# Patient Record
Sex: Male | Born: 1935 | Race: White | Hispanic: No | Marital: Married | Smoking: Never smoker
Health system: Southern US, Community
[De-identification: ages and names within clinical notes are randomized; demographics above are authoritative.]

## PROBLEM LIST (undated history)

## (undated) DIAGNOSIS — I10 Essential (primary) hypertension: Secondary | ICD-10-CM

## (undated) DIAGNOSIS — H9190 Unspecified hearing loss, unspecified ear: Secondary | ICD-10-CM

## (undated) DIAGNOSIS — I499 Cardiac arrhythmia, unspecified: Secondary | ICD-10-CM

## (undated) DIAGNOSIS — I4891 Unspecified atrial fibrillation: Secondary | ICD-10-CM

## (undated) DIAGNOSIS — T4145XA Adverse effect of unspecified anesthetic, initial encounter: Secondary | ICD-10-CM

## (undated) DIAGNOSIS — K4090 Unilateral inguinal hernia, without obstruction or gangrene, not specified as recurrent: Secondary | ICD-10-CM

## (undated) DIAGNOSIS — I482 Chronic atrial fibrillation, unspecified: Secondary | ICD-10-CM

## (undated) DIAGNOSIS — Z950 Presence of cardiac pacemaker: Secondary | ICD-10-CM

## (undated) DIAGNOSIS — I739 Peripheral vascular disease, unspecified: Secondary | ICD-10-CM

## (undated) DIAGNOSIS — H3530 Unspecified macular degeneration: Secondary | ICD-10-CM

## (undated) DIAGNOSIS — R351 Nocturia: Secondary | ICD-10-CM

## (undated) DIAGNOSIS — C61 Malignant neoplasm of prostate: Secondary | ICD-10-CM

## (undated) HISTORY — DX: Unspecified macular degeneration: H35.30

## (undated) HISTORY — PX: OTHER SURGICAL HISTORY: SHX169

## (undated) HISTORY — DX: Peripheral vascular disease, unspecified: I73.9

## (undated) HISTORY — PX: TONSILLECTOMY: SUR1361

## (undated) HISTORY — DX: Chronic atrial fibrillation, unspecified: I48.20

## (undated) HISTORY — PX: HERNIA REPAIR: SHX51

## (undated) HISTORY — DX: Malignant neoplasm of prostate: C61

## (undated) HISTORY — DX: Essential (primary) hypertension: I10

## (undated) HISTORY — PX: CATARACT EXTRACTION: SUR2

## (undated) HISTORY — PX: CARDIOVERSION: SHX1299

## (undated) HISTORY — DX: Nocturia: R35.1

## (undated) HISTORY — DX: Unilateral inguinal hernia, without obstruction or gangrene, not specified as recurrent: K40.90

---
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
## 1958-03-17 HISTORY — PX: THYROIDECTOMY, PARTIAL: SHX18

## 1988-03-17 DIAGNOSIS — I10 Essential (primary) hypertension: Secondary | ICD-10-CM

## 1988-03-17 HISTORY — DX: Essential (primary) hypertension: I10

## 1996-03-17 DIAGNOSIS — T8859XA Other complications of anesthesia, initial encounter: Secondary | ICD-10-CM | POA: Diagnosis present

## 1996-03-17 DIAGNOSIS — C61 Malignant neoplasm of prostate: Secondary | ICD-10-CM

## 1996-03-17 HISTORY — DX: Other complications of anesthesia, initial encounter: T88.59XA

## 1996-03-17 HISTORY — DX: Malignant neoplasm of prostate: C61

## 2005-11-26 ENCOUNTER — Ambulatory Visit (HOSPITAL_COMMUNITY): Admission: RE | Admit: 2005-11-26 | Discharge: 2005-11-26 | Payer: Self-pay | Admitting: Ophthalmology

## 2008-08-15 ENCOUNTER — Emergency Department (HOSPITAL_COMMUNITY): Admission: EM | Admit: 2008-08-15 | Discharge: 2008-08-15 | Payer: Self-pay | Admitting: Family Medicine

## 2009-07-26 HISTORY — PX: US ECHOCARDIOGRAPHY: HXRAD669

## 2009-08-14 ENCOUNTER — Inpatient Hospital Stay (HOSPITAL_COMMUNITY): Admission: RE | Admit: 2009-08-14 | Discharge: 2009-08-15 | Payer: Self-pay | Admitting: Cardiology

## 2009-08-14 HISTORY — PX: INSERT / REPLACE / REMOVE PACEMAKER: SUR710

## 2009-12-01 ENCOUNTER — Encounter: Payer: Self-pay | Admitting: Internal Medicine

## 2009-12-05 ENCOUNTER — Ambulatory Visit: Payer: Self-pay | Admitting: Cardiology

## 2009-12-20 ENCOUNTER — Ambulatory Visit: Payer: Self-pay | Admitting: Cardiology

## 2010-04-16 NOTE — Miscellaneous (Signed)
Summary: Device preload  Clinical Lists Changes  Observations: Added new observation of PPM INDICATN: Brady A-fib (12/01/2009 15:22) Added new observation of MAGNET RTE: BOL 85 ERI 65 (12/01/2009 15:22) Added new observation of PPMLEADSTAT1: active (12/01/2009 15:22) Added new observation of PPMLEADSER1: 016010  (12/01/2009 15:22) Added new observation of PPMLEADMOD1: 4471  (12/01/2009 15:22) Added new observation of PPMLEADDOI1: 08/14/2009  (12/01/2009 15:22) Added new observation of PPMLEADLOC1: RV  (12/01/2009 15:22) Added new observation of PPM IMP MD: Duffy Rhody Tennant,MD  (12/01/2009 15:22) Added new observation of PPM DOI: 08/14/2009  (12/01/2009 15:22) Added new observation of PPM SERL#: [id number]  (12/01/2009 15:22) Added new observation of PPM MODL#: [id number]  (12/01/2009 20:25) Added new observation of PACEMAKERMFG: Medtronic  (12/01/2009 15:22) Added new observation of PPM REFER MD: Duffy Rhody Tennant,MD  (12/01/2009 15:22) Added new observation of PACEMAKER MD: Sherryl Manges, MD  (12/01/2009 15:22)      PPM Specifications Following MD:  Sherryl Manges, MD     Referring MD:  Rolla Plate PPM Vendor:  Medtronic     PPM Model Number:  [id number]     PPM Serial Number:  [id number] PPM DOI:  08/14/2009     PPM Implanting MD:  Rolla Plate  Lead 1    Location: RV     DOI: 08/14/2009     Model #: [id number]     Serial #: [id number]     Status: active  Magnet Response Rate:  BOL 85 ERI 65  Indications:  Manuel Lewis

## 2010-06-03 LAB — SURGICAL PCR SCREEN: MRSA, PCR: NEGATIVE

## 2010-06-03 LAB — PROTIME-INR
INR: 1.43 (ref 0.00–1.49)
INR: 1.43 (ref 0.00–1.49)
Prothrombin Time: 17.3 seconds — ABNORMAL HIGH (ref 11.6–15.2)

## 2010-08-02 NOTE — Op Note (Signed)
NAME:  Manuel Lewis, Manuel Lewis               ACCOUNT NO.:  [id number]   MEDICAL RECORD NO.:  [id number]          PATIENT TYPE:  AMB   LOCATION:  SDS                          FACILITY:  [facility]   PHYSICIAN:  Alford Highland. Rankin, M.D.   DATE OF BIRTH:  04-15-1921   DATE OF PROCEDURE:  11/26/2005  DATE OF DISCHARGE:                                 OPERATIVE REPORT   PREOPERATIVE DIAGNOSIS:  Epiretinal membrane, right eye, with visually  significant symptoms.   POSTOPERATIVE DIAGNOSES:  1. Epiretinal membrane, right eye, with visually significant symptoms.  2. Asteroid hyalosis.  3. Posterior capsule opacity, right eye.   PROCEDURES:  1. Posterior vitrectomy with membrane peel - internal limiting membrane,      right eye, 25 gauge.  2. Surgical posterior capsulotomy, right eye.   SURGEON:  Alford Highland. Rankin, M.D.   ANESTHESIA:  Local retrobulbar with monitored anesthesia control.   INDICATIONS FOR PROCEDURE:  The patient is an 77 year old man with gradually  progressive epiretinal membrane, with macular topographic distortion and  inducement of visual acuity symptoms.  The patient understands this is an  attempt to remove the epiretinal membranes so as to allow the best visual  acuity improvement and enhancement of his vision.  The patient understands  the risks of anesthesia, including risks of death, and also those including  but not limited to from the surgical disease, surgical processes, as well as  underlying disease, hemorrhage, infection, scarring, need for further  surgery, no change in vision, loss of vision, progression of disease despite  intervention.  Appropriate signed consent was obtained.   DESCRIPTION OF PROCEDURE:  The patient was taken to the operating room.  In  the operating room, appropriate monitoring was followed by mild sedation.  Marcaine 0.75% was delivered, 5 cc retrobulbar, followed by an additional 5  cc, the latter in the fashion of a modified [employer]  The right  periocular  region was sterilely prepped and draped in the usual ophthalmic fashion.  A  lid speculum was applied.  A 25-gauge trocar system was used to place the  infusion.  Superior trocars were applied.  Core vitrectomy was then begun.  Physician-induced hyaloid detachment was necessary, as now the patient also  had mild asteroid.  Vitreous was then elevated anteriorly for 360 degrees.  No retinal holes or detachments were identified.  A 25-gauge forceps was  then used to engage the internal limiting membrane separately, which was  removed in a continuous sheet off the macular region without difficulty or  complication.  No complications occurred.  At this time, the peripheral  retina was inspected and found to be free of retinal holes or tears.  The  instruments were removed from the eye.  Vitreous herniation into the trocars  was carefully transected with the vitrectomy instrumentation.  The trocars  were removed.  Intraocular pressure was  assessed and found to be adequate.  The infusion was removed.  Subconjunctival injection of Decadron was applied.  The right periocular  region had a sterile patch and a Fox shield applied.  The patient was taken  to the PACU in good and stable condition.      Alford Highland Rankin, M.D.  Electronically Signed     GAR/MEDQ  D:  11/26/2005  T:  11/26/2005  Job:  [id number]   cc:   Etter Sjogren, M.D.

## 2010-08-05 ENCOUNTER — Encounter: Payer: Self-pay | Admitting: Internal Medicine

## 2010-08-06 ENCOUNTER — Ambulatory Visit (INDEPENDENT_AMBULATORY_CARE_PROVIDER_SITE_OTHER): Payer: Medicare Other | Admitting: Internal Medicine

## 2010-08-06 ENCOUNTER — Encounter: Payer: Self-pay | Admitting: Internal Medicine

## 2010-08-06 DIAGNOSIS — R001 Bradycardia, unspecified: Secondary | ICD-10-CM

## 2010-08-06 DIAGNOSIS — Z950 Presence of cardiac pacemaker: Secondary | ICD-10-CM | POA: Insufficient documentation

## 2010-08-06 DIAGNOSIS — I498 Other specified cardiac arrhythmias: Secondary | ICD-10-CM

## 2010-08-06 DIAGNOSIS — I10 Essential (primary) hypertension: Secondary | ICD-10-CM

## 2010-08-06 DIAGNOSIS — I4891 Unspecified atrial fibrillation: Secondary | ICD-10-CM

## 2010-08-06 NOTE — Patient Instructions (Signed)
Your physician recommends that you schedule a follow-up appointment in:  6 months pacer clinic  Your physician wants you to follow-up in:  MARCH 2013 with Dr.Klein for pacer check You will receive a reminder letter in the mail two months in advance. If you don't receive a letter, please call our office to schedule the follow-up appointment.

## 2010-08-06 NOTE — Assessment & Plan Note (Signed)
The patient's device was interrogated.  The information was reviewed. No changes were made in the programming.    

## 2010-08-06 NOTE — Assessment & Plan Note (Signed)
95% ventricularly paced; he is actually in the VVI mode quite surprisingly. He has no limitations of activity

## 2010-08-06 NOTE — Assessment & Plan Note (Signed)
Permanent on anticoagulation 

## 2010-08-06 NOTE — Assessment & Plan Note (Signed)
Markedly elevated today on both initial check and recheck. According to him and his wife this is a normal as. He is to see his PCP next week. They will record blood pressures in the interim.

## 2010-08-06 NOTE — Progress Notes (Signed)
HPI: Manuel Lewis is a 77 y.o. male Seen to establish pacemaker care for single-chamber device implanted in May 2011 for bradycardia in the setting of permanent atrial fibrillation. Thromboembolic risk factors are notable for age-89 and hypertension for a CHADS VASC score of 3.  He is on Coumadin The patient denies chest pain, shortness of breath, nocturnal dyspnea, orthopnea or peripheral edema.  There have been no palpitations, lightheadedness or syncope.   Current Outpatient Prescriptions  Medication Sig Dispense Refill  . acetaminophen (TYLENOL) 500 MG tablet Take 500 mg by mouth as needed.        Marland Kitchen amLODipine (NORVASC) 10 MG tablet Take 10 mg by mouth daily.        . Cholecalciferol (VITAMIN D) 400 UNITS capsule Take 400 Units by mouth daily.        Marland Kitchen docusate sodium (COLACE) 100 MG capsule 1 tab daily       . hydrochlorothiazide (,MICROZIDE/HYDRODIURIL,) 12.5 MG capsule Take 12.5 mg by mouth daily.        Marland Kitchen losartan (COZAAR) 100 MG tablet Take 100 mg by mouth daily.        . Multiple Vitamin (MULTIVITAMIN) tablet Take 1 tablet by mouth daily.        . Multiple Vitamins-Minerals (OCUVITE PRESERVISION) TABS Take 2 tablets by mouth daily.        . Warfarin Sodium (COUMADIN PO) Take 2 mg by mouth as directed.        Marland Kitchen DISCONTD: amLODipine-valsartan (EXFORGE) 10-320 MG per tablet Take 1 tablet by mouth daily.        Marland Kitchen DISCONTD: felodipine (PLENDIL) 10 MG 24 hr tablet Take 10 mg by mouth daily.        Marland Kitchen DISCONTD: lisinopril-hydrochlorothiazide (PRINZIDE,ZESTORETIC) 20-12.5 MG per tablet Take 1 tablet by mouth 2 (two) times daily.          Allergies no known allergies  Past Medical History  Diagnosis Date  . Nocturia   . Macular degeneration   . Hypertension 1990  . Chronic a-fib     since early 1990's with profound bradycardia and pauses; s/p  implantation  of Medtronic Adapta [id number], serial # [id number]  . Prostate cancer 1998    with seed implantation    Past Surgical History    Procedure Date  . Appendectomy 1941  . Hernia repair     1945 and 1985  . Thyroidectomy, partial 1960    benign nodule  . Cataract extraction     2000 and 2007/ h/o macular degeneration since 2009  . Cardioversion     IN THE 1990's  . Insert / replace / remove pacemaker 08/14/09    IMPLANTATION OF SINGLE CHAMBER PULSE  GENERATING SYSTEM;   MODEL ADSR01, SERIAL # [id number] H ADAPTA    Family History  Problem Relation Age of Onset  . Stroke Mother   . Kidney failure Father     History   Social History  . Marital Status: Married    Spouse Name: N/A    Number of Children: 1  . Years of Education: N/A   Occupational History  . RETIRED [employer]    ENGINEER   Social History Main Topics  . Smoking status: Never Smoker   . Smokeless tobacco: Not on file  . Alcohol Use: Yes     1 GLASS OF WINE NIGHTLY  . Drug Use: No  . Sexually Active:    Other Topics Concern  . Not on file   Social History Narrative  MARRIEDSON IS DR. 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   Well developed and nourished in no acute distress HENT normal Neck supple with JVP-flat Carotids brisk and full without bruits Back without scoliosis or kyphosis Clear Regular rate and rhythm, no murmurs or gallops Abd-soft with active BS without hepatomegaly or midline pulsation Femoral pulses 2+ distal pulses intact No Clubbing cyanosis edema Skin-warm and dry LN-neg submandibular and supraclavicular A & Oriented CN 3-12 normal  Grossly normal sensory and motor function Affect engaging .

## 2011-03-25 DIAGNOSIS — Z7901 Long term (current) use of anticoagulants: Secondary | ICD-10-CM | POA: Diagnosis not present

## 2011-04-22 DIAGNOSIS — Z7901 Long term (current) use of anticoagulants: Secondary | ICD-10-CM | POA: Diagnosis not present

## 2011-04-22 DIAGNOSIS — M949 Disorder of cartilage, unspecified: Secondary | ICD-10-CM | POA: Diagnosis not present

## 2011-04-22 DIAGNOSIS — M899 Disorder of bone, unspecified: Secondary | ICD-10-CM | POA: Diagnosis not present

## 2011-04-25 DIAGNOSIS — Z7901 Long term (current) use of anticoagulants: Secondary | ICD-10-CM | POA: Diagnosis not present

## 2011-05-02 DIAGNOSIS — Z7901 Long term (current) use of anticoagulants: Secondary | ICD-10-CM | POA: Diagnosis not present

## 2011-05-14 DIAGNOSIS — H353290 Exudative age-related macular degeneration, unspecified eye, stage unspecified: Secondary | ICD-10-CM | POA: Diagnosis not present

## 2011-05-14 DIAGNOSIS — H35359 Cystoid macular degeneration, unspecified eye: Secondary | ICD-10-CM | POA: Diagnosis not present

## 2011-05-14 DIAGNOSIS — H35059 Retinal neovascularization, unspecified, unspecified eye: Secondary | ICD-10-CM | POA: Diagnosis not present

## 2011-05-16 DIAGNOSIS — Z7901 Long term (current) use of anticoagulants: Secondary | ICD-10-CM | POA: Diagnosis not present

## 2011-05-30 DIAGNOSIS — Z7901 Long term (current) use of anticoagulants: Secondary | ICD-10-CM | POA: Diagnosis not present

## 2011-06-03 ENCOUNTER — Encounter: Payer: Self-pay | Admitting: *Deleted

## 2011-06-04 ENCOUNTER — Encounter: Payer: Self-pay | Admitting: Internal Medicine

## 2011-06-04 ENCOUNTER — Ambulatory Visit (INDEPENDENT_AMBULATORY_CARE_PROVIDER_SITE_OTHER): Payer: Medicare Other | Admitting: Internal Medicine

## 2011-06-04 VITALS — BP 169/95 | HR 59 | Ht 70.0 in | Wt 160.0 lb

## 2011-06-04 DIAGNOSIS — Z950 Presence of cardiac pacemaker: Secondary | ICD-10-CM | POA: Diagnosis not present

## 2011-06-04 DIAGNOSIS — I498 Other specified cardiac arrhythmias: Secondary | ICD-10-CM | POA: Diagnosis not present

## 2011-06-04 DIAGNOSIS — I10 Essential (primary) hypertension: Secondary | ICD-10-CM

## 2011-06-04 DIAGNOSIS — R001 Bradycardia, unspecified: Secondary | ICD-10-CM

## 2011-06-04 DIAGNOSIS — I4891 Unspecified atrial fibrillation: Secondary | ICD-10-CM

## 2011-06-04 LAB — PACEMAKER DEVICE OBSERVATION
BMOD-0003RV: 30
BMOD-0005RV: 95 {beats}/min
RV LEAD AMPLITUDE: 11.2 mv
RV LEAD IMPEDENCE PM: 563 Ohm
VENTRICULAR PACING PM: 95

## 2011-06-04 NOTE — Assessment & Plan Note (Signed)
95% ventricularly paced with a blunted heart rate histogram; however, the patient is not inclined to haveus  Reprogram his device

## 2011-06-04 NOTE — Progress Notes (Signed)
  HPI  Manuel Lewis is a 78 y.o. male Seen to establish pacemaker care for single-chamber device implanted in May 2011 for bradycardia in the setting of permanent atrial fibrillation. Thromboembolic risk factors are notable for age-4 and hypertension for a CHADS VASC score of 3. She is on Coumadin   Past Medical History  Diagnosis Date  . Nocturia   . Macular degeneration   . Hypertension 1990  . Chronic a-fib     since early 1990's with profound bradycardia and pauses; s/p  implantation  of Medtronic Adapta [id number], serial # [id number]  . Prostate cancer 1998    with seed implantation    Past Surgical History  Procedure Date  . Appendectomy 1941  . Hernia repair     1945 and 1985  . Thyroidectomy, partial 1960    benign nodule  . Cataract extraction     2000 and 2007/ h/o macular degeneration since 2009  . Cardioversion     IN THE 1990's  . Insert / replace / remove pacemaker 08/14/09    IMPLANTATION OF SINGLE CHAMBER PULSE  GENERATING SYSTEM;   MODEL ADSR01, SERIAL # [id number] H ADAPTA  . US echocardiography 07/26/2009    EF 55-60%    Current Outpatient Prescriptions  Medication Sig Dispense Refill  . acetaminophen (TYLENOL) 500 MG tablet Take 500 mg by mouth as needed.        Marland Kitchen amLODipine (NORVASC) 10 MG tablet Take 10 mg by mouth daily.        . Cholecalciferol (VITAMIN D) 400 UNITS capsule Take 400 Units by mouth daily.        Marland Kitchen docusate sodium (COLACE) 100 MG capsule 1 tab daily       . hydrochlorothiazide (,MICROZIDE/HYDRODIURIL,) 12.5 MG capsule Take 12.5 mg by mouth daily.        Marland Kitchen losartan (COZAAR) 100 MG tablet Take 100 mg by mouth daily.        . Multiple Vitamin (MULTIVITAMIN) tablet Take 1 tablet by mouth daily.        . Multiple Vitamins-Minerals (OCUVITE PRESERVISION) TABS Take 2 tablets by mouth daily.        . Warfarin Sodium (COUMADIN PO) Take 2 mg by mouth as directed.          Allergies no known allergies  Review of Systems negative except from HPI  and PMH  Physical Exam BP 169/95  Pulse 59  Ht 5\' 10"  (1.778 m)  Wt 160 lb (72.576 kg)  BMI 22.96 kg/m2 Well developed and well nourished in no acute distress HENT normal E scleral and icterus clear Neck Supple JVP flat; carotids brisk and full Clear to ausculation Regular rate and rhythm, no murmurs gallops or rub Soft with active bowel sounds No clubbing cyanosis none Edema Alert and oriented, grossly normal motor and sensory function Skin Warm and Dry   Assessment and  Plan

## 2011-06-04 NOTE — Patient Instructions (Signed)
Your physician wants you to follow-up in: 6 months with Kristin/Paula for a device check & 1 year with Dr. Klein. You will receive a reminder letter in the mail two months in advance. If you don't receive a letter, please call our office to schedule the follow-up appointment.  Your physician recommends that you continue on your current medications as directed. Please refer to the Current Medication list given to you today.  

## 2011-06-04 NOTE — Assessment & Plan Note (Addendum)
permanent  on coumadin

## 2011-06-04 NOTE — Assessment & Plan Note (Signed)
He says that this is whitecoat hypertension; I've asked that the track her blood pressures at home and being follow up with her PCP if systolics are ranging over 140

## 2011-06-26 DIAGNOSIS — Z7901 Long term (current) use of anticoagulants: Secondary | ICD-10-CM | POA: Diagnosis not present

## 2011-07-09 DIAGNOSIS — C44319 Basal cell carcinoma of skin of other parts of face: Secondary | ICD-10-CM | POA: Diagnosis not present

## 2011-07-18 DIAGNOSIS — C44319 Basal cell carcinoma of skin of other parts of face: Secondary | ICD-10-CM | POA: Diagnosis not present

## 2011-07-18 DIAGNOSIS — D210 Benign neoplasm of connective and other soft tissue of head, face and neck: Secondary | ICD-10-CM | POA: Diagnosis not present

## 2011-07-24 DIAGNOSIS — Z7901 Long term (current) use of anticoagulants: Secondary | ICD-10-CM | POA: Diagnosis not present

## 2011-07-28 ENCOUNTER — Telehealth (INDEPENDENT_AMBULATORY_CARE_PROVIDER_SITE_OTHER): Payer: Self-pay | Admitting: General Surgery

## 2011-07-28 NOTE — Telephone Encounter (Signed)
Pt's wife calling to ask if possible to be seen sooner than appt on 09/01/11, and husband is having increasing pain with Lt. Inguinal hernia.  Please call them is something becomes available.

## 2011-07-31 DIAGNOSIS — Z7901 Long term (current) use of anticoagulants: Secondary | ICD-10-CM | POA: Diagnosis not present

## 2011-08-06 ENCOUNTER — Encounter (INDEPENDENT_AMBULATORY_CARE_PROVIDER_SITE_OTHER): Payer: Self-pay | Admitting: General Surgery

## 2011-08-06 ENCOUNTER — Ambulatory Visit (INDEPENDENT_AMBULATORY_CARE_PROVIDER_SITE_OTHER): Payer: Medicare Other | Admitting: General Surgery

## 2011-08-06 VITALS — BP 168/72 | HR 72 | Temp 97.2°F | Resp 18 | Ht 70.0 in | Wt 162.0 lb

## 2011-08-06 DIAGNOSIS — K4021 Bilateral inguinal hernia, without obstruction or gangrene, recurrent: Secondary | ICD-10-CM | POA: Diagnosis not present

## 2011-08-06 NOTE — Patient Instructions (Signed)
We will call you after we have talked with Onalee Hua and Dr. Graciela Husbands.  Avoid heavy or strenuous activities.

## 2011-08-06 NOTE — Progress Notes (Signed)
Patient ID: Manuel Lewis, male   DOB: 11/28/1921, 78 y.o.   MRN: [id number]  No chief complaint on file.   HPI Manuel Lewis is a 78 y.o. male.   HPI  Manuel Lewis is here with his wife because his left inguinal hernia is becoming more for problem. He was doing a lot of chores around the house recently and had significant swelling and severe pain at the site of his recurrent left inguinal hernia. He went and laid down and the pain improved. He also has had a minor episode recently with the left side. The right side is asymptomatic. It is for this reason that he is coming to see me.  Past Medical History  Diagnosis Date  . Nocturia   . Macular degeneration   . Hypertension 1990  . Chronic a-fib     since early 1990's with profound bradycardia and pauses; s/p  implantation  of Medtronic Adapta ADSR01, serial # [id number]  . Prostate cancer 1998    with seed implantation    Past Surgical History  Procedure Date  . Appendectomy 1941  . Hernia repair     1945 and 1985  . Thyroidectomy, partial 1960    benign nodule  . Cataract extraction     2000 and 2007/ h/o macular degeneration since 2009  . Cardioversion     IN THE 1990's  . Insert / replace / remove pacemaker 08/14/09    IMPLANTATION OF SINGLE CHAMBER PULSE  GENERATING SYSTEM;   MODEL ADSR01, SERIAL # [id number] ADAPTA  . Us echocardiography 07/26/2009    EF 55-60%    Family History  Problem Relation Age of Onset  . Stroke Mother   . Kidney failure Father     Social History History  Substance Use Topics  . Smoking status: Never Smoker   . Smokeless tobacco: Not on file  . Alcohol Use: Yes     1 GLASS OF WINE NIGHTLY    No Known Allergies  Current Outpatient Prescriptions  Medication Sig Dispense Refill  . amLODipine (NORVASC) 10 MG tablet Take 10 mg by mouth daily.        . Cholecalciferol (VITAMIN D) 400 UNITS capsule Take 400 Units by mouth daily.        . hydrochlorothiazide (,MICROZIDE/HYDRODIURIL,) 12.5 MG  capsule Take 12.5 mg by mouth daily.        . Multiple Vitamin (MULTIVITAMIN) tablet Take 1 tablet by mouth daily.        . Warfarin Sodium (COUMADIN PO) Take 2 mg by mouth as directed.        . acetaminophen (TYLENOL) 500 MG tablet Take 500 mg by mouth as needed.        . docusate sodium (COLACE) 100 MG capsule 1 tab daily       . losartan (COZAAR) 100 MG tablet Take 100 mg by mouth daily.        . Multiple Vitamins-Minerals (OCUVITE PRESERVISION) TABS Take 2 tablets by mouth daily.          Review of Systems Review of Systems  HENT: Positive for hearing loss.   Cardiovascular: Negative for chest pain.  Gastrointestinal: Negative for constipation.  Genitourinary: Positive for difficulty urinating.       Nocturia- 3 to 4 times a night.    Blood pressure 168/72, pulse 72, temperature 97.2 F (36.2 C), temperature source Temporal, resp. rate 18, height 5' 10" (1.778 m), weight 162 lb (73.483 kg).  Physical Exam Physical Exam    Constitutional:       Thin, elderly male in NAD.  HENT:  Head: Normocephalic and atraumatic.       Has hearing aid  Cardiovascular:       Slow rate with intermittent irregular rhythm  Abdominal: Soft. There is no tenderness.       No umbilical hernia.  Genitourinary:       Bilateral groin scars and bulges-left greater than right- that are reducible in the supine position.  Musculoskeletal: He exhibits no edema.  Skin: Skin is warm and dry.  Psychiatric: He has a normal mood and affect. His behavior is normal.    Data Reviewed Old Chart  Assessment    Increasingly symptomatic recurrent left inguinal hernia.  The recurrent right inguinal hernia is not bothering him.    Plan    I suggested that he seriously consider repair of the recurrent left inguinal hernia with mesh using local anesthesia and sedation.  I have explained the procedure, risks, and aftercare of inguinal hernia repair.  Risks include but are not limited to bleeding, infection, wound  problems, anesthesia, recurrence, bladder or intestine injury, urinary retention, testicular dysfunction, chronic pain, mesh problems.  He seems to understand.  I will discuss this with his son and Dr. Klein and then call him back.       Christain Niznik J 08/06/2011, 9:09 AM    

## 2011-08-07 ENCOUNTER — Encounter (INDEPENDENT_AMBULATORY_CARE_PROVIDER_SITE_OTHER): Payer: Self-pay | Admitting: General Surgery

## 2011-08-07 ENCOUNTER — Other Ambulatory Visit (INDEPENDENT_AMBULATORY_CARE_PROVIDER_SITE_OTHER): Payer: Self-pay | Admitting: General Surgery

## 2011-08-07 NOTE — Progress Notes (Signed)
Patient ID: Manuel Lewis, male   DOB: 1922/03/11, 78 y.o.   MRN: [id number] I spoke with Dr. Graciela Husbands and he stated his Coumadin could be stopped as needed and restarted as soon as possible after the surgery. I spoke with his son Dr. Etter Lewis and he was in agreement with my recommendation to repair of his symptomatic recurrent left inguinal hernia.  I then spoke with Manuel Lewis himself and he would like to proceed with the operation. I've asked him to stop his Coumadin 5 days prior to the surgery. We will work on  getting the operation scheduled.

## 2011-08-08 ENCOUNTER — Encounter (HOSPITAL_COMMUNITY): Payer: Self-pay | Admitting: Pharmacy Technician

## 2011-08-13 ENCOUNTER — Encounter (HOSPITAL_COMMUNITY): Payer: Self-pay

## 2011-08-13 ENCOUNTER — Inpatient Hospital Stay (HOSPITAL_COMMUNITY): Admission: RE | Admit: 2011-08-13 | Discharge: 2011-08-13 | Payer: BC Managed Care – PPO | Source: Ambulatory Visit

## 2011-08-13 ENCOUNTER — Other Ambulatory Visit (HOSPITAL_COMMUNITY): Payer: BC Managed Care – PPO

## 2011-08-13 DIAGNOSIS — H353290 Exudative age-related macular degeneration, unspecified eye, stage unspecified: Secondary | ICD-10-CM | POA: Diagnosis not present

## 2011-08-13 DIAGNOSIS — H35059 Retinal neovascularization, unspecified, unspecified eye: Secondary | ICD-10-CM | POA: Diagnosis not present

## 2011-08-13 HISTORY — DX: Presence of cardiac pacemaker: Z95.0

## 2011-08-13 HISTORY — DX: Adverse effect of unspecified anesthetic, initial encounter: T41.45XA

## 2011-08-13 NOTE — Pre-Procedure Instructions (Signed)
[address]  08/13/2011   Your procedure is scheduled on:  Monday June 3  Report to [facility] at 9:30 AM.  Call this number if you have problems the morning of surgery: [phone]   Remember:   Do not eat food:After Midnight.  May have clear liquids: up to 4 Hours before arrival.  Clear liquids include soda, tea, black coffee, apple or grape juice, broth.  Take these medicines the morning of surgery with A SIP OF WATER: Amlodipine   Do not wear jewelry, make-up or nail polish.  Do not wear lotions, powders, or perfumes. You may wear deodorant.  Do not shave 48 hours prior to surgery. Men may shave face and neck.  Do not bring valuables to the hospital.  Contacts, dentures or bridgework may not be worn into surgery.  Leave suitcase in the car. After surgery it may be brought to your room.  For patients admitted to the hospital, checkout time is 11:00 AM the day of discharge.   Patients discharged the day of surgery will not be allowed to drive home.  Name and phone number of your driver: NA  Special Instructions: CHG Shower Use Special Wash: 1/2 bottle night before surgery and 1/2 bottle morning of surgery.   Please read over the following fact sheets that you were given: Pain Booklet, Coughing and Deep Breathing and Surgical Site Infection Prevention

## 2011-08-14 ENCOUNTER — Encounter (HOSPITAL_COMMUNITY)
Admission: RE | Admit: 2011-08-14 | Discharge: 2011-08-14 | Disposition: A | Discharge status: Home / Self Care | Payer: Medicare Other | Source: Ambulatory Visit | Attending: General Surgery | Admitting: General Surgery

## 2011-08-14 ENCOUNTER — Other Ambulatory Visit (HOSPITAL_COMMUNITY): Payer: BC Managed Care – PPO

## 2011-08-14 DIAGNOSIS — Z8546 Personal history of malignant neoplasm of prostate: Secondary | ICD-10-CM | POA: Diagnosis not present

## 2011-08-14 DIAGNOSIS — K4091 Unilateral inguinal hernia, without obstruction or gangrene, recurrent: Secondary | ICD-10-CM | POA: Diagnosis not present

## 2011-08-14 DIAGNOSIS — I10 Essential (primary) hypertension: Secondary | ICD-10-CM | POA: Diagnosis not present

## 2011-08-14 DIAGNOSIS — I4891 Unspecified atrial fibrillation: Secondary | ICD-10-CM | POA: Diagnosis not present

## 2011-08-14 DIAGNOSIS — Z01812 Encounter for preprocedural laboratory examination: Secondary | ICD-10-CM | POA: Diagnosis not present

## 2011-08-14 DIAGNOSIS — Z950 Presence of cardiac pacemaker: Secondary | ICD-10-CM | POA: Diagnosis not present

## 2011-08-14 DIAGNOSIS — Z01811 Encounter for preprocedural respiratory examination: Secondary | ICD-10-CM | POA: Diagnosis not present

## 2011-08-14 LAB — CBC
MCV: 94.5 fL (ref 78.0–100.0)
Platelets: 215 10*3/uL (ref 150–400)
RBC: 4.16 MIL/uL — ABNORMAL LOW (ref 4.22–5.81)
WBC: 5.6 10*3/uL (ref 4.0–10.5)

## 2011-08-14 LAB — COMPREHENSIVE METABOLIC PANEL
ALT: 17 U/L (ref 0–53)
AST: 28 U/L (ref 0–37)
Alkaline Phosphatase: 55 U/L (ref 39–117)
CO2: 25 mEq/L (ref 19–32)
Chloride: 101 mEq/L (ref 96–112)
Creatinine, Ser: 1.11 mg/dL (ref 0.50–1.35)
GFR calc non Af Amer: 57 mL/min — ABNORMAL LOW (ref >90)
Potassium: 3.5 mEq/L (ref 3.5–5.1)
Total Bilirubin: 0.8 mg/dL (ref 0.3–1.2)

## 2011-08-14 LAB — PROTIME-INR: INR: 3.33 — ABNORMAL HIGH (ref 0.00–1.49)

## 2011-08-17 MED ORDER — CEFAZOLIN SODIUM 1-5 GM-% IV SOLN
1.0000 g | INTRAVENOUS | Status: AC
  Administered 2011-08-18: 1 g via INTRAVENOUS
  Filled 2011-08-17: qty 50

## 2011-08-18 ENCOUNTER — Encounter (HOSPITAL_COMMUNITY): Payer: Self-pay | Admitting: Anesthesiology

## 2011-08-18 ENCOUNTER — Ambulatory Visit (HOSPITAL_COMMUNITY)
Admission: RE | Admit: 2011-08-18 | Discharge: 2011-08-19 | Disposition: A | Discharge status: Home / Self Care | Payer: Medicare Other | Source: Ambulatory Visit | Attending: General Surgery | Admitting: General Surgery

## 2011-08-18 ENCOUNTER — Ambulatory Visit (HOSPITAL_COMMUNITY): Payer: Medicare Other | Admitting: Anesthesiology

## 2011-08-18 ENCOUNTER — Encounter (HOSPITAL_COMMUNITY)
Admission: RE | Disposition: A | Discharge status: Home / Self Care | Payer: Self-pay | Source: Ambulatory Visit | Attending: General Surgery

## 2011-08-18 ENCOUNTER — Encounter (HOSPITAL_COMMUNITY): Payer: Self-pay | Admitting: *Deleted

## 2011-08-18 DIAGNOSIS — K4091 Unilateral inguinal hernia, without obstruction or gangrene, recurrent: Secondary | ICD-10-CM | POA: Insufficient documentation

## 2011-08-18 DIAGNOSIS — Z01812 Encounter for preprocedural laboratory examination: Secondary | ICD-10-CM | POA: Insufficient documentation

## 2011-08-18 DIAGNOSIS — I4891 Unspecified atrial fibrillation: Secondary | ICD-10-CM | POA: Insufficient documentation

## 2011-08-18 DIAGNOSIS — I10 Essential (primary) hypertension: Secondary | ICD-10-CM | POA: Insufficient documentation

## 2011-08-18 DIAGNOSIS — Z950 Presence of cardiac pacemaker: Secondary | ICD-10-CM | POA: Insufficient documentation

## 2011-08-18 DIAGNOSIS — K4090 Unilateral inguinal hernia, without obstruction or gangrene, not specified as recurrent: Secondary | ICD-10-CM | POA: Diagnosis not present

## 2011-08-18 DIAGNOSIS — Z8546 Personal history of malignant neoplasm of prostate: Secondary | ICD-10-CM | POA: Insufficient documentation

## 2011-08-18 HISTORY — PX: HERNIA REPAIR: SHX51

## 2011-08-18 HISTORY — PX: INGUINAL HERNIA REPAIR: SHX194

## 2011-08-18 SURGERY — REPAIR, HERNIA, INGUINAL, ADULT
Anesthesia: Monitor Anesthesia Care | Site: Groin | Laterality: Left | Wound class: Clean

## 2011-08-18 MED ORDER — LOSARTAN POTASSIUM 50 MG PO TABS: 100.0000 mg | ORAL_TABLET | Freq: Every day | ORAL | Status: DC

## 2011-08-18 MED ORDER — LACTATED RINGERS IV SOLN
INTRAVENOUS | Status: DC | PRN
  Administered 2011-08-18: 11:00:00 via INTRAVENOUS

## 2011-08-18 MED ORDER — PROPOFOL 10 MG/ML IV EMUL
INTRAVENOUS | Status: DC | PRN
  Administered 2011-08-18: 100 ug/kg/min via INTRAVENOUS

## 2011-08-18 MED ORDER — ACETAMINOPHEN 10 MG/ML IV SOLN
INTRAVENOUS | Status: AC
  Filled 2011-08-18: qty 100

## 2011-08-18 MED ORDER — ACETAMINOPHEN 10 MG/ML IV SOLN
1000.0000 mg | Freq: Once | INTRAVENOUS | Status: AC
  Administered 2011-08-18: 1000 mg via INTRAVENOUS
  Filled 2011-08-18: qty 100

## 2011-08-18 MED ORDER — HYDROMORPHONE HCL PF 1 MG/ML IJ SOLN: 0.2500 mg | INTRAMUSCULAR | Status: DC | PRN

## 2011-08-18 MED ORDER — TAMSULOSIN HCL 0.4 MG PO CAPS
0.4000 mg | ORAL_CAPSULE | Freq: Every day | ORAL | Status: DC
  Administered 2011-08-18 – 2011-08-19 (×2): 0.4 mg via ORAL
  Filled 2011-08-18 (×2): qty 1

## 2011-08-18 MED ORDER — FENTANYL CITRATE 0.05 MG/ML IJ SOLN
INTRAMUSCULAR | Status: DC | PRN
  Administered 2011-08-18 (×2): 50 ug via INTRAVENOUS

## 2011-08-18 MED ORDER — LACTATED RINGERS IV SOLN
INTRAVENOUS | Status: DC
  Administered 2011-08-18: 11:00:00 via INTRAVENOUS

## 2011-08-18 MED ORDER — ONDANSETRON HCL 4 MG/2ML IJ SOLN
INTRAMUSCULAR | Status: DC | PRN
  Administered 2011-08-18: 4 mg via INTRAVENOUS

## 2011-08-18 MED ORDER — HYDROCHLOROTHIAZIDE 12.5 MG PO CAPS
12.5000 mg | ORAL_CAPSULE | Freq: Every day | ORAL | Status: DC
  Administered 2011-08-18 – 2011-08-19 (×2): 12.5 mg via ORAL
  Filled 2011-08-18 (×2): qty 1

## 2011-08-18 MED ORDER — HYDROCODONE-ACETAMINOPHEN 5-325 MG PO TABS: 1.0000 | ORAL_TABLET | ORAL | Status: DC | PRN

## 2011-08-18 MED ORDER — DOCUSATE SODIUM 100 MG PO CAPS
100.0000 mg | ORAL_CAPSULE | Freq: Every day | ORAL | Status: DC
  Administered 2011-08-19: 100 mg via ORAL
  Filled 2011-08-18: qty 1

## 2011-08-18 MED ORDER — LOSARTAN POTASSIUM 50 MG PO TABS
100.0000 mg | ORAL_TABLET | Freq: Every day | ORAL | Status: DC
  Administered 2011-08-18 – 2011-08-19 (×2): 100 mg via ORAL
  Filled 2011-08-18 (×2): qty 2

## 2011-08-18 MED ORDER — MORPHINE SULFATE 2 MG/ML IJ SOLN: 1.0000 mg | INTRAMUSCULAR | Status: DC | PRN

## 2011-08-18 MED ORDER — LIDOCAINE HCL (CARDIAC) 20 MG/ML IV SOLN
INTRAVENOUS | Status: DC | PRN
  Administered 2011-08-18: 40 mg via INTRAVENOUS

## 2011-08-18 MED ORDER — LIDOCAINE-EPINEPHRINE (PF) 1 %-1:200000 IJ SOLN
INTRAMUSCULAR | Status: DC | PRN
  Administered 2011-08-18: 13:00:00

## 2011-08-18 MED ORDER — 0.9 % SODIUM CHLORIDE (POUR BTL) OPTIME
TOPICAL | Status: DC | PRN
  Administered 2011-08-18: 1000 mL

## 2011-08-18 MED ORDER — AMLODIPINE BESYLATE 10 MG PO TABS
10.0000 mg | ORAL_TABLET | Freq: Every day | ORAL | Status: DC
  Administered 2011-08-19: 10 mg via ORAL
  Filled 2011-08-18: qty 1

## 2011-08-18 SURGICAL SUPPLY — 47 items
APL SKNCLS STERI-STRIP NONHPOA (GAUZE/BANDAGES/DRESSINGS) ×1
BENZOIN TINCTURE PRP APPL 2/3 (GAUZE/BANDAGES/DRESSINGS) ×2 IMPLANT
BLADE SURG 10 STRL SS (BLADE) ×2 IMPLANT
BLADE SURG 15 STRL LF DISP TIS (BLADE) ×1 IMPLANT
BLADE SURG 15 STRL SS (BLADE) ×2
BLADE SURG ROTATE 9660 (MISCELLANEOUS) ×1 IMPLANT
CHLORAPREP W/TINT 26ML (MISCELLANEOUS) ×2 IMPLANT
CLOTH BEACON ORANGE TIMEOUT ST (SAFETY) ×2 IMPLANT
COVER SURGICAL LIGHT HANDLE (MISCELLANEOUS) ×2 IMPLANT
DRAIN PENROSE 1/2X12 LTX STRL (WOUND CARE) ×1 IMPLANT
DRAPE INCISE IOBAN 66X45 STRL (DRAPES) ×2 IMPLANT
DRAPE LAPAROTOMY TRNSV 102X78 (DRAPE) ×2 IMPLANT
DRESSING TELFA 8X3 (GAUZE/BANDAGES/DRESSINGS) ×2 IMPLANT
DRSG OPSITE 4X5.5 SM (GAUZE/BANDAGES/DRESSINGS) ×1 IMPLANT
DRSG OPSITE 6X11 MED (GAUZE/BANDAGES/DRESSINGS) ×2 IMPLANT
ELECT CAUTERY BLADE 6.4 (BLADE) ×2 IMPLANT
ELECT REM PT RETURN 9FT ADLT (ELECTROSURGICAL) ×2
ELECTRODE REM PT RTRN 9FT ADLT (ELECTROSURGICAL) ×1 IMPLANT
GLOVE BIOGEL PI IND STRL 7.0 (GLOVE) IMPLANT
GLOVE BIOGEL PI IND STRL 8 (GLOVE) ×1 IMPLANT
GLOVE BIOGEL PI INDICATOR 7.0 (GLOVE) ×2
GLOVE BIOGEL PI INDICATOR 8 (GLOVE) ×1
GLOVE ECLIPSE 8.0 STRL XLNG CF (GLOVE) ×2 IMPLANT
GLOVE SURG SS PI 7.0 STRL IVOR (GLOVE) ×2 IMPLANT
GOWN STRL NON-REIN LRG LVL3 (GOWN DISPOSABLE) ×4 IMPLANT
KIT BASIN OR (CUSTOM PROCEDURE TRAY) ×2 IMPLANT
KIT ROOM TURNOVER OR (KITS) ×2 IMPLANT
MESH HERNIA 3X6 (Mesh General) ×1 IMPLANT
NDL HYPO 25GX1X1/2 BEV (NEEDLE) ×1 IMPLANT
NEEDLE HYPO 25GX1X1/2 BEV (NEEDLE) ×2 IMPLANT
NS IRRIG 1000ML POUR BTL (IV SOLUTION) ×2 IMPLANT
PACK SURGICAL SETUP 50X90 (CUSTOM PROCEDURE TRAY) ×2 IMPLANT
PAD ARMBOARD 7.5X6 YLW CONV (MISCELLANEOUS) ×2 IMPLANT
PENCIL BUTTON HOLSTER BLD 10FT (ELECTRODE) ×2 IMPLANT
SPECIMEN JAR SMALL (MISCELLANEOUS) IMPLANT
SPONGE LAP 18X18 X RAY DECT (DISPOSABLE) ×2 IMPLANT
STRIP CLOSURE SKIN 1/2X4 (GAUZE/BANDAGES/DRESSINGS) ×2 IMPLANT
SUT MON AB 4-0 PC3 18 (SUTURE) ×2 IMPLANT
SUT PROLENE 2 0 CT2 30 (SUTURE) ×4 IMPLANT
SUT SILK 2 0 SH (SUTURE) IMPLANT
SUT VIC AB 2-0 SH 18 (SUTURE) ×2 IMPLANT
SUT VIC AB 3-0 SH 27 (SUTURE) ×4
SUT VIC AB 3-0 SH 27XBRD (SUTURE) ×1 IMPLANT
SUT VICRYL AB 3 0 TIES (SUTURE) ×2 IMPLANT
SYR CONTROL 10ML LL (SYRINGE) ×2 IMPLANT
TOWEL OR 17X24 6PK STRL BLUE (TOWEL DISPOSABLE) ×2 IMPLANT
TOWEL OR 17X26 10 PK STRL BLUE (TOWEL DISPOSABLE) ×2 IMPLANT

## 2011-08-18 NOTE — Preoperative (Signed)
Beta Blockers   Reason not to administer Beta Blockers:Not Applicable 

## 2011-08-18 NOTE — Transfer of Care (Signed)
Immediate Anesthesia Transfer of Care Note  Patient: Manuel Lewis  Procedure(s) Performed: Procedure(s) (LRB): HERNIA REPAIR INGUINAL ADULT (Left) INSERTION OF MESH (Left)  Patient Location: PACU  Anesthesia Type: MAC  Level of Consciousness: awake, oriented and patient cooperative  Airway & Oxygen Therapy: Patient Spontanous Breathing and Patient connected to face mask oxygen  Post-op Assessment: Report given to PACU RN and Post -op Vital signs reviewed and stable  Post vital signs: Reviewed and stable  Complications: No apparent anesthesia complications

## 2011-08-18 NOTE — Anesthesia Preprocedure Evaluation (Addendum)
Anesthesia Evaluation  Patient identified by MRN, date of birth, ID band Patient awake    Reviewed: Allergy & Precautions, H&P , NPO status , Patient's Chart, lab work & pertinent test results  Airway Mallampati: I TM Distance: >3 FB Neck ROM: Full    Dental No notable dental hx. (+) Teeth Intact and Dental Advisory Given   Pulmonary neg pulmonary ROS,  breath sounds clear to auscultation  Pulmonary exam normal       Cardiovascular hypertension, On Medications + dysrhythmias Atrial Fibrillation + pacemaker Rhythm:Regular Rate:Normal     Neuro/Psych negative neurological ROS  negative psych ROS   GI/Hepatic negative GI ROS, Neg liver ROS,   Endo/Other  negative endocrine ROS  Renal/GU negative Renal ROS  negative genitourinary   Musculoskeletal   Abdominal   Peds  Hematology negative hematology ROS (+)   Anesthesia Other Findings   Reproductive/Obstetrics negative OB ROS                           Anesthesia Physical Anesthesia Plan  ASA: III  Anesthesia Plan: MAC   Post-op Pain Management:    Induction: Intravenous  Airway Management Planned: Simple Face Mask  Additional Equipment:   Intra-op Plan:   Post-operative Plan:   Informed Consent: I have reviewed the patients History and Physical, chart, labs and discussed the procedure including the risks, benefits and alternatives for the proposed anesthesia with the patient or authorized representative who has indicated his/her understanding and acceptance.   Dental advisory given  Plan Discussed with: CRNA  Anesthesia Plan Comments:        Anesthesia Quick Evaluation

## 2011-08-18 NOTE — Progress Notes (Signed)
Pt ambulated 200 feet in hallway with RN; will cont. To monitor.

## 2011-08-18 NOTE — Progress Notes (Signed)
Spoke to Mathis , rep for [employer], stated of pt's arrival to short stay. She stated for anesthesia to call her if needed.

## 2011-08-18 NOTE — Progress Notes (Signed)
Paged [employer]ronic rep.

## 2011-08-18 NOTE — Anesthesia Postprocedure Evaluation (Signed)
  Anesthesia Post-op Note  Patient: Manuel Lewis  Procedure(s) Performed: Procedure(s) (LRB): HERNIA REPAIR INGUINAL ADULT (Left) INSERTION OF MESH (Left)  Patient Location: PACU  Anesthesia Type: MAC  Level of Consciousness: awake, alert  and oriented  Airway and Oxygen Therapy: Patient Spontanous Breathing  Post-op Pain: none  Post-op Assessment: Post-op Vital signs reviewed, Patient's Cardiovascular Status Stable, Respiratory Function Stable, Patent Airway and No signs of Nausea or vomiting  Post-op Vital Signs: Reviewed and stable  Complications: No apparent anesthesia complications

## 2011-08-18 NOTE — H&P (View-Only) (Signed)
Patient ID: Manuel Lewis, male   DOB: Dec 15, 1921, 78 y.o.   MRN: [id number]  No chief complaint on file.   HPI Manuel Lewis is a 78 y.o. male.   HPI  Manuel Lewis is here with his wife because his left inguinal hernia is becoming more for problem. He was doing a lot of chores around the house recently and had significant swelling and severe pain at the site of his recurrent left inguinal hernia. He went and laid down and the pain improved. He also has had a minor episode recently with the left side. The right side is asymptomatic. It is for this reason that he is coming to see me.  Past Medical History  Diagnosis Date  . Nocturia   . Macular degeneration   . Hypertension 1990  . Chronic a-fib     since early 1990's with profound bradycardia and pauses; s/p  implantation  of [employer] Adapta [id number], serial # [id number]  . Prostate cancer 1998    with seed implantation    Past Surgical History  Procedure Date  . Appendectomy 1941  . Hernia repair     1945 and 1985  . Thyroidectomy, partial 1960    benign nodule  . Cataract extraction     2000 and 2007/ h/o macular degeneration since 2009  . Cardioversion     IN THE 1990's  . Insert / replace / remove pacemaker 08/14/09    IMPLANTATION OF SINGLE CHAMBER PULSE  GENERATING SYSTEM;   MODEL ADSR01, SERIAL # [id number] H ADAPTA  . US echocardiography 07/26/2009    EF 55-60%    Family History  Problem Relation Age of Onset  . Stroke Mother   . Kidney failure Father     Social History History  Substance Use Topics  . Smoking status: Never Smoker   . Smokeless tobacco: Not on file  . Alcohol Use: Yes     1 GLASS OF WINE NIGHTLY    No Known Allergies  Current Outpatient Prescriptions  Medication Sig Dispense Refill  . amLODipine (NORVASC) 10 MG tablet Take 10 mg by mouth daily.        . Cholecalciferol (VITAMIN D) 400 UNITS capsule Take 400 Units by mouth daily.        . hydrochlorothiazide (,MICROZIDE/HYDRODIURIL,) 12.5 MG  capsule Take 12.5 mg by mouth daily.        . Multiple Vitamin (MULTIVITAMIN) tablet Take 1 tablet by mouth daily.        . Warfarin Sodium (COUMADIN PO) Take 2 mg by mouth as directed.        Marland Kitchen acetaminophen (TYLENOL) 500 MG tablet Take 500 mg by mouth as needed.        . docusate sodium (COLACE) 100 MG capsule 1 tab daily       . losartan (COZAAR) 100 MG tablet Take 100 mg by mouth daily.        . Multiple Vitamins-Minerals (OCUVITE PRESERVISION) TABS Take 2 tablets by mouth daily.          Review of Systems Review of Systems  HENT: Positive for hearing loss.   Cardiovascular: Negative for chest pain.  Gastrointestinal: Negative for constipation.  Genitourinary: Positive for difficulty urinating.       Nocturia- 3 to 4 times a night.    Blood pressure 168/72, pulse 72, temperature 97.2 F (36.2 C), temperature source Temporal, resp. rate 18, height 5\' 10"  (1.778 m), weight 162 lb (73.483 kg).  Physical Exam Physical Exam  Constitutional:       Thin, elderly male in NAD.  HENT:  Head: Normocephalic and atraumatic.       Has hearing aid  Cardiovascular:       Slow rate with intermittent irregular rhythm  Abdominal: Soft. There is no tenderness.       No umbilical hernia.  Genitourinary:       Bilateral groin scars and bulges-left greater than right- that are reducible in the supine position.  Musculoskeletal: He exhibits no edema.  Skin: Skin is warm and dry.  Psychiatric: He has a normal mood and affect. His behavior is normal.    Data Reviewed Old Chart  Assessment    Increasingly symptomatic recurrent left inguinal hernia.  The recurrent right inguinal hernia is not bothering him.    Plan    I suggested that he seriously consider repair of the recurrent left inguinal hernia with mesh using local anesthesia and sedation.  I have explained the procedure, risks, and aftercare of inguinal hernia repair.  Risks include but are not limited to bleeding, infection, wound  problems, anesthesia, recurrence, bladder or intestine injury, urinary retention, testicular dysfunction, chronic pain, mesh problems.  He seems to understand.  I will discuss this with his son and Dr. Graciela Husbands and then call him back.       Edilia Ghuman J 08/06/2011, 9:09 AM

## 2011-08-18 NOTE — Interval H&P Note (Signed)
History and Physical Interval Note:  08/18/2011 11:43 AM  Manuel Lewis  has presented today for surgery, with the diagnosis of recurrent Left inguinal hernia  The various methods of treatment have been discussed with the patient and family. After consideration of risks, benefits and other options for treatment, the patient has consented to  Procedure(s) (LRB): HERNIA REPAIR INGUINAL ADULT (Left) INSERTION OF MESH (Left) as a surgical intervention .  The patients' history has been reviewed, patient examined, no change in status, stable for surgery.  I have reviewed the patients' chart and labs.  Questions were answered to the patient's satisfaction.     Karleigh Bunte Shela Commons

## 2011-08-18 NOTE — Op Note (Signed)
Operative Note  Manuel Lewis male 78 y.o. 08/18/2011  PREOPERATIVE DX:  Recurrent left inguinal hernia  POSTOPERATIVE DX:  Same  PROCEDURE:  Repair of recurrent left inguinal hernia with mesh         Surgeon: Adolph Pollack   Assistants: None  Anesthesia: Monitored Local Anesthesia (Mixture of Xylocaine and Marcaine) with Sedation  Indications:   Mr. Pacer is an 78 year old male with recurrent bilateral inguinal hernias but only the left side is symptomatic. He has a number of comorbidities. He now presents for the above procedure.    Procedure Detail:  He was seen in the holding area in the left groin marked my initials. He is brought to the operating room placed supine on the operating table given intravenous sedation. The hair in the left groin was clipped. The left groin and lower down the wall were sterilely prepped and draped.  Local anesthetic was infiltrated superficially and deep in the left groin area. The previous left groin scar was identified and  incised partially.  The subcutaneous tissues were divided with electrocautery until the external oblique aponeurosis was exposed.  Local anesthetic was infiltrated deep to the external oblique aponeurosis. An incision was made in the external oblique aponeurosis exposing the underlying spermatic cord, hernia and internal oblique aponeurosis and muscle.  The spermatic cord was isolated and a window created around it. A large indirect hernia was noted. The indirect hernia contents and sac were dissected free from the spermatic cord and reduced. The internal ring defect was closed with a running 2-0 Vicryl suture.  A piece of 3" x 6" polypropylene mesh is brought onto the field. It was anchored to centimeters medial to the pubic tubercle with 2-0 Prolene suture. The inferior mesh was anchored to the shelving edge of the inguinal ligament with a running 2-0 Prolene suture.  This was done until it was 2-3 cm lateral to the internal  ring area.  A slit was cut into the mesh creating 2 tails which were wrapped around the spermatic cord. The superior aspect of the mesh was anchored to the internal oblique aponeurosis with interrupted 2-0 Vicryl suture. The 2 tails of the mesh were then crossed creating a new internal ring and anchored to the shelving edge of the inguinal ligament with a 2-0 Prolene suture. The tip of a hemostat could be placed through the new aperture.  That was inspected and hemostasis was adequate. The external oblique aponeurosis was closed over the mesh and cord with running 3-0 Vicryl suture. The subcutaneous tissues approximated with running 3-0 Vicryl suture. The skin was closed with running 4-0 Monocryl subcuticular stitch. Steri-Strips and sterile dressing were applied. The left testicle was in its normal position in the scrotum.  He tolerated the procedure well without any apparent complications and was taken to the recovery room in satisfactory condition.   Estimated Blood Loss:  less than 50 mL         Drains: none          Blood Given: none          Specimens: none        Complications:  * No complications entered in OR log *         Disposition: PACU - hemodynamically stable.         Condition: stable

## 2011-08-19 ENCOUNTER — Encounter (HOSPITAL_COMMUNITY): Payer: Self-pay | Admitting: General Surgery

## 2011-08-19 MED ORDER — HYDROCODONE-ACETAMINOPHEN 5-325 MG PO TABS: 1.0000 | ORAL_TABLET | ORAL | Status: AC | PRN

## 2011-08-19 MED ORDER — TAMSULOSIN HCL 0.4 MG PO CAPS: 0.4000 mg | ORAL_CAPSULE | Freq: Every day | ORAL | Status: DC

## 2011-08-19 NOTE — Progress Notes (Signed)
1 Day Post-Op  Subjective: Tired.  Did not sleep well. Voiding okay.  Had some nausea after breakfast.  Ginger ale helps with nausea.  Walked in hall.  Objective: Vital signs in last 24 hours: Temp:  [97 F (36.1 C)-98.9 F (37.2 C)] 98.9 F (37.2 C) (06/04 0420) Pulse Rate:  [58-61] 60  (06/04 0420) Resp:  [9-28] 18  (06/04 0420) BP: (123-172)/(68-95) 123/77 mmHg (06/04 0420) SpO2:  [95 %-100 %] 95 % (06/04 0420) Weight:  [160 lb 7.9 oz (72.8 kg)] 160 lb 7.9 oz (72.8 kg) (06/03 1544) Last BM Date: 08/18/11  Intake/Output from previous day: 06/03 0701 - 06/04 0700 In: 1620 [P.O.:720; I.V.:900] Out: 2900 [Urine:2900] Intake/Output this shift: Total I/O In: -  Out: 200 [Urine:200]  PE: CV-RRR GU-left groin dressing dry with small amount of swelling.  Lab Results:  No results found for this basename: WBC:2,HGB:2,HCT:2,PLT:2 in the last 72 hours BMET No results found for this basename: NA:2,K:2,CL:2,CO2:2,GLUCOSE:2,BUN:2,CREATININE:2,CALCIUM:2 in the last 72 hours PT/INR No results found for this basename: LABPROT:2,INR:2 in the last 72 hours Comprehensive Metabolic Panel:    Component Value Date/Time   NA 136 08/14/2011 1045   K 3.5 08/14/2011 1045   CL 101 08/14/2011 1045   CO2 25 08/14/2011 1045   BUN 19 08/14/2011 1045   CREATININE 1.11 08/14/2011 1045   GLUCOSE 91 08/14/2011 1045   CALCIUM 9.7 08/14/2011 1045   AST 28 08/14/2011 1045   ALT 17 08/14/2011 1045   ALKPHOS 55 08/14/2011 1045   BILITOT 0.8 08/14/2011 1045   PROT 7.0 08/14/2011 1045   ALBUMIN 4.1 08/14/2011 1045     Studies/Results: No results found.  Anti-infectives: Anti-infectives     Start     Dose/Rate Route Frequency Ordered Stop   08/17/11 1335   ceFAZolin (ANCEF) IVPB 1 g/50 mL premix        1 g 100 mL/hr over 30 Minutes Intravenous 60 min pre-op 08/17/11 1335 08/18/11 1147          Assessment Active Problems:  Post recurrent LIH repair-stable overnight; feels tired and a little nauseated  currently    LOS: 1 day   Plan: If he tolerates lunch and is feeling better will discharge him this afternoon.   Vincentina Sollers J 08/19/2011

## 2011-08-19 NOTE — Discharge Instructions (Addendum)
[facility] _______[facility]  UMBILICAL OR INGUINAL HERNIA REPAIR: POST OP INSTRUCTIONS  Always review your discharge instruction sheet given to you by the facility where your surgery was performed. IF YOU HAVE DISABILITY OR FAMILY LEAVE FORMS, YOU MUST BRING THEM TO THE OFFICE FOR PROCESSING.   DO NOT GIVE THEM TO YOUR DOCTOR.  1. A  prescription for pain medication may be given to you upon discharge.  Take your pain medication as prescribed, if needed.  If narcotic pain medicine is not needed, then you may take acetaminophen (Tylenol) or ibuprofen (Advil) as needed. 2. Take your usually prescribed medications unless otherwise directed. 3. If you need a refill on your pain medication, please contact your pharmacy.  They will contact our office to request authorization. Prescriptions will not be filled after 5 pm or on week-ends. 4. You should follow a light diet the first 24 hours after arrival home, such as soup and crackers, etc.  Be sure to include lots of fluids daily.  Resume your normal diet the day after surgery. 5. Most patients will experience some swelling and bruising around the umbilicus or in the groin and scrotum.  Ice packs and reclining will help.  Swelling and bruising can take several days to resolve.  6. It is common to experience some constipation if taking pain medication after surgery.  Increasing fluid intake and taking a stool softener (such as Colace) will usually help or prevent this problem from occurring.  A mild laxative (Milk of Magnesia or Miralax) should be taken according to package directions if there are no bowel movements after 48 hours. 7. Unless discharge instructions indicate otherwise, you may remove your bandages 72 hours after surgery, and you may shower at that time.  You may have steri-strips (small skin tapes) in place directly over the incision.  These strips should be left on the skin .  If your surgeon used skin glue on the incision, you may  shower in 24 hours.  The glue will flake off over the next 2-3 weeks.  Any sutures or staples will be removed at the office during your follow-up visit. 8. ACTIVITIES:  You may resume regular (light) daily activities beginning the next day--such as daily self-care, walking, climbing stairs--gradually increasing activities as tolerated.  You may have sexual intercourse when it is comfortable.  Refrain from any heavy lifting or straining-nothing over 10 pounds for 6 weeks.  a. You may drive when you are no longer taking prescription pain medication, you can comfortably wear a seatbelt, and you can safely maneuver your car and apply brakes. b. RETURN TO WORK:  __________________________________________________________ 9. You should see your doctor in the office for a follow-up appointment approximately 3 weeks after your surgery.  Make sure that you call for this appointment within a day or two after you arrive home to insure a convenient appointment time. 10. OTHER INSTRUCTIONS:  _________Resume your Coumadin tomorrow and have your INR checked in 4 days._________________________________________________________________________________________________________________________________________________________________________________  WHEN TO CALL YOUR DOCTOR: 1. Fever over 101.0 2. Inability to urinate 3. Nausea and/or vomiting 4. Extreme swelling or bruising 5. Continued bleeding from incision. 6. Increased pain, redness, or drainage from the incision  The clinic staff is available to answer your questions during regular business hours.  Please don't hesitate to call and ask to speak to one of the nurses for clinical concerns.  If you have a medical emergency, go to the nearest emergency room or call 911.  A surgeon from [employer]  [facility] is always on call at the hospital   [address], [address], [place], [place]  [address] ?  [address] [address], [place], [place]   [address] [phone] ?  [phone] ? FAX [phone] Web site: [email or web address]

## 2011-08-19 NOTE — Progress Notes (Signed)
He took a nap and slept 3 hours and feels significantly better.  He ate lunch okay.  He wants to go home.  Will discharge.  Instructions given.

## 2011-08-21 ENCOUNTER — Telehealth (INDEPENDENT_AMBULATORY_CARE_PROVIDER_SITE_OTHER): Payer: Self-pay | Admitting: General Surgery

## 2011-08-21 NOTE — Telephone Encounter (Signed)
Wife calling in to report on measured urinary output since discharge.  He has been voiding regularly, from 100-350 cc at a time.  He has documented prostate enlargement, so goes frequently, as he is now.  His po intake is very good, too.  Reassured wife he is voiding enough and she can stop measuring it now.  She understands and is grateful.

## 2011-08-22 NOTE — Discharge Summary (Signed)
Physician Discharge Summary  Patient ID: Manuel Lewis MRN: [id number] DOB/AGE: 1921/04/20 78 y.o.  Admit date: 08/18/2011 Discharge date: 08/19/2011  Admission Diagnoses:  Recurrent left inguinal hernia  Discharge Diagnoses: Recurrent left inguinal hernia Active Problems:  * No active hospital problems. *    Discharged Condition: good  Hospital Course: He was admitted to telemetry postoperatively and started on Flomax.  He did well and did not have an untoward events occur.  He was voiding adequately, ambulatory and tolerating his diet.  He was able to be discharged.  Instructions were given.  Consults: None  Significant Diagnostic Studies: none  Treatments: surgery: Repair of recurrent left inguinal hernia with mesh 08/17/2101.  Discharge Exam: Blood pressure 113/68, pulse 60, temperature 98.5 F (36.9 C), temperature source Oral, resp. rate 18, height 5\' 10"  (1.778 m), weight 160 lb 7.9 oz (72.8 kg), SpO2 96.00%.   Disposition: 01-Home or Self Care  Discharge Orders    Future Appointments: Provider: Department: Dept Phone: Center:   09/10/2011 8:45 AM Adolph Pollack, MD [facility] [phone] None     Medication List  As of 08/22/2011  8:40 AM   TAKE these medications         alendronate 70 MG tablet   Commonly known as: FOSAMAX   Take 70 mg by mouth every 7 (seven) days. Take with a full glass of water on an empty stomach. Take on Monday      amLODipine 10 MG tablet   Commonly known as: NORVASC   Take 10 mg by mouth daily.      Calcium-Vitamin D-Minerals 600-400 MG-UNIT Chew   Chew 1 tablet by mouth daily.      docusate sodium 100 MG capsule   Commonly known as: COLACE   Take 100 mg by mouth daily.      hydrochlorothiazide 12.5 MG capsule   Commonly known as: MICROZIDE   Take 12.5 mg by mouth daily.      HYDROcodone-acetaminophen 5-325 MG per tablet   Commonly known as: NORCO   Take 1 tablet by mouth every 4 (four) hours as needed.      losartan 100  MG tablet   Commonly known as: COZAAR   Take 100 mg by mouth daily.      multivitamin tablet   Take 1 tablet by mouth daily.      Ocuvite PreserVision Tabs   Take 2 tablets by mouth daily.      Tamsulosin HCl 0.4 MG Caps   Commonly known as: FLOMAX   Take 1 capsule (0.4 mg total) by mouth daily.      Vitamin D 400 UNITS capsule   Take 400 Units by mouth daily.      warfarin 2 MG tablet   Commonly known as: COUMADIN   Take 2 mg by mouth daily. Last dose 5/28             Signed: Kimbly Eanes J 08/22/2011, 8:40 AM

## 2011-08-25 DIAGNOSIS — Z7901 Long term (current) use of anticoagulants: Secondary | ICD-10-CM | POA: Diagnosis not present

## 2011-08-26 ENCOUNTER — Ambulatory Visit (INDEPENDENT_AMBULATORY_CARE_PROVIDER_SITE_OTHER): Payer: Medicare Other | Admitting: Surgery

## 2011-08-26 ENCOUNTER — Encounter (INDEPENDENT_AMBULATORY_CARE_PROVIDER_SITE_OTHER): Payer: Self-pay | Admitting: Surgery

## 2011-08-26 VITALS — BP 124/78 | HR 70 | Temp 97.6°F | Resp 12 | Ht 70.0 in | Wt 167.0 lb

## 2011-08-26 DIAGNOSIS — IMO0002 Reserved for concepts with insufficient information to code with codable children: Secondary | ICD-10-CM

## 2011-08-26 MED ORDER — CEPHALEXIN 500 MG PO CAPS: 500.0000 mg | ORAL_CAPSULE | Freq: Four times a day (QID) | ORAL | Status: AC

## 2011-08-26 NOTE — Patient Instructions (Signed)
Start antibiotic today. You need to be seen again next week. If you have any increase in pain, increase in redness around the incision or fever let us know

## 2011-08-26 NOTE — Progress Notes (Signed)
NAME: Manuel Lewis                                            DOB: January 09, 1922 DATE: 08/26/2011                                                  MRN: [id number]  CC: Post op   HPI: This patient comes in for post op follow-up.Heunderwent Repair of a recurrent LIH by Dr Abbey Chatters  on 08/18/11. He comes to the urgent office worried about the incision and swelling in the scrotum He feels he is otherwise doing well and having no pain.   PE:  VITAL SIGNS: BP 124/78  Pulse 70  Temp(Src) 97.6 F (36.4 C) (Temporal)  Resp 12  Ht 5\' 10"  (1.778 m)  Wt 167 lb (75.751 kg)  BMI 23.96 kg/m2  General: The patient appears to be healthy, NAD Wound is swollen and there is edema of the scrotum and some eccymosis in the anterior thigh  DATA REVIEWED: Epic notes  IMPRESSION: S/P LIH repair with apparent hematoma   PLAN: Discussed with patient and aspirated - retrieved 35 cc serous fluid. There is still a fair amount of erythema medially and the fluid was more lateral.  Will start antibiotic and he will follow up next week

## 2011-08-28 DIAGNOSIS — IMO0002 Reserved for concepts with insufficient information to code with codable children: Secondary | ICD-10-CM | POA: Diagnosis not present

## 2011-08-28 DIAGNOSIS — Z7901 Long term (current) use of anticoagulants: Secondary | ICD-10-CM | POA: Diagnosis not present

## 2011-08-28 DIAGNOSIS — I10 Essential (primary) hypertension: Secondary | ICD-10-CM | POA: Diagnosis not present

## 2011-09-01 ENCOUNTER — Encounter (INDEPENDENT_AMBULATORY_CARE_PROVIDER_SITE_OTHER): Payer: Medicare Other | Admitting: General Surgery

## 2011-09-01 DIAGNOSIS — Z7901 Long term (current) use of anticoagulants: Secondary | ICD-10-CM | POA: Diagnosis not present

## 2011-09-03 ENCOUNTER — Encounter (INDEPENDENT_AMBULATORY_CARE_PROVIDER_SITE_OTHER): Payer: Self-pay | Admitting: General Surgery

## 2011-09-03 ENCOUNTER — Ambulatory Visit (INDEPENDENT_AMBULATORY_CARE_PROVIDER_SITE_OTHER): Payer: Medicare Other | Admitting: General Surgery

## 2011-09-03 VITALS — BP 144/82 | HR 60 | Temp 98.0°F | Resp 18 | Ht 70.0 in | Wt 161.5 lb

## 2011-09-03 DIAGNOSIS — Z9889 Other specified postprocedural states: Secondary | ICD-10-CM

## 2011-09-03 NOTE — Patient Instructions (Signed)
Continue light activities. You may drive.  You may ride in an airplane.

## 2011-09-03 NOTE — Progress Notes (Signed)
Operation:  Repair of recurrent left inguinal hernia with mesh  Date:  August 18, 2011  HPI:  E is here for another postoperative visit. He developed a postoperative hematoma and was seen by Dr. Jamey Ripa last week.  The swelling from this is significantly improved. He did develop some acute swelling around the head of his penis at 10:00 last night. He is having no pain.   Physical Exam: Gen.-he looks well.  GU-left inguinal incision is clean and intact. He does have swelling and firmness deep to the incision as well as lateral to and down to the cord. There is an area of bullous-type edema around the left head of the penis that is nontender.  Assessment:  Status post repair of recurrent left inguinal hernia- swelling from hematoma is improving.  Has someedema around the head of his penis but I suspect this will resolve.  Plan:  I've recommended he try wearing an athletic supporter.  May drive. May ride in an airplane. Return visit one month.

## 2011-09-08 DIAGNOSIS — C61 Malignant neoplasm of prostate: Secondary | ICD-10-CM | POA: Diagnosis not present

## 2011-09-10 ENCOUNTER — Encounter (INDEPENDENT_AMBULATORY_CARE_PROVIDER_SITE_OTHER): Payer: Medicare Other | Admitting: General Surgery

## 2011-09-15 DIAGNOSIS — Z7901 Long term (current) use of anticoagulants: Secondary | ICD-10-CM | POA: Diagnosis not present

## 2011-09-22 DIAGNOSIS — Z7901 Long term (current) use of anticoagulants: Secondary | ICD-10-CM | POA: Diagnosis not present

## 2011-10-06 ENCOUNTER — Encounter (INDEPENDENT_AMBULATORY_CARE_PROVIDER_SITE_OTHER): Payer: Self-pay | Admitting: General Surgery

## 2011-10-06 ENCOUNTER — Ambulatory Visit (INDEPENDENT_AMBULATORY_CARE_PROVIDER_SITE_OTHER): Payer: Medicare Other | Admitting: General Surgery

## 2011-10-06 VITALS — BP 134/70 | HR 64 | Temp 98.4°F | Resp 14 | Ht 69.0 in | Wt 158.2 lb

## 2011-10-06 DIAGNOSIS — Z9889 Other specified postprocedural states: Secondary | ICD-10-CM

## 2011-10-06 DIAGNOSIS — Z7901 Long term (current) use of anticoagulants: Secondary | ICD-10-CM | POA: Diagnosis not present

## 2011-10-06 NOTE — Patient Instructions (Signed)
Call your Urologist and schedule an appointment with him.

## 2011-10-06 NOTE — Progress Notes (Signed)
Subjective:     Patient ID: Manuel Lewis, male   DOB: 23-May-1921, 78 y.o.   MRN: [id number]  HPI  He is here for another postop visit 7 weeks after repair of a recurrent LIH.  He had a postop hematoma.  He says the swelling is better.  He has no pain.   Review of Systems  Nocturia X 3.     Objective:   Physical Exam Left groin-incision clean and intact, swelling decreased, repair solid.    Assessment:     Continues to improve.  Hematoma slowly resolving.  Has nocturia.    Plan:     Suspect the hematoma will take months to resolve and this was explained to him.  I recommended he see his Urologist regarding his nocturia.  RTC prn.

## 2011-10-28 DIAGNOSIS — Z7901 Long term (current) use of anticoagulants: Secondary | ICD-10-CM | POA: Diagnosis not present

## 2011-11-11 DIAGNOSIS — Z7901 Long term (current) use of anticoagulants: Secondary | ICD-10-CM | POA: Diagnosis not present

## 2011-12-09 DIAGNOSIS — Z7901 Long term (current) use of anticoagulants: Secondary | ICD-10-CM | POA: Diagnosis not present

## 2011-12-10 DIAGNOSIS — H353290 Exudative age-related macular degeneration, unspecified eye, stage unspecified: Secondary | ICD-10-CM | POA: Diagnosis not present

## 2011-12-10 DIAGNOSIS — H35059 Retinal neovascularization, unspecified, unspecified eye: Secondary | ICD-10-CM | POA: Diagnosis not present

## 2011-12-10 DIAGNOSIS — H353190 Nonexudative age-related macular degeneration, unspecified eye, stage unspecified: Secondary | ICD-10-CM | POA: Diagnosis not present

## 2011-12-10 DIAGNOSIS — H35359 Cystoid macular degeneration, unspecified eye: Secondary | ICD-10-CM | POA: Diagnosis not present

## 2012-01-06 DIAGNOSIS — Z23 Encounter for immunization: Secondary | ICD-10-CM | POA: Diagnosis not present

## 2012-01-06 DIAGNOSIS — Z7901 Long term (current) use of anticoagulants: Secondary | ICD-10-CM | POA: Diagnosis not present

## 2012-02-03 DIAGNOSIS — Z7901 Long term (current) use of anticoagulants: Secondary | ICD-10-CM | POA: Diagnosis not present

## 2012-02-10 ENCOUNTER — Telehealth: Payer: Self-pay | Admitting: Internal Medicine

## 2012-02-10 NOTE — Telephone Encounter (Signed)
02-10-12 talked with pt's wife who schedules pt's appts, pt had appt 11-28-11 and cxl saying he would call back to rs, tried to get her to rs , however she said she's busy with company foe the next two months, I asked her if she could carve out an hour to bring him in and she said she didn't see how, she said she will call back to set him up/mt

## 2012-02-17 DIAGNOSIS — Z7901 Long term (current) use of anticoagulants: Secondary | ICD-10-CM | POA: Diagnosis not present

## 2012-02-27 DIAGNOSIS — Z7901 Long term (current) use of anticoagulants: Secondary | ICD-10-CM | POA: Diagnosis not present

## 2012-02-27 DIAGNOSIS — I10 Essential (primary) hypertension: Secondary | ICD-10-CM | POA: Diagnosis not present

## 2012-02-27 DIAGNOSIS — M899 Disorder of bone, unspecified: Secondary | ICD-10-CM | POA: Diagnosis not present

## 2012-03-01 DIAGNOSIS — H35359 Cystoid macular degeneration, unspecified eye: Secondary | ICD-10-CM | POA: Diagnosis not present

## 2012-03-01 DIAGNOSIS — H35059 Retinal neovascularization, unspecified, unspecified eye: Secondary | ICD-10-CM | POA: Diagnosis not present

## 2012-03-01 DIAGNOSIS — H353190 Nonexudative age-related macular degeneration, unspecified eye, stage unspecified: Secondary | ICD-10-CM | POA: Diagnosis not present

## 2012-03-01 DIAGNOSIS — H353290 Exudative age-related macular degeneration, unspecified eye, stage unspecified: Secondary | ICD-10-CM | POA: Diagnosis not present

## 2012-03-05 DIAGNOSIS — Z7901 Long term (current) use of anticoagulants: Secondary | ICD-10-CM | POA: Diagnosis not present

## 2012-03-24 DIAGNOSIS — Z7901 Long term (current) use of anticoagulants: Secondary | ICD-10-CM | POA: Diagnosis not present

## 2012-04-21 DIAGNOSIS — Z7901 Long term (current) use of anticoagulants: Secondary | ICD-10-CM | POA: Diagnosis not present

## 2012-05-19 DIAGNOSIS — Z7901 Long term (current) use of anticoagulants: Secondary | ICD-10-CM | POA: Diagnosis not present

## 2012-06-15 ENCOUNTER — Encounter: Payer: Medicare Other | Admitting: Internal Medicine

## 2012-06-15 DIAGNOSIS — Z7901 Long term (current) use of anticoagulants: Secondary | ICD-10-CM | POA: Diagnosis not present

## 2012-06-30 ENCOUNTER — Encounter: Payer: Self-pay | Admitting: Internal Medicine

## 2012-06-30 ENCOUNTER — Ambulatory Visit (INDEPENDENT_AMBULATORY_CARE_PROVIDER_SITE_OTHER): Payer: Medicare Other | Admitting: Internal Medicine

## 2012-06-30 VITALS — BP 176/90 | HR 60 | Ht 70.0 in | Wt 162.0 lb

## 2012-06-30 DIAGNOSIS — Z950 Presence of cardiac pacemaker: Secondary | ICD-10-CM

## 2012-06-30 DIAGNOSIS — R609 Edema, unspecified: Secondary | ICD-10-CM

## 2012-06-30 DIAGNOSIS — I10 Essential (primary) hypertension: Secondary | ICD-10-CM

## 2012-06-30 DIAGNOSIS — I498 Other specified cardiac arrhythmias: Secondary | ICD-10-CM

## 2012-06-30 DIAGNOSIS — I4891 Unspecified atrial fibrillation: Secondary | ICD-10-CM

## 2012-06-30 DIAGNOSIS — R001 Bradycardia, unspecified: Secondary | ICD-10-CM

## 2012-06-30 LAB — PACEMAKER DEVICE OBSERVATION
BMOD-0003RV: 30
RV LEAD AMPLITUDE: 22.4 mv

## 2012-06-30 NOTE — Assessment & Plan Note (Signed)
Permanent; on anticoagulation; rate controlled

## 2012-06-30 NOTE — Patient Instructions (Addendum)
Your physician wants you to follow-up in: ONE YEAR WITH DR KLEIN You will receive a reminder letter in the mail two months in advance. If you don't receive a letter, please call our office to schedule the follow-up appointment.    

## 2012-06-30 NOTE — Assessment & Plan Note (Signed)
Blood pressures at home have been well-controlled.

## 2012-06-30 NOTE — Assessment & Plan Note (Signed)
The patient has edema which may be related to his amlodipine or his hemodynamics. Rather than up titrating his Diuril I will ask him to followup with Dr. Valentina Lucks who he is scheduled to see him next 6 weeks or so at that point we can consider alternative antihypertensive therapy

## 2012-06-30 NOTE — Progress Notes (Signed)
Patient Care Team: Lillia Mountain, MD as PCP - General (Internal Medicine)   HPI  Manuel Lewis is a 79 y.o. male Seen to followup for  pacemaker care for single-chamber device implanted in May 2011 for bradycardia in the setting of permanent atrial fibrillation. Thromboembolic risk factors are notable for age-30 and hypertension for a CHADS VASC score of 3   The patient denies chest pain, shortness of breath, nocturnal dyspnea, orthopnea  Specific question is unable to uncover exercise limitation  There have been no palpitations, lightheadedness or syncope.   He does have some peripheral edema.  Blood pressures at home typically in the 130-140 range    Past Medical History  Diagnosis Date  . Nocturia   . Macular degeneration   . Hypertension 1990  . Chronic a-fib     since early 1990's with profound bradycardia and pauses; s/p  implantation  of Medtronic Adapta [id number], serial # [id number]  . Prostate cancer 1998    with seed implantation  . Complication of anesthesia 1998    heart stopped, has not had general anesthesia since that time  . Pacemaker   . Inguinal hernia     Past Surgical History  Procedure Laterality Date  . Appendectomy  1941  . Thyroidectomy, partial  1960    benign nodule  . Cataract extraction      2000 and 2007/ h/o macular degeneration since 2009  . Cardioversion      IN THE 1990's  . Insert / replace / remove pacemaker  08/14/09    IMPLANTATION OF SINGLE CHAMBER PULSE  GENERATING SYSTEM;   MODEL ADSR01, SERIAL # [id number] H ADAPTA  . US echocardiography  07/26/2009    EF 55-60%  . Tonsillectomy    . Inguinal hernia repair  08/18/2011    Procedure: HERNIA REPAIR INGUINAL ADULT;  Surgeon: Adolph Pollack, MD;  Location: [facility] OR;  Service: General;  Laterality: Left;  Recurrent Left inguinal hernia repair  . Hernia repair      1945 and 1985  . Hernia repair  08/18/11    inguinal    Current Outpatient Prescriptions  Medication Sig Dispense  Refill  . amLODipine (NORVASC) 10 MG tablet Take 10 mg by mouth daily.       . Calcium Carbonate-Vit D-Min 600-800 MG-UNIT TABS Take 1 tablet by mouth daily.      . Cholecalciferol (VITAMIN D) 400 UNITS capsule Take 400 Units by mouth daily.        Marland Kitchen docusate sodium (COLACE) 100 MG capsule Take 100 mg by mouth daily.       . hydrochlorothiazide (,MICROZIDE/HYDRODIURIL,) 12.5 MG capsule Take 12.5 mg by mouth daily.        Marland Kitchen losartan (COZAAR) 100 MG tablet Take 100 mg by mouth daily.        . Multiple Vitamin (MULTIVITAMIN) tablet Take 1 tablet by mouth daily.        . Multiple Vitamins-Minerals (OCUVITE PRESERVISION) TABS Take 1 tablet by mouth daily.       Marland Kitchen warfarin (COUMADIN) 2 MG tablet Take 2 mg by mouth daily.        No current facility-administered medications for this visit.    No Known Allergies  Review of Systems negative except from HPI and PMH  Physical Exam BP 176/90  Pulse 60  Ht 5\' 10"  (1.778 m)  Wt 162 lb (73.483 kg)  BMI 23.24 kg/m2 Well developed and well nourished in no acute distress HENT normal E scleral  and icterus clear Neck Supple JVP flat; carotids brisk and full Clear to ausculation  Regular rate and rhythm, no murmurs gallops or rub Soft with active bowel sounds No clubbing cyanosis 1+ Edema Alert and oriented, grossly normal motor and sensory function Skin Warm and Dry  ECG demonstrates atrial fibrillation/flutter with complete heart block and ventricular pacing at 60  Assessment and  Plan

## 2012-06-30 NOTE — Assessment & Plan Note (Signed)
.  dfbn The patient's device was interrogated.  The information was reviewed. No changes were made in the programming.

## 2012-07-01 ENCOUNTER — Encounter: Payer: Medicare Other | Admitting: Internal Medicine

## 2012-07-06 ENCOUNTER — Encounter: Payer: Self-pay | Admitting: Cardiology

## 2012-07-09 ENCOUNTER — Encounter: Payer: Self-pay | Admitting: Internal Medicine

## 2012-07-13 DIAGNOSIS — Z7901 Long term (current) use of anticoagulants: Secondary | ICD-10-CM | POA: Diagnosis not present

## 2012-07-29 DIAGNOSIS — H35379 Puckering of macula, unspecified eye: Secondary | ICD-10-CM | POA: Diagnosis not present

## 2012-07-29 DIAGNOSIS — H353190 Nonexudative age-related macular degeneration, unspecified eye, stage unspecified: Secondary | ICD-10-CM | POA: Diagnosis not present

## 2012-07-29 DIAGNOSIS — H35059 Retinal neovascularization, unspecified, unspecified eye: Secondary | ICD-10-CM | POA: Diagnosis not present

## 2012-07-29 DIAGNOSIS — H43829 Vitreomacular adhesion, unspecified eye: Secondary | ICD-10-CM | POA: Diagnosis not present

## 2012-08-10 DIAGNOSIS — Z7901 Long term (current) use of anticoagulants: Secondary | ICD-10-CM | POA: Diagnosis not present

## 2012-09-07 DIAGNOSIS — Z7901 Long term (current) use of anticoagulants: Secondary | ICD-10-CM | POA: Diagnosis not present

## 2012-09-13 DIAGNOSIS — N529 Male erectile dysfunction, unspecified: Secondary | ICD-10-CM | POA: Diagnosis not present

## 2012-09-13 DIAGNOSIS — C61 Malignant neoplasm of prostate: Secondary | ICD-10-CM | POA: Diagnosis not present

## 2012-09-13 DIAGNOSIS — N320 Bladder-neck obstruction: Secondary | ICD-10-CM | POA: Diagnosis not present

## 2012-09-13 DIAGNOSIS — Z8546 Personal history of malignant neoplasm of prostate: Secondary | ICD-10-CM | POA: Diagnosis not present

## 2012-09-16 DIAGNOSIS — Z0000 Encounter for general adult medical examination without abnormal findings: Secondary | ICD-10-CM | POA: Diagnosis not present

## 2012-09-16 DIAGNOSIS — I10 Essential (primary) hypertension: Secondary | ICD-10-CM | POA: Diagnosis not present

## 2012-09-16 DIAGNOSIS — Z1331 Encounter for screening for depression: Secondary | ICD-10-CM | POA: Diagnosis not present

## 2012-09-29 DIAGNOSIS — C4359 Malignant melanoma of other part of trunk: Secondary | ICD-10-CM | POA: Diagnosis not present

## 2012-09-29 DIAGNOSIS — D235 Other benign neoplasm of skin of trunk: Secondary | ICD-10-CM | POA: Diagnosis not present

## 2012-09-29 DIAGNOSIS — L819 Disorder of pigmentation, unspecified: Secondary | ICD-10-CM | POA: Diagnosis not present

## 2012-09-29 DIAGNOSIS — L570 Actinic keratosis: Secondary | ICD-10-CM | POA: Diagnosis not present

## 2012-09-29 DIAGNOSIS — L821 Other seborrheic keratosis: Secondary | ICD-10-CM | POA: Diagnosis not present

## 2012-10-05 DIAGNOSIS — Z7901 Long term (current) use of anticoagulants: Secondary | ICD-10-CM | POA: Diagnosis not present

## 2012-10-26 DIAGNOSIS — C4359 Malignant melanoma of other part of trunk: Secondary | ICD-10-CM | POA: Diagnosis not present

## 2012-11-02 DIAGNOSIS — Z7901 Long term (current) use of anticoagulants: Secondary | ICD-10-CM | POA: Diagnosis not present

## 2012-11-16 DIAGNOSIS — Z7901 Long term (current) use of anticoagulants: Secondary | ICD-10-CM | POA: Diagnosis not present

## 2012-11-30 DIAGNOSIS — Z7901 Long term (current) use of anticoagulants: Secondary | ICD-10-CM | POA: Diagnosis not present

## 2012-12-27 ENCOUNTER — Ambulatory Visit (INDEPENDENT_AMBULATORY_CARE_PROVIDER_SITE_OTHER): Payer: Medicare Other | Admitting: *Deleted

## 2012-12-27 DIAGNOSIS — I498 Other specified cardiac arrhythmias: Secondary | ICD-10-CM | POA: Diagnosis not present

## 2012-12-27 DIAGNOSIS — I4891 Unspecified atrial fibrillation: Secondary | ICD-10-CM

## 2012-12-27 DIAGNOSIS — R001 Bradycardia, unspecified: Secondary | ICD-10-CM

## 2012-12-27 LAB — PACEMAKER DEVICE OBSERVATION
BMOD-0005RV: 95 {beats}/min
BRDY-0002RV: 60 {beats}/min
RV LEAD AMPLITUDE: 11.2 mv
RV LEAD IMPEDENCE PM: 581 Ohm
RV LEAD THRESHOLD: 0.75 V

## 2012-12-27 NOTE — Progress Notes (Signed)
Pacemaker check in clinic. Normal device function. Thresholds, sensing, impedances consistent with previous measurements. Device programmed to maximize longevity. No high ventricular rates noted. Device programmed at appropriate safety margins. Histogram distribution appropriate for patient activity level. Device programmed to optimize intrinsic conduction. Estimated longevity 6 years. Patient enrolled in remote follow-up/TTM's with [employer]. Plan to follow every 3 months remotely and see annually in office. Patient education completed.  [facility] 04/01/12.

## 2012-12-28 DIAGNOSIS — Z7901 Long term (current) use of anticoagulants: Secondary | ICD-10-CM | POA: Diagnosis not present

## 2013-01-10 ENCOUNTER — Encounter: Payer: Self-pay | Admitting: Internal Medicine

## 2013-01-25 DIAGNOSIS — I4891 Unspecified atrial fibrillation: Secondary | ICD-10-CM | POA: Diagnosis not present

## 2013-01-25 DIAGNOSIS — Z7901 Long term (current) use of anticoagulants: Secondary | ICD-10-CM | POA: Diagnosis not present

## 2013-01-27 DIAGNOSIS — H353190 Nonexudative age-related macular degeneration, unspecified eye, stage unspecified: Secondary | ICD-10-CM | POA: Diagnosis not present

## 2013-01-27 DIAGNOSIS — H35359 Cystoid macular degeneration, unspecified eye: Secondary | ICD-10-CM | POA: Diagnosis not present

## 2013-01-27 DIAGNOSIS — H35379 Puckering of macula, unspecified eye: Secondary | ICD-10-CM | POA: Diagnosis not present

## 2013-01-27 DIAGNOSIS — H35059 Retinal neovascularization, unspecified, unspecified eye: Secondary | ICD-10-CM | POA: Diagnosis not present

## 2013-01-27 DIAGNOSIS — H353290 Exudative age-related macular degeneration, unspecified eye, stage unspecified: Secondary | ICD-10-CM | POA: Diagnosis not present

## 2013-02-01 DIAGNOSIS — Z85820 Personal history of malignant melanoma of skin: Secondary | ICD-10-CM | POA: Diagnosis not present

## 2013-02-01 DIAGNOSIS — D1801 Hemangioma of skin and subcutaneous tissue: Secondary | ICD-10-CM | POA: Diagnosis not present

## 2013-02-01 DIAGNOSIS — L819 Disorder of pigmentation, unspecified: Secondary | ICD-10-CM | POA: Diagnosis not present

## 2013-02-22 DIAGNOSIS — Z7901 Long term (current) use of anticoagulants: Secondary | ICD-10-CM | POA: Diagnosis not present

## 2013-02-22 DIAGNOSIS — I4891 Unspecified atrial fibrillation: Secondary | ICD-10-CM | POA: Diagnosis not present

## 2013-02-28 DIAGNOSIS — I10 Essential (primary) hypertension: Secondary | ICD-10-CM | POA: Diagnosis not present

## 2013-03-22 DIAGNOSIS — Z7901 Long term (current) use of anticoagulants: Secondary | ICD-10-CM | POA: Diagnosis not present

## 2013-03-22 DIAGNOSIS — I4891 Unspecified atrial fibrillation: Secondary | ICD-10-CM | POA: Diagnosis not present

## 2013-04-01 ENCOUNTER — Ambulatory Visit (INDEPENDENT_AMBULATORY_CARE_PROVIDER_SITE_OTHER): Payer: Medicare Other | Admitting: *Deleted

## 2013-04-01 DIAGNOSIS — I4891 Unspecified atrial fibrillation: Secondary | ICD-10-CM

## 2013-04-01 DIAGNOSIS — I498 Other specified cardiac arrhythmias: Secondary | ICD-10-CM | POA: Diagnosis not present

## 2013-04-01 DIAGNOSIS — R001 Bradycardia, unspecified: Secondary | ICD-10-CM

## 2013-04-06 LAB — MDC_IDC_ENUM_SESS_TYPE_REMOTE
Battery Remaining Longevity: 64 mo
Battery Voltage: 2.78 V
Lead Channel Impedance Value: 497 Ohm
Lead Channel Pacing Threshold Amplitude: 0.75 V
Lead Channel Pacing Threshold Pulse Width: 0.4 ms
MDC IDC MSMT BATTERY IMPEDANCE: 757 Ohm
MDC IDC MSMT LEADCHNL RA IMPEDANCE VALUE: 0 Ohm
MDC IDC SESS DTM: 20150116162159
MDC IDC SET LEADCHNL RV PACING AMPLITUDE: 2.5 V
MDC IDC SET LEADCHNL RV PACING PULSEWIDTH: 0.4 ms
MDC IDC SET LEADCHNL RV SENSING SENSITIVITY: 2.8 mV
MDC IDC STAT BRADY RV PERCENT PACED: 100 %

## 2013-04-15 DIAGNOSIS — Z7901 Long term (current) use of anticoagulants: Secondary | ICD-10-CM | POA: Diagnosis not present

## 2013-04-20 ENCOUNTER — Encounter: Payer: Self-pay | Admitting: *Deleted

## 2013-04-25 ENCOUNTER — Encounter: Payer: Self-pay | Admitting: Internal Medicine

## 2013-05-11 DIAGNOSIS — Z8679 Personal history of other diseases of the circulatory system: Secondary | ICD-10-CM | POA: Diagnosis not present

## 2013-05-11 DIAGNOSIS — Z7901 Long term (current) use of anticoagulants: Secondary | ICD-10-CM | POA: Diagnosis not present

## 2013-05-18 DIAGNOSIS — Z7901 Long term (current) use of anticoagulants: Secondary | ICD-10-CM | POA: Diagnosis not present

## 2013-05-18 DIAGNOSIS — I4891 Unspecified atrial fibrillation: Secondary | ICD-10-CM | POA: Diagnosis not present

## 2013-05-25 DIAGNOSIS — Z7901 Long term (current) use of anticoagulants: Secondary | ICD-10-CM | POA: Diagnosis not present

## 2013-05-25 DIAGNOSIS — I4891 Unspecified atrial fibrillation: Secondary | ICD-10-CM | POA: Diagnosis not present

## 2013-06-06 DIAGNOSIS — I4891 Unspecified atrial fibrillation: Secondary | ICD-10-CM | POA: Diagnosis not present

## 2013-06-06 DIAGNOSIS — R220 Localized swelling, mass and lump, head: Secondary | ICD-10-CM | POA: Diagnosis not present

## 2013-06-06 DIAGNOSIS — Z5181 Encounter for therapeutic drug level monitoring: Secondary | ICD-10-CM | POA: Diagnosis not present

## 2013-06-06 DIAGNOSIS — Z7901 Long term (current) use of anticoagulants: Secondary | ICD-10-CM | POA: Diagnosis not present

## 2013-06-15 DIAGNOSIS — I4891 Unspecified atrial fibrillation: Secondary | ICD-10-CM | POA: Diagnosis not present

## 2013-06-15 DIAGNOSIS — Z7901 Long term (current) use of anticoagulants: Secondary | ICD-10-CM | POA: Diagnosis not present

## 2013-06-27 DIAGNOSIS — S199XXA Unspecified injury of neck, initial encounter: Secondary | ICD-10-CM | POA: Diagnosis not present

## 2013-06-27 DIAGNOSIS — S0993XA Unspecified injury of face, initial encounter: Secondary | ICD-10-CM | POA: Diagnosis not present

## 2013-07-06 DIAGNOSIS — Z7901 Long term (current) use of anticoagulants: Secondary | ICD-10-CM | POA: Diagnosis not present

## 2013-07-06 DIAGNOSIS — I4891 Unspecified atrial fibrillation: Secondary | ICD-10-CM | POA: Diagnosis not present

## 2013-07-12 ENCOUNTER — Encounter: Payer: Self-pay | Admitting: Internal Medicine

## 2013-07-12 ENCOUNTER — Ambulatory Visit (INDEPENDENT_AMBULATORY_CARE_PROVIDER_SITE_OTHER): Payer: Medicare Other | Admitting: Internal Medicine

## 2013-07-12 VITALS — BP 169/91 | HR 69 | Ht 69.0 in | Wt 153.8 lb

## 2013-07-12 DIAGNOSIS — Z950 Presence of cardiac pacemaker: Secondary | ICD-10-CM | POA: Diagnosis not present

## 2013-07-12 DIAGNOSIS — I4891 Unspecified atrial fibrillation: Secondary | ICD-10-CM | POA: Diagnosis not present

## 2013-07-12 DIAGNOSIS — I498 Other specified cardiac arrhythmias: Secondary | ICD-10-CM

## 2013-07-12 DIAGNOSIS — R001 Bradycardia, unspecified: Secondary | ICD-10-CM

## 2013-07-12 LAB — MDC_IDC_ENUM_SESS_TYPE_INCLINIC
Battery Remaining Longevity: 61 mo
Battery Voltage: 2.78 V
Lead Channel Pacing Threshold Amplitude: 0.75 V
Lead Channel Setting Pacing Amplitude: 2.5 V
Lead Channel Setting Pacing Pulse Width: 0.4 ms
MDC IDC MSMT BATTERY IMPEDANCE: 836 Ohm
MDC IDC MSMT LEADCHNL RA IMPEDANCE VALUE: 0 Ohm
MDC IDC MSMT LEADCHNL RV IMPEDANCE VALUE: 500 Ohm
MDC IDC MSMT LEADCHNL RV PACING THRESHOLD PULSEWIDTH: 0.4 ms
MDC IDC MSMT LEADCHNL RV SENSING INTR AMPL: 8 mV
MDC IDC SESS DTM: 20150428121800
MDC IDC SET LEADCHNL RV SENSING SENSITIVITY: 2.8 mV
MDC IDC STAT BRADY RV PERCENT PACED: 100 %

## 2013-07-12 NOTE — Progress Notes (Signed)
Patient Care Team: [name], MD as PCP - General (Internal Medicine)   HPI  Manuel Lewis is a 80 y.o. male Seen to followup for  pacemaker care for single-chamber device implanted in May 2011 for bradycardia in the setting of permanent atrial fibrillation. Thromboembolic risk factors are notable for age-84 and hypertension for a CHADS VASC score of 3    The patient denies chest pain, shortness of breath, nocturnal dyspnea, orthopnea  Specific question is unable to uncover exercise limitation  There have been no palpitations, lightheadedness or syncope.   He does have some peripheral edema.  Blood pressures at home typically in the 130-140 range    Past Medical History  Diagnosis Date  . Nocturia   . Macular degeneration   . Hypertension 1990  . Chronic a-fib     since early 1990's with profound bradycardia and pauses; s/p  implantation  of Medtronic Adapta [id number], serial # [id number]  . Prostate cancer 1998    with seed implantation  . Complication of anesthesia 1998    heart stopped, has not had general anesthesia since that time  . Pacemaker   . Inguinal hernia     Past Surgical History  Procedure Laterality Date  . Appendectomy  1941  . Thyroidectomy, partial  1960    benign nodule  . Cataract extraction      2000 and 2007/ h/o macular degeneration since 2009  . Cardioversion      IN THE 1990's  . Insert / replace / remove pacemaker  08/14/09    IMPLANTATION OF SINGLE CHAMBER PULSE  GENERATING SYSTEM;   MODEL ADSR01, SERIAL # [id number] H ADAPTA  . US echocardiography  07/26/2009    EF 55-60%  . Tonsillectomy    . Inguinal hernia repair  08/18/2011    Procedure: HERNIA REPAIR INGUINAL ADULT;  Surgeon: [name], MD;  Location: [place];  Service: General;  Laterality: Left;  Recurrent Left inguinal hernia repair  . Hernia repair      1945 and 1985  . Hernia repair  08/18/11    inguinal    Current Outpatient Prescriptions  Medication Sig Dispense  Refill  . amLODipine (NORVASC) 10 MG tablet Take 10 mg by mouth daily.       . Calcium Carbonate-Vit D-Min 600-800 MG-UNIT TABS Take 1 tablet by mouth daily.      Marland Kitchen docusate sodium (COLACE) 100 MG capsule Take 100 mg by mouth daily.       . hydrochlorothiazide (,MICROZIDE/HYDRODIURIL,) 12.5 MG capsule Take 12.5 mg by mouth daily.        Marland Kitchen losartan (COZAAR) 100 MG tablet Take 100 mg by mouth daily.        . Multiple Vitamin (MULTIVITAMIN) tablet Take 1 tablet by mouth daily.        . Multiple Vitamins-Minerals (OCUVITE PRESERVISION) TABS Take 1 tablet by mouth daily.       Marland Kitchen warfarin (COUMADIN) 2 MG tablet Take 2 mg by mouth daily.        No current facility-administered medications for this visit.    No Known Allergies  Review of Systems negative except from HPI and PMH  Physical Exam BP 169/91  Pulse 69  Ht 5\' 9"  (1.753 m)  Wt 153 lb 12.8 oz (69.763 kg)  BMI 22.70 kg/m2 Well developed and well nourished in no acute distress HENT normal E scleral and icterus clear Neck Supple JVP flat; carotids brisk and full Clear to ausculation  Regular rate  and rhythm, no murmurs gallops or rub Soft with active bowel sounds No clubbing cyanosis no edema Alert and oriented, grossly normal motor and sensory function Skin Warm and Dry  ECG demonstrates atrial fibrillation/flutter with complete heart block and ventricular pacing at 60  Assessment and  Plan  Complete heart block stable post pacing  Atrial fibrillation-permanent   pacemaker-Medtronic The patient's device was interrogated.  The information was reviewed. No changes were made in the programming.     Hypertension His blood pressures at home are in the 120 range. Hence we will make no adjustments.  We discussed the use of the NOACs compared to Coumadin. We briefly reviewed the data of at least comparability in stroke prevention, bleeding and outcome. We discussed some of the new once wherein somewhat associated with decreased  ischemic stroke risk, one to be taken daily, and has been shown to be comparable and bleeding risk to aspirin.  We also discussed bleeding associated with warfarin as well as NOACs and a wall bleeding as a complication of all these drugs intracranial bleeding is more frequently associated with warfarin then the NOACs and a GI bleeding is more commonly associated with the latter She will let us know if you would like to switch.

## 2013-07-12 NOTE — Patient Instructions (Signed)

## 2013-07-22 ENCOUNTER — Encounter: Payer: Self-pay | Admitting: Internal Medicine

## 2013-08-02 DIAGNOSIS — Z85820 Personal history of malignant melanoma of skin: Secondary | ICD-10-CM | POA: Diagnosis not present

## 2013-08-02 DIAGNOSIS — L821 Other seborrheic keratosis: Secondary | ICD-10-CM | POA: Diagnosis not present

## 2013-08-02 DIAGNOSIS — L819 Disorder of pigmentation, unspecified: Secondary | ICD-10-CM | POA: Diagnosis not present

## 2013-08-02 DIAGNOSIS — D235 Other benign neoplasm of skin of trunk: Secondary | ICD-10-CM | POA: Diagnosis not present

## 2013-08-03 DIAGNOSIS — Z7901 Long term (current) use of anticoagulants: Secondary | ICD-10-CM | POA: Diagnosis not present

## 2013-08-03 DIAGNOSIS — I4891 Unspecified atrial fibrillation: Secondary | ICD-10-CM | POA: Diagnosis not present

## 2013-08-11 DIAGNOSIS — J069 Acute upper respiratory infection, unspecified: Secondary | ICD-10-CM | POA: Diagnosis not present

## 2013-08-31 DIAGNOSIS — Z7901 Long term (current) use of anticoagulants: Secondary | ICD-10-CM | POA: Diagnosis not present

## 2013-08-31 DIAGNOSIS — I10 Essential (primary) hypertension: Secondary | ICD-10-CM | POA: Diagnosis not present

## 2013-08-31 DIAGNOSIS — Z23 Encounter for immunization: Secondary | ICD-10-CM | POA: Diagnosis not present

## 2013-09-28 DIAGNOSIS — Z7901 Long term (current) use of anticoagulants: Secondary | ICD-10-CM | POA: Diagnosis not present

## 2013-09-28 DIAGNOSIS — Z5181 Encounter for therapeutic drug level monitoring: Secondary | ICD-10-CM | POA: Diagnosis not present

## 2013-10-03 DIAGNOSIS — N529 Male erectile dysfunction, unspecified: Secondary | ICD-10-CM | POA: Diagnosis not present

## 2013-10-03 DIAGNOSIS — Z8546 Personal history of malignant neoplasm of prostate: Secondary | ICD-10-CM | POA: Diagnosis not present

## 2013-10-03 DIAGNOSIS — C61 Malignant neoplasm of prostate: Secondary | ICD-10-CM | POA: Diagnosis not present

## 2013-10-03 DIAGNOSIS — N320 Bladder-neck obstruction: Secondary | ICD-10-CM | POA: Diagnosis not present

## 2013-10-13 ENCOUNTER — Encounter: Payer: Medicare Other | Admitting: *Deleted

## 2013-10-19 ENCOUNTER — Encounter: Payer: Self-pay | Admitting: *Deleted

## 2013-11-01 DIAGNOSIS — Z7901 Long term (current) use of anticoagulants: Secondary | ICD-10-CM | POA: Diagnosis not present

## 2013-11-04 DIAGNOSIS — I4891 Unspecified atrial fibrillation: Secondary | ICD-10-CM | POA: Diagnosis not present

## 2013-11-04 DIAGNOSIS — Z7901 Long term (current) use of anticoagulants: Secondary | ICD-10-CM | POA: Diagnosis not present

## 2013-11-18 DIAGNOSIS — I4891 Unspecified atrial fibrillation: Secondary | ICD-10-CM | POA: Diagnosis not present

## 2013-11-18 DIAGNOSIS — Z7901 Long term (current) use of anticoagulants: Secondary | ICD-10-CM | POA: Diagnosis not present

## 2013-11-23 DIAGNOSIS — L905 Scar conditions and fibrosis of skin: Secondary | ICD-10-CM | POA: Diagnosis not present

## 2013-11-23 DIAGNOSIS — K4091 Unilateral inguinal hernia, without obstruction or gangrene, recurrent: Secondary | ICD-10-CM | POA: Diagnosis not present

## 2013-11-25 DIAGNOSIS — Z7901 Long term (current) use of anticoagulants: Secondary | ICD-10-CM | POA: Diagnosis not present

## 2013-11-25 DIAGNOSIS — I4891 Unspecified atrial fibrillation: Secondary | ICD-10-CM | POA: Diagnosis not present

## 2013-11-29 DIAGNOSIS — H35369 Drusen (degenerative) of macula, unspecified eye: Secondary | ICD-10-CM | POA: Diagnosis not present

## 2013-11-29 DIAGNOSIS — H35359 Cystoid macular degeneration, unspecified eye: Secondary | ICD-10-CM | POA: Diagnosis not present

## 2013-11-29 DIAGNOSIS — H353290 Exudative age-related macular degeneration, unspecified eye, stage unspecified: Secondary | ICD-10-CM | POA: Diagnosis not present

## 2013-11-29 DIAGNOSIS — H353190 Nonexudative age-related macular degeneration, unspecified eye, stage unspecified: Secondary | ICD-10-CM | POA: Diagnosis not present

## 2013-12-07 ENCOUNTER — Encounter (INDEPENDENT_AMBULATORY_CARE_PROVIDER_SITE_OTHER): Payer: Medicare Other | Admitting: General Surgery

## 2013-12-09 DIAGNOSIS — I4891 Unspecified atrial fibrillation: Secondary | ICD-10-CM | POA: Diagnosis not present

## 2013-12-09 DIAGNOSIS — Z7901 Long term (current) use of anticoagulants: Secondary | ICD-10-CM | POA: Diagnosis not present

## 2013-12-28 ENCOUNTER — Encounter: Payer: Self-pay | Admitting: Cardiology

## 2014-01-06 DIAGNOSIS — I4891 Unspecified atrial fibrillation: Secondary | ICD-10-CM | POA: Diagnosis not present

## 2014-01-06 DIAGNOSIS — Z7901 Long term (current) use of anticoagulants: Secondary | ICD-10-CM | POA: Diagnosis not present

## 2014-01-19 DIAGNOSIS — Z7901 Long term (current) use of anticoagulants: Secondary | ICD-10-CM | POA: Diagnosis not present

## 2014-01-19 DIAGNOSIS — I4891 Unspecified atrial fibrillation: Secondary | ICD-10-CM | POA: Diagnosis not present

## 2014-01-31 DIAGNOSIS — D225 Melanocytic nevi of trunk: Secondary | ICD-10-CM | POA: Diagnosis not present

## 2014-01-31 DIAGNOSIS — D692 Other nonthrombocytopenic purpura: Secondary | ICD-10-CM | POA: Diagnosis not present

## 2014-01-31 DIAGNOSIS — Z85820 Personal history of malignant melanoma of skin: Secondary | ICD-10-CM | POA: Diagnosis not present

## 2014-01-31 DIAGNOSIS — Z08 Encounter for follow-up examination after completed treatment for malignant neoplasm: Secondary | ICD-10-CM | POA: Diagnosis not present

## 2014-02-22 DIAGNOSIS — Z7901 Long term (current) use of anticoagulants: Secondary | ICD-10-CM | POA: Diagnosis not present

## 2014-02-22 DIAGNOSIS — K635 Polyp of colon: Secondary | ICD-10-CM | POA: Diagnosis not present

## 2014-02-22 DIAGNOSIS — I10 Essential (primary) hypertension: Secondary | ICD-10-CM | POA: Diagnosis not present

## 2014-02-22 DIAGNOSIS — R6889 Other general symptoms and signs: Secondary | ICD-10-CM | POA: Diagnosis not present

## 2014-02-22 DIAGNOSIS — E210 Primary hyperparathyroidism: Secondary | ICD-10-CM | POA: Diagnosis not present

## 2014-03-08 DIAGNOSIS — I4891 Unspecified atrial fibrillation: Secondary | ICD-10-CM | POA: Diagnosis not present

## 2014-03-08 DIAGNOSIS — Z7901 Long term (current) use of anticoagulants: Secondary | ICD-10-CM | POA: Diagnosis not present

## 2014-04-04 ENCOUNTER — Telehealth: Payer: Self-pay | Admitting: Cardiology

## 2014-04-04 NOTE — Telephone Encounter (Signed)
Pt wife called and stated that pt does not want to follow device remotely. He wants to come into office every 3 months.

## 2014-04-05 DIAGNOSIS — Z7901 Long term (current) use of anticoagulants: Secondary | ICD-10-CM | POA: Diagnosis not present

## 2014-04-05 DIAGNOSIS — I4891 Unspecified atrial fibrillation: Secondary | ICD-10-CM | POA: Diagnosis not present

## 2014-04-26 DIAGNOSIS — I4891 Unspecified atrial fibrillation: Secondary | ICD-10-CM | POA: Diagnosis not present

## 2014-04-26 DIAGNOSIS — Z7901 Long term (current) use of anticoagulants: Secondary | ICD-10-CM | POA: Diagnosis not present

## 2014-05-24 DIAGNOSIS — I4891 Unspecified atrial fibrillation: Secondary | ICD-10-CM | POA: Diagnosis not present

## 2014-05-24 DIAGNOSIS — Z7901 Long term (current) use of anticoagulants: Secondary | ICD-10-CM | POA: Diagnosis not present

## 2014-06-21 DIAGNOSIS — Z7901 Long term (current) use of anticoagulants: Secondary | ICD-10-CM | POA: Diagnosis not present

## 2014-06-21 DIAGNOSIS — I4891 Unspecified atrial fibrillation: Secondary | ICD-10-CM | POA: Diagnosis not present

## 2014-06-27 DIAGNOSIS — H35363 Drusen (degenerative) of macula, bilateral: Secondary | ICD-10-CM | POA: Diagnosis not present

## 2014-06-27 DIAGNOSIS — H3531 Nonexudative age-related macular degeneration: Secondary | ICD-10-CM | POA: Diagnosis not present

## 2014-07-05 DIAGNOSIS — Z7901 Long term (current) use of anticoagulants: Secondary | ICD-10-CM | POA: Diagnosis not present

## 2014-07-18 ENCOUNTER — Encounter: Payer: Medicare Other | Admitting: Internal Medicine

## 2014-07-19 ENCOUNTER — Encounter: Payer: Self-pay | Admitting: Internal Medicine

## 2014-07-19 ENCOUNTER — Ambulatory Visit (INDEPENDENT_AMBULATORY_CARE_PROVIDER_SITE_OTHER): Payer: Medicare Other | Admitting: Internal Medicine

## 2014-07-19 VITALS — BP 132/70 | HR 67 | Ht 70.5 in | Wt 152.8 lb

## 2014-07-19 DIAGNOSIS — Z7901 Long term (current) use of anticoagulants: Secondary | ICD-10-CM | POA: Diagnosis not present

## 2014-07-19 DIAGNOSIS — R001 Bradycardia, unspecified: Secondary | ICD-10-CM

## 2014-07-19 DIAGNOSIS — I4891 Unspecified atrial fibrillation: Secondary | ICD-10-CM | POA: Diagnosis not present

## 2014-07-19 DIAGNOSIS — I482 Chronic atrial fibrillation, unspecified: Secondary | ICD-10-CM

## 2014-07-19 LAB — CUP PACEART INCLINIC DEVICE CHECK
Battery Impedance: 1368 Ohm
Battery Remaining Longevity: 46 mo
Battery Voltage: 2.77 V
Lead Channel Impedance Value: 0 Ohm
Lead Channel Pacing Threshold Amplitude: 0.75 V
Lead Channel Pacing Threshold Pulse Width: 0.4 ms
Lead Channel Setting Sensing Sensitivity: 2 mV
MDC IDC MSMT LEADCHNL RV IMPEDANCE VALUE: 511 Ohm
MDC IDC MSMT LEADCHNL RV SENSING INTR AMPL: 11.2 mV
MDC IDC SESS DTM: 20160504150949
MDC IDC SET LEADCHNL RV PACING AMPLITUDE: 2.5 V
MDC IDC SET LEADCHNL RV PACING PULSEWIDTH: 0.4 ms
MDC IDC STAT BRADY RV PERCENT PACED: 100 %

## 2014-07-19 NOTE — Progress Notes (Signed)
Patient Care Team: [name], MD as PCP - General (Internal Medicine)   HPI  Manuel Lewis is a 81 y.o. male Seen to followup for  pacemaker care for single-chamber device implanted in May 2011 for bradycardia in the setting of permanent atrial fibrillation. Thromboembolic risk factors are notable for age-78 and hypertension for a CHADS VASC score of 3    The patient denies chest pain, shortness of breath, nocturnal dyspnea, orthopnea  Specific question is unable to uncover exercise limitation  There have been no palpitations, lightheadedness or syncope.   He does have some peripheral edema.  Blood pressures at home typically in the 130-140 range    Past Medical History  Diagnosis Date  . Nocturia   . Macular degeneration   . Hypertension 1990  . Chronic a-fib     since early 1990's with profound bradycardia and pauses; s/p  implantation  of Medtronic Adapta [id number], serial # [id number]  . Prostate cancer 1998    with seed implantation  . Complication of anesthesia 1998    heart stopped, has not had general anesthesia since that time  . Pacemaker   . Inguinal hernia     Past Surgical History  Procedure Laterality Date  . Appendectomy  1941  . Thyroidectomy, partial  1960    benign nodule  . Cataract extraction      2000 and 2007/ h/o macular degeneration since 2009  . Cardioversion      IN THE 1990's  . Insert / replace / remove pacemaker  08/14/09    IMPLANTATION OF SINGLE CHAMBER PULSE  GENERATING SYSTEM;   MODEL ADSR01, SERIAL # [id number] H ADAPTA  . US echocardiography  07/26/2009    EF 55-60%  . Tonsillectomy    . Inguinal hernia repair  08/18/2011    Procedure: HERNIA REPAIR INGUINAL ADULT;  Surgeon: [name], MD;  Location: [place];  Service: General;  Laterality: Left;  Recurrent Left inguinal hernia repair  . Hernia repair      1945 and 1985  . Hernia repair  08/18/11    inguinal    Current Outpatient Prescriptions  Medication Sig Dispense Refill  .  amLODipine (NORVASC) 10 MG tablet Take 10 mg by mouth daily.     . Calcium Carbonate-Vit D-Min 600-800 MG-UNIT TABS Take 1 tablet by mouth daily.    Marland Kitchen docusate sodium (COLACE) 100 MG capsule Take 100 mg by mouth daily.     . hydrochlorothiazide (,MICROZIDE/HYDRODIURIL,) 12.5 MG capsule Take 12.5 mg by mouth daily.      Marland Kitchen losartan (COZAAR) 100 MG tablet Take 100 mg by mouth daily.      . Multiple Vitamin (MULTIVITAMIN) tablet Take 1 tablet by mouth daily.      . Multiple Vitamins-Minerals (OCUVITE PRESERVISION) TABS Take 1 tablet by mouth daily.     Marland Kitchen warfarin (COUMADIN) 2 MG tablet Take 2 mg by mouth daily.      No current facility-administered medications for this visit.    No Known Allergies  Review of Systems negative except from HPI and PMH  Physical Exam BP 132/70 mmHg  Pulse 67  Ht 5' 10.5" (1.791 m)  Wt 152 lb 12.8 oz (69.31 kg)  BMI 21.61 kg/m2 Well developed and well nourished in no acute distress HENT normal E scleral and icterus clear Neck Supple JVP flat; carotids brisk and full Clear to ausculation  Regular rate and rhythm, no murmurs gallops or rub Soft with active bowel sounds No clubbing cyanosis no  edema Alert and oriented, grossly normal motor and sensory function Skin Warm and Dry  ECG demonstrates atrial fibrillation/flutter with complete heart block and ventricular pacing at 60  Assessment and  Plan  Complete heart block stable post pacing  Atrial fibrillation-permanent   pacemaker-Medtronic The patient's device was interrogated.  The information was reviewed. No changes were made in the programming.     Hypertension  Irritability  His blood pressures at home are in the 120 range. Hence we will make no adjustments. Atrial fibrillation is well rate controlled; he is on warfarin I am concerned about his irritability whether manifest some kind of this in addition. I've asked him to follow-up with Dr. Inda Merlin about this.

## 2014-07-19 NOTE — Patient Instructions (Addendum)
Medication Instructions:  Your physician recommends that you continue on your current medications as directed. Please refer to the Current Medication list given to you today.   Labwork:   Testing/Procedures:   Follow-Up: Your physician wants you to follow-up in:  [place] will receive a reminder letter in the mail two months in advance. If you don't receive a letter, please call our office to schedule the follow-up appointment.   Remote monitoring is used to monitor your Pacemaker of ICD from home. October 18 2014 This monitoring reduces the number of office visits required to check your device to one time per year. It allows [place] to keep an eye on the functioning of your device to ensure it is working properly. You are scheduled for a device check from home on . You may send your transmission at any time that day. If you have a wireless device, the transmission will be sent automatically. After your physician reviews your transmission, you will receive a postcard with your next transmission date.    Any Other Special Instructions Will Be Listed Below (If Applicable).

## 2014-07-25 ENCOUNTER — Encounter: Payer: Self-pay | Admitting: Internal Medicine

## 2014-08-01 DIAGNOSIS — L814 Other melanin hyperpigmentation: Secondary | ICD-10-CM | POA: Diagnosis not present

## 2014-08-01 DIAGNOSIS — Z08 Encounter for follow-up examination after completed treatment for malignant neoplasm: Secondary | ICD-10-CM | POA: Diagnosis not present

## 2014-08-01 DIAGNOSIS — Z85820 Personal history of malignant melanoma of skin: Secondary | ICD-10-CM | POA: Diagnosis not present

## 2014-08-01 DIAGNOSIS — L570 Actinic keratosis: Secondary | ICD-10-CM | POA: Diagnosis not present

## 2014-08-01 DIAGNOSIS — L821 Other seborrheic keratosis: Secondary | ICD-10-CM | POA: Diagnosis not present

## 2014-08-15 DIAGNOSIS — M7751 Other enthesopathy of right foot: Secondary | ICD-10-CM | POA: Diagnosis not present

## 2014-08-17 DIAGNOSIS — Z7901 Long term (current) use of anticoagulants: Secondary | ICD-10-CM | POA: Diagnosis not present

## 2014-08-17 DIAGNOSIS — I4891 Unspecified atrial fibrillation: Secondary | ICD-10-CM | POA: Diagnosis not present

## 2014-08-21 ENCOUNTER — Encounter: Payer: Self-pay | Admitting: Internal Medicine

## 2014-08-25 DIAGNOSIS — I10 Essential (primary) hypertension: Secondary | ICD-10-CM | POA: Diagnosis not present

## 2014-08-25 DIAGNOSIS — Z1389 Encounter for screening for other disorder: Secondary | ICD-10-CM | POA: Diagnosis not present

## 2014-08-25 DIAGNOSIS — I482 Chronic atrial fibrillation: Secondary | ICD-10-CM | POA: Diagnosis not present

## 2014-08-25 DIAGNOSIS — E210 Primary hyperparathyroidism: Secondary | ICD-10-CM | POA: Diagnosis not present

## 2014-08-25 DIAGNOSIS — Z0000 Encounter for general adult medical examination without abnormal findings: Secondary | ICD-10-CM | POA: Diagnosis not present

## 2014-09-14 DIAGNOSIS — Z7901 Long term (current) use of anticoagulants: Secondary | ICD-10-CM | POA: Diagnosis not present

## 2014-09-14 DIAGNOSIS — S86119A Strain of other muscle(s) and tendon(s) of posterior muscle group at lower leg level, unspecified leg, initial encounter: Secondary | ICD-10-CM | POA: Diagnosis not present

## 2014-09-19 DIAGNOSIS — Z7901 Long term (current) use of anticoagulants: Secondary | ICD-10-CM | POA: Diagnosis not present

## 2014-09-25 DIAGNOSIS — Z7901 Long term (current) use of anticoagulants: Secondary | ICD-10-CM | POA: Diagnosis not present

## 2014-10-02 DIAGNOSIS — C61 Malignant neoplasm of prostate: Secondary | ICD-10-CM | POA: Diagnosis not present

## 2014-10-02 DIAGNOSIS — Z8546 Personal history of malignant neoplasm of prostate: Secondary | ICD-10-CM | POA: Diagnosis not present

## 2014-10-05 DIAGNOSIS — Z7901 Long term (current) use of anticoagulants: Secondary | ICD-10-CM | POA: Diagnosis not present

## 2014-10-23 DIAGNOSIS — Z7901 Long term (current) use of anticoagulants: Secondary | ICD-10-CM | POA: Diagnosis not present

## 2014-11-22 DIAGNOSIS — Z7901 Long term (current) use of anticoagulants: Secondary | ICD-10-CM | POA: Diagnosis not present

## 2014-12-20 DIAGNOSIS — Z23 Encounter for immunization: Secondary | ICD-10-CM | POA: Diagnosis not present

## 2014-12-20 DIAGNOSIS — Z7901 Long term (current) use of anticoagulants: Secondary | ICD-10-CM | POA: Diagnosis not present

## 2015-01-17 DIAGNOSIS — Z7901 Long term (current) use of anticoagulants: Secondary | ICD-10-CM | POA: Diagnosis not present

## 2015-01-30 DIAGNOSIS — C44319 Basal cell carcinoma of skin of other parts of face: Secondary | ICD-10-CM | POA: Diagnosis not present

## 2015-01-30 DIAGNOSIS — D225 Melanocytic nevi of trunk: Secondary | ICD-10-CM | POA: Diagnosis not present

## 2015-01-30 DIAGNOSIS — L821 Other seborrheic keratosis: Secondary | ICD-10-CM | POA: Diagnosis not present

## 2015-01-30 DIAGNOSIS — Z85820 Personal history of malignant melanoma of skin: Secondary | ICD-10-CM | POA: Diagnosis not present

## 2015-01-30 DIAGNOSIS — D1801 Hemangioma of skin and subcutaneous tissue: Secondary | ICD-10-CM | POA: Diagnosis not present

## 2015-02-06 DIAGNOSIS — H353134 Nonexudative age-related macular degeneration, bilateral, advanced atrophic with subfoveal involvement: Secondary | ICD-10-CM | POA: Diagnosis not present

## 2015-02-06 DIAGNOSIS — H43812 Vitreous degeneration, left eye: Secondary | ICD-10-CM | POA: Diagnosis not present

## 2015-02-06 DIAGNOSIS — H35363 Drusen (degenerative) of macula, bilateral: Secondary | ICD-10-CM | POA: Diagnosis not present

## 2015-02-14 DIAGNOSIS — Z7901 Long term (current) use of anticoagulants: Secondary | ICD-10-CM | POA: Diagnosis not present

## 2015-02-28 DIAGNOSIS — I10 Essential (primary) hypertension: Secondary | ICD-10-CM | POA: Diagnosis not present

## 2015-03-14 DIAGNOSIS — Z7901 Long term (current) use of anticoagulants: Secondary | ICD-10-CM | POA: Diagnosis not present

## 2015-06-06 ENCOUNTER — Encounter: Payer: Self-pay | Admitting: Internal Medicine

## 2015-06-06 ENCOUNTER — Ambulatory Visit (INDEPENDENT_AMBULATORY_CARE_PROVIDER_SITE_OTHER): Payer: 59 | Admitting: *Deleted

## 2015-06-06 DIAGNOSIS — I482 Chronic atrial fibrillation, unspecified: Secondary | ICD-10-CM

## 2015-06-06 DIAGNOSIS — R001 Bradycardia, unspecified: Secondary | ICD-10-CM

## 2015-06-06 LAB — CUP PACEART INCLINIC DEVICE CHECK
Battery Impedance: 1880 Ohm
Battery Voltage: 2.75 V
Implantable Lead Implant Date: 20110531
Implantable Lead Location: 753860
Implantable Lead Model: 4471
Lead Channel Impedance Value: 0 Ohm
Lead Channel Pacing Threshold Amplitude: 0.75 V
Lead Channel Pacing Threshold Pulse Width: 0.4 ms
Lead Channel Pacing Threshold Pulse Width: 0.4 ms
Lead Channel Setting Pacing Amplitude: 2.5 V
Lead Channel Setting Sensing Sensitivity: 2.8 mV
MDC IDC LEAD SERIAL: 484940
MDC IDC MSMT BATTERY REMAINING LONGEVITY: 36 mo
MDC IDC MSMT LEADCHNL RV IMPEDANCE VALUE: 496 Ohm
MDC IDC MSMT LEADCHNL RV PACING THRESHOLD AMPLITUDE: 0.75 V
MDC IDC MSMT LEADCHNL RV SENSING INTR AMPL: 11.2 mV
MDC IDC SESS DTM: 20170322154023
MDC IDC SET LEADCHNL RV PACING PULSEWIDTH: 0.4 ms
MDC IDC STAT BRADY RV PERCENT PACED: 100 %

## 2015-06-06 NOTE — Progress Notes (Signed)
Pacemaker check in clinic. Normal device function. Threshold, sensing, and impedance consistent with previous measurements. Device programmed to maximize longevity. Permanent AF+ warfarin. No high ventricular rates noted. Device programmed at appropriate safety margins. Histogram distribution appropriate for patient activity level. Device programmed to optimize intrinsic conduction. Estimated longevity 3 years. Patient will follow up with [facility] in 6 months.

## 2015-08-30 DIAGNOSIS — H35363 Drusen (degenerative) of macula, bilateral: Secondary | ICD-10-CM | POA: Diagnosis not present

## 2015-08-30 DIAGNOSIS — H353114 Nonexudative age-related macular degeneration, right eye, advanced atrophic with subfoveal involvement: Secondary | ICD-10-CM | POA: Diagnosis not present

## 2015-08-30 DIAGNOSIS — H43812 Vitreous degeneration, left eye: Secondary | ICD-10-CM | POA: Diagnosis not present

## 2015-08-30 DIAGNOSIS — H353123 Nonexudative age-related macular degeneration, left eye, advanced atrophic without subfoveal involvement: Secondary | ICD-10-CM | POA: Diagnosis not present

## 2015-12-30 NOTE — Progress Notes (Signed)
Cardiology Office Note Date:  12/31/2015  Patient ID:  Manuel Lewis 1921-10-19, MRN [id number] PCP:  [name], MD  Cardiologist:  Dr. Caryl Comes   Chief Complaint: annual visit  History of Present Illness: Manuel Lewis is a 82 y.o. male with history of permanent AFib, PPM, macular degeneration, HTN comes to the office today to be seen for Dr. Caryl Comes.  He was last seen him in May of 2016, at that time no changes were made to his therapy.    He tells me he is doing well, denies any kind of CP, palpitations or SOB, no dizziness, near syncope or syncope.  He denies any bleeding or signs of bleeding  Device history: MDT single chamber PPM, implanted 08/14/09, Dr. Doreatha Lew  Past Medical History:  Diagnosis Date  . Chronic a-fib    since early 1990's with profound bradycardia and pauses; s/p  implantation  of Medtronic Adapta [id number], serial # [id number]  . Complication of anesthesia 1998   heart stopped, has not had general anesthesia since that time  . Hypertension 1990  . Inguinal hernia   . Macular degeneration   . Nocturia   . Pacemaker   . Prostate cancer 1998   with seed implantation    Past Surgical History:  Procedure Laterality Date  . APPENDECTOMY  1941  . CARDIOVERSION     IN THE 1990's  . CATARACT EXTRACTION     2000 and 2007/ h/o macular degeneration since 2009  . HERNIA REPAIR     1945 and 1985  . HERNIA REPAIR  08/18/11   inguinal  . INGUINAL HERNIA REPAIR  08/18/2011   Procedure: HERNIA REPAIR INGUINAL ADULT;  Surgeon: [name], MD;  Location: [place];  Service: General;  Laterality: Left;  Recurrent Left inguinal hernia repair  . INSERT / REPLACE / REMOVE PACEMAKER  08/14/09   IMPLANTATION OF SINGLE CHAMBER PULSE  GENERATING SYSTEM;   MODEL ADSR01, SERIAL # [id number] H ADAPTA  . THYROIDECTOMY, PARTIAL  1960   benign nodule  . TONSILLECTOMY    . US ECHOCARDIOGRAPHY  07/26/2009   EF 55-60%    Current Outpatient Prescriptions  Medication Sig  Dispense Refill  . amLODipine (NORVASC) 10 MG tablet Take 10 mg by mouth daily.     . Calcium Carbonate-Vit D-Min 600-800 MG-UNIT TABS Take 1 tablet by mouth daily.    Marland Kitchen docusate sodium (COLACE) 100 MG capsule Take 100 mg by mouth daily.     . hydrochlorothiazide (,MICROZIDE/HYDRODIURIL,) 12.5 MG capsule Take 12.5 mg by mouth daily.      Marland Kitchen losartan (COZAAR) 100 MG tablet Take 100 mg by mouth daily.      . Multiple Vitamin (MULTIVITAMIN) tablet Take 1 tablet by mouth daily.      . Multiple Vitamins-Minerals (OCUVITE PRESERVISION) TABS Take 1 tablet by mouth daily.     Marland Kitchen warfarin (COUMADIN) 2 MG tablet Take 2 mg by mouth daily.      No current facility-administered medications for this visit.     Allergies:   Review of patient's allergies indicates no known allergies.   Social History:  The patient  reports that he has never smoked. He does not have any smokeless tobacco history on file. He reports that he drinks about 4.2 oz of alcohol per week . He reports that he does not use drugs.   Family History:  The patient's family history includes Kidney failure in his father; Stroke in his mother.  ROS:  Please see  the history of present illness.  All other systems are reviewed and otherwise negative.   PHYSICAL EXAM:  VS:  Ht 5\' 9"  (1.753 m)   Wt 150 lb (68 kg)   BMI 22.15 kg/m  BMI: Body mass index is 22.15 kg/m. Well nourished, well developed, in no acute distress  HEENT: normocephalic, atraumatic  Neck: no JVD, carotid bruits or masses Cardiac:  RRR, (paced); no significant murmurs, no rubs, or gallops Lungs:  clear to auscultation bilaterally, no wheezing, rhonchi or rales  Abd: soft, nontender MS: no deformity age appropriate atrophy Ext: no edema  Skin: warm and dry, no rash Neuro:  No gross deficits appreciated Psych: euthymic mood, full affect  PPM site is stable, no tethering or discomfort, (right side)   EKG:  Done today and reviewed by myself shows AF, V paced PPM  interrogation today: stable battery/lead status, no changes made  08/04/09: TTE Normal LV size and function Marked RA/LA enlargement modTR,mild p.HTN Mild MR/AI   Recent Labs: No results found for requested labs within last 8760 hours.  No results found for requested labs within last 8760 hours.   CrCl cannot be calculated (Patient's most recent lab result is older than the maximum 21 days allowed.).   Wt Readings from Last 3 Encounters:  12/31/15 150 lb (68 kg)  07/19/14 152 lb 12.8 oz (69.3 kg)  07/12/13 153 lb 12.8 oz (69.8 kg)     Other studies reviewed: Additional studies/records reviewed today include: summarized above  ASSESSMENT AND PLAN:  1. Permanent AFib     CHA2DS2Vasc is at least 3, on warfarin, monitored and managed by his PMD  2. Bradycardia, PPM     normal device function  3. HTN     stable   Disposition: F/u with 6 month in-clinic Pacer check (patient prefers in-clinic checks), and Dr. Caryl Comes in 1 year, sooner if needed  Current medicines are reviewed at length with the patient today.  The patient did not have any concerns regarding medicines.  [name], PA-C 12/31/2015 3:21 PM     [place] [address] [phone] (office)  [phone] (fax)

## 2015-12-31 ENCOUNTER — Ambulatory Visit (INDEPENDENT_AMBULATORY_CARE_PROVIDER_SITE_OTHER): Payer: Medicare Other | Admitting: Physician Assistant

## 2015-12-31 ENCOUNTER — Encounter: Payer: Self-pay | Admitting: Physician Assistant

## 2015-12-31 VITALS — Ht 69.0 in | Wt 150.0 lb

## 2015-12-31 DIAGNOSIS — R001 Bradycardia, unspecified: Secondary | ICD-10-CM

## 2015-12-31 DIAGNOSIS — Z950 Presence of cardiac pacemaker: Secondary | ICD-10-CM

## 2015-12-31 DIAGNOSIS — I4821 Permanent atrial fibrillation: Secondary | ICD-10-CM

## 2015-12-31 DIAGNOSIS — I10 Essential (primary) hypertension: Secondary | ICD-10-CM | POA: Diagnosis not present

## 2015-12-31 DIAGNOSIS — I482 Chronic atrial fibrillation: Secondary | ICD-10-CM

## 2015-12-31 NOTE — Patient Instructions (Signed)
Medication Instructions:   Your physician recommends that you continue on your current medications as directed. Please refer to the Current Medication list given to you today.   If you need a refill on your cardiac medications before your next appointment, please call your pharmacy.  Labwork: NONE ORDER TODAY    Testing/Procedures: NONE ORDER TODAY    Follow-Up: Your physician wants you to follow-up in: [place] will receive a reminder letter in the mail two months in advance. If you don't receive a letter, please call our office to schedule the follow-up appointment.  Your physician wants you to follow-up in:  [place]  [place] will receive a reminder letter in the mail two months in advance. If you don't receive a letter, please call our office to schedule the follow-up appointment.        Any Other Special Instructions Will Be Listed Below (If Applicable).

## 2016-05-16 ENCOUNTER — Encounter (HOSPITAL_COMMUNITY): Payer: Self-pay | Admitting: Emergency Medicine

## 2016-05-16 ENCOUNTER — Inpatient Hospital Stay (HOSPITAL_COMMUNITY)
Admission: EM | Admit: 2016-05-16 | Discharge: 2016-05-22 | DRG: 419 | Disposition: A | Discharge status: Home / Self Care | Payer: Medicare Other | Attending: Internal Medicine | Admitting: Internal Medicine

## 2016-05-16 ENCOUNTER — Emergency Department (HOSPITAL_COMMUNITY): Payer: Medicare Other

## 2016-05-16 DIAGNOSIS — K8020 Calculus of gallbladder without cholecystitis without obstruction: Secondary | ICD-10-CM | POA: Diagnosis not present

## 2016-05-16 DIAGNOSIS — T4145XA Adverse effect of unspecified anesthetic, initial encounter: Secondary | ICD-10-CM | POA: Diagnosis present

## 2016-05-16 DIAGNOSIS — Z823 Family history of stroke: Secondary | ICD-10-CM | POA: Diagnosis not present

## 2016-05-16 DIAGNOSIS — K8010 Calculus of gallbladder with chronic cholecystitis without obstruction: Principal | ICD-10-CM | POA: Diagnosis present

## 2016-05-16 DIAGNOSIS — Z8546 Personal history of malignant neoplasm of prostate: Secondary | ICD-10-CM | POA: Diagnosis not present

## 2016-05-16 DIAGNOSIS — K831 Obstruction of bile duct: Secondary | ICD-10-CM

## 2016-05-16 DIAGNOSIS — K660 Peritoneal adhesions (postprocedural) (postinfection): Secondary | ICD-10-CM | POA: Diagnosis present

## 2016-05-16 DIAGNOSIS — I482 Chronic atrial fibrillation: Secondary | ICD-10-CM | POA: Diagnosis present

## 2016-05-16 DIAGNOSIS — Z9849 Cataract extraction status, unspecified eye: Secondary | ICD-10-CM

## 2016-05-16 DIAGNOSIS — R932 Abnormal findings on diagnostic imaging of liver and biliary tract: Secondary | ICD-10-CM | POA: Diagnosis not present

## 2016-05-16 DIAGNOSIS — E876 Hypokalemia: Secondary | ICD-10-CM | POA: Diagnosis present

## 2016-05-16 DIAGNOSIS — I10 Essential (primary) hypertension: Secondary | ICD-10-CM | POA: Diagnosis present

## 2016-05-16 DIAGNOSIS — R945 Abnormal results of liver function studies: Secondary | ICD-10-CM

## 2016-05-16 DIAGNOSIS — Z974 Presence of external hearing-aid: Secondary | ICD-10-CM | POA: Diagnosis not present

## 2016-05-16 DIAGNOSIS — T8859XA Other complications of anesthesia, initial encounter: Secondary | ICD-10-CM | POA: Diagnosis present

## 2016-05-16 DIAGNOSIS — N139 Obstructive and reflux uropathy, unspecified: Secondary | ICD-10-CM | POA: Diagnosis present

## 2016-05-16 DIAGNOSIS — R7989 Other specified abnormal findings of blood chemistry: Secondary | ICD-10-CM | POA: Diagnosis not present

## 2016-05-16 DIAGNOSIS — Z923 Personal history of irradiation: Secondary | ICD-10-CM | POA: Diagnosis not present

## 2016-05-16 DIAGNOSIS — H3530 Unspecified macular degeneration: Secondary | ICD-10-CM | POA: Diagnosis present

## 2016-05-16 DIAGNOSIS — K810 Acute cholecystitis: Secondary | ICD-10-CM

## 2016-05-16 DIAGNOSIS — Z950 Presence of cardiac pacemaker: Secondary | ICD-10-CM

## 2016-05-16 DIAGNOSIS — Z7901 Long term (current) use of anticoagulants: Secondary | ICD-10-CM | POA: Diagnosis not present

## 2016-05-16 DIAGNOSIS — Z79899 Other long term (current) drug therapy: Secondary | ICD-10-CM

## 2016-05-16 DIAGNOSIS — K819 Cholecystitis, unspecified: Secondary | ICD-10-CM

## 2016-05-16 DIAGNOSIS — Z841 Family history of disorders of kidney and ureter: Secondary | ICD-10-CM

## 2016-05-16 HISTORY — DX: Cardiac arrhythmia, unspecified: I49.9

## 2016-05-16 LAB — CBC
HCT: 36 % — ABNORMAL LOW (ref 39.0–52.0)
Hemoglobin: 12.4 g/dL — ABNORMAL LOW (ref 13.0–17.0)
MCH: 31.9 pg (ref 26.0–34.0)
MCHC: 34.4 g/dL (ref 30.0–36.0)
MCV: 92.5 fL (ref 78.0–100.0)
PLATELETS: 219 10*3/uL (ref 150–400)
RBC: 3.89 MIL/uL — ABNORMAL LOW (ref 4.22–5.81)
RDW: 13.4 % (ref 11.5–15.5)
WBC: 7.5 10*3/uL (ref 4.0–10.5)

## 2016-05-16 LAB — COMPREHENSIVE METABOLIC PANEL
ALK PHOS: 136 U/L — AB (ref 38–126)
ALT: 159 U/L — AB (ref 17–63)
AST: 265 U/L — AB (ref 15–41)
Albumin: 4.5 g/dL (ref 3.5–5.0)
Anion gap: 8 (ref 5–15)
BUN: 21 mg/dL — AB (ref 6–20)
CALCIUM: 10.4 mg/dL — AB (ref 8.9–10.3)
CHLORIDE: 103 mmol/L (ref 101–111)
CO2: 25 mmol/L (ref 22–32)
CREATININE: 0.94 mg/dL (ref 0.61–1.24)
GFR calc Af Amer: >60 mL/min (ref >60)
Glucose, Bld: 130 mg/dL — ABNORMAL HIGH (ref 65–99)
Potassium: 3.5 mmol/L (ref 3.5–5.1)
Sodium: 136 mmol/L (ref 135–145)
Total Bilirubin: 2 mg/dL — ABNORMAL HIGH (ref 0.3–1.2)
Total Protein: 7.7 g/dL (ref 6.5–8.1)

## 2016-05-16 LAB — ABO/RH: ABO/RH(D): O POS

## 2016-05-16 LAB — URINALYSIS, ROUTINE W REFLEX MICROSCOPIC
Bacteria, UA: NONE SEEN
Bilirubin Urine: NEGATIVE
Glucose, UA: NEGATIVE mg/dL
Hgb urine dipstick: NEGATIVE
Ketones, ur: 5 mg/dL — AB
LEUKOCYTES UA: NEGATIVE
Nitrite: NEGATIVE
PH: 7 (ref 5.0–8.0)
Protein, ur: 100 mg/dL — AB
SQUAMOUS EPITHELIAL / LPF: NONE SEEN
Specific Gravity, Urine: 1.011 (ref 1.005–1.030)

## 2016-05-16 LAB — I-STAT TROPONIN, ED: Troponin i, poc: 0.01 ng/mL (ref 0.00–0.08)

## 2016-05-16 LAB — LIPASE, BLOOD: LIPASE: 50 U/L (ref 11–51)

## 2016-05-16 LAB — TYPE AND SCREEN
ABO/RH(D): O POS
Antibody Screen: NEGATIVE

## 2016-05-16 LAB — PROTIME-INR
INR: 2.22
PROTHROMBIN TIME: 25 s — AB (ref 11.4–15.2)

## 2016-05-16 MED ORDER — KETOROLAC TROMETHAMINE 30 MG/ML IJ SOLN: 30.0000 mg | Freq: Four times a day (QID) | INTRAMUSCULAR | Status: DC | PRN

## 2016-05-16 MED ORDER — TRAMADOL HCL 50 MG PO TABS
50.0000 mg | ORAL_TABLET | Freq: Four times a day (QID) | ORAL | Status: DC | PRN
  Administered 2016-05-18: 50 mg via ORAL
  Filled 2016-05-16: qty 1

## 2016-05-16 MED ORDER — SODIUM CHLORIDE 0.9 % IV SOLN
Freq: Once | INTRAVENOUS | Status: AC
  Administered 2016-05-16: 23:00:00 via INTRAVENOUS

## 2016-05-16 MED ORDER — DEXTROSE IN LACTATED RINGERS 5 % IV SOLN: INTRAVENOUS | Status: DC

## 2016-05-16 MED ORDER — AMLODIPINE BESYLATE 10 MG PO TABS
10.0000 mg | ORAL_TABLET | Freq: Every day | ORAL | Status: DC
  Administered 2016-05-17 – 2016-05-22 (×4): 10 mg via ORAL
  Filled 2016-05-16 (×4): qty 1

## 2016-05-16 MED ORDER — MORPHINE SULFATE (PF) 4 MG/ML IV SOLN
4.0000 mg | Freq: Once | INTRAVENOUS | Status: AC
  Administered 2016-05-16: 4 mg via INTRAVENOUS
  Filled 2016-05-16: qty 1

## 2016-05-16 MED ORDER — POTASSIUM CHLORIDE IN NACL 20-0.9 MEQ/L-% IV SOLN
INTRAVENOUS | Status: DC
  Administered 2016-05-16 – 2016-05-20 (×6): via INTRAVENOUS
  Filled 2016-05-16 (×9): qty 1000

## 2016-05-16 NOTE — ED Notes (Signed)
Bed: WA06 Expected date:  Expected time:  Means of arrival:  Comments: 

## 2016-05-16 NOTE — H&P (Signed)
Manuel Lewis [id number] DOB: 01-Jun-1921 DOA: 05/16/2016  Referring physician: [name] PCP: [name], MD  Specialists:  Gen. surgery [name] consult Chief Complaint: abd  HPI:  83 year old male Atrial fibrillation [place] score 3 Single chamber Medtronic pacemaker placed 07/2009-followed by Dr. Caryl Comes Hypertension Prostate cancer with seed implant patient 1998 History of urethral stricture dilatation 02/2016 [place]-urologist is [name] Macular degeneration Status post left inguinal hernia repair 08/2011  Admitting 05/16/2016 secondary to moderate to severe abdominal pain and 10 PM 05/15/16 no relation to food. Some nausea. No vomiting. No dark stool. No pars 2. No fever. No chills. No melena. No rash. No falls. Central epigastric radiating to the back, 8/10-relieved by morphine. Did not try any medication at home. Became more intolerable at 0 400 05/16/2016 and decided to come to the emergency room  In the emergency room received morphine 4 mg kept nothing by mouth and general surgery consulted  Bun /creat 21/0.94 AST/ALT-265/159 no WBC Abd [place] multiples  Review of Systems: The patient denies anorexia, fever, weight loss,, vision loss, decreased hearing, hoarseness, chest pain, syncope, dyspnea on exertion, peripheral edema, balance deficits, hemoptysis, abdominal pain, melena, hematochezia, severe indigestion/heartburn, hematuria, incontinence, genital sores, muscle weakness, suspicious skin lesions, transient blindness, difficulty walking, depression, unusual weight change, abnormal bleeding, enlarged lymph nodes, angioedema, and breast masses.    Past Medical History:  Diagnosis Date  . Chronic a-fib ([place])    since early 1990's with profound bradycardia and pauses; s/p  implantation  of Medtronic Adapta [id number], serial # [id number]  . Complication of anesthesia 1998   heart stopped, has not had general anesthesia since that time  .  Hypertension 1990  . Inguinal hernia   . Macular degeneration   . Nocturia   . Pacemaker   . Prostate cancer (Manuel Lewis) 1998   with seed implantation   Past Surgical History:  Procedure Laterality Date  . APPENDECTOMY  1941  . CARDIOVERSION     IN THE 1990's  . CATARACT EXTRACTION     2000 and 2007/ h/o macular degeneration since 2009  . HERNIA REPAIR     1945 and 1985  . HERNIA REPAIR  08/18/11   inguinal  . INGUINAL HERNIA REPAIR  08/18/2011   Procedure: HERNIA REPAIR INGUINAL ADULT;  Surgeon: [name], MD;  Location: [facility];  Service: General;  Laterality: Left;  Recurrent Left inguinal hernia repair  . INSERT / REPLACE / REMOVE PACEMAKER  08/14/09   IMPLANTATION OF SINGLE CHAMBER PULSE  GENERATING SYSTEM;   MODEL ADSR01, SERIAL # [id number] H ADAPTA  . THYROIDECTOMY, PARTIAL  1960   benign nodule  . TONSILLECTOMY    . US ECHOCARDIOGRAPHY  07/26/2009   EF 55-60%   Social History:  reports that he has never smoked. He does not have any smokeless tobacco history on file. He reports that he drinks about 4.2 oz of alcohol per week . He reports that he does not use drugs.  No Known Allergies  Family History  Problem Relation Age of Onset  . Stroke Mother   . Kidney failure Father      Prior to Admission medications   Medication Sig Start Date End Date Taking? Authorizing Provider  amLODipine (NORVASC) 10 MG tablet Take 10 mg by mouth daily with breakfast.    Yes Historical Provider, MD  beta carotene w/minerals (OCUVITE) tablet Take 1 tablet by mouth daily with breakfast.   Yes Historical Provider, MD  calcium-vitamin D (  OSCAL WITH D) 500-200 MG-UNIT tablet Take 1 tablet by mouth at bedtime.   Yes Historical Provider, MD  hydrochlorothiazide (,MICROZIDE/HYDRODIURIL,) 12.5 MG capsule Take 12.5 mg by mouth daily with breakfast.    Yes Historical Provider, MD  losartan (COZAAR) 100 MG tablet Take 100 mg by mouth daily with breakfast.    Yes Historical Provider, MD  warfarin  (COUMADIN) 2 MG tablet Take 1-2 mg by mouth every evening. Takes 1 mg everyday except on 2 mg on Monday's   Yes Historical Provider, MD   Physical Exam: Vitals:   05/16/16 1200 05/16/16 1300  BP: 136/74 135/69  Pulse: 62 61  Resp: 17 15  Temp:      Burt wasn't oriented none at anicteric, no JVD, no bruit, no submandibular lymphadenopathy T, chest clinically clear, S1-S2 paced rhythm pacemaker in situ Abdomen soft nontender no rebound no guarding Noted hernia right groin No lower extremity edema Neurologically intact moving all 4 limbs  Labs on Admission:  Basic Metabolic Panel:  Recent Labs Lab 05/16/16 0941  NA 136  K 3.5  CL 103  CO2 25  GLUCOSE 130*  BUN 21*  CREATININE 0.94  CALCIUM 10.4*   Liver Function Tests:  Recent Labs Lab 05/16/16 0941  AST 265*  ALT 159*  ALKPHOS 136*  BILITOT 2.0*  PROT 7.7  ALBUMIN 4.5    Recent Labs Lab 05/16/16 0941  LIPASE 50   No results for input(s): AMMONIA in the last 168 hours. CBC:  Recent Labs Lab 05/16/16 0941  WBC 7.5  HGB 12.4*  HCT 36.0*  MCV 92.5  PLT 219   Cardiac Enzymes: No results for input(s): CKTOTAL, CKMB, CKMBINDEX, TROPONINI in the last 168 hours.  BNP (last 3 results) No results for input(s): BNP in the last 8760 hours.  ProBNP (last 3 results) No results for input(s): PROBNP in the last 8760 hours.  CBG: No results for input(s): GLUCAP in the last 168 hours.  Radiological Exams on Admission: Dg Chest 2 View  Result Date: 05/16/2016 CLINICAL DATA:  Upper abdominal pain EXAM: CHEST  2 VIEW COMPARISON:  08/14/2011 FINDINGS: Single lead pacer lead in the right ventricle unchanged. Heart size upper normal. Negative for heart failure. Lungs are clear without infiltrate effusion or mass. IMPRESSION: No active cardiopulmonary disease. Electronically Signed   By: Franchot Gallo M.D.   On: 05/16/2016 10:00   US Abdomen Limited  Result Date: 05/16/2016 CLINICAL DATA:  Upper abdominal pain  EXAM: US ABDOMEN LIMITED - RIGHT UPPER QUADRANT COMPARISON:  None. FINDINGS: Gallbladder: Within the gallbladder, there are multiple echogenic foci which move and shadow consistent with gallstones. Largest individual gallstone measures 2.2 cm in length. Gallbladder wall is thickened and edematous. No pericholecystic fluid. No sonographic Murphy sign noted by sonographer. Common bile duct: Diameter: 3 mm proximally. Most of the common bile duct is obscured by gas. Liver: No focal lesion identified. Within normal limits in parenchymal echogenicity. IMPRESSION: Gallstones with gallbladder wall thickening. The appearance is concerning for acute cholecystitis. Only a small portion of the common bile duct is visualized; the small portion appears normal. No evident liver lesions. Electronically Signed   By: Lowella Grip III M.D.   On: 05/16/2016 12:43    EKG: Independently reviewed. Sinus with Paced rthymn.  Further interpretation difficult  Assessment/Plan Active Problems:   * No active hospital problems. *   1. Cholecystitis-bowel rest, start zosyn, LR 100 cc/h. Cmet 8 AM to trend LFTs. General surgery to formally determine if  drainage persists cholecystectomy is a possibility. Baed on ASA II-II and stable cardiac issues, probably low risk for Gen suegery-Gupta index 0.85% peri-op cardiac risk and form my perspective cleared for surgery 2. Atrial fibrillation Mali score 4, bradycardia status post pacemaker 2011-very stable  not requiring any antiarrhythmic. Check magnesium a.m. monitor on telemetry. Follow INR levels and defer Coumadin for now in the upper and given procedure will be planned 3. Prostate Ca s/p Radiation seeds 1998-[facility] with [facility]rolog[facility] at [facility] yearly 4. Obstructive uropathy s/p urethral dialtation-currently on an 5. Stable hypertension-hold HCTZ and losartan give IV fluids continue amlodipine 10 daily 6. Prior macular degeneration continue Ocuvite vitamins    Code Status:- Full code   Family Communication: called and discussed with the patient's son and Dr. [name]   Disposition Plan: inpatient   Time spent: [place], [facility] Pager [phone]  If 7PM-7AM, please contact night-coverage [email or web address] Password [email or web address] 05/16/2016, 1:51 PM

## 2016-05-16 NOTE — ED Notes (Signed)
US at bedside

## 2016-05-16 NOTE — ED Notes (Signed)
Patient transported to X-ray 

## 2016-05-16 NOTE — Progress Notes (Signed)
Pharmacy is consulted to start heparin in 83 yo male diagnosed with afib.  Pt is on warfarin PTA.  3/2 INR is 2.22.  Consulted with Dr. Verlon Au, and will start heparin drip once INR is less than 2.   Pt/INR ordered with AM labs.   Pharmacy will monitor and start when appropriate.   Royetta Asal, PharmD, BCPS Pager [phone] 05/16/2016 6:13 PM

## 2016-05-16 NOTE — Consult Note (Signed)
[facility] Surgery Consult/Admission Note  Manuel Lewis [birth year]  [id number].    Requesting MD: [name] Chief Complaint/Reason for Consult: abdominal pain, possible cholecystitis  HPI:   Pt is a 83 year old male with a history of A. Fib on Coumadin, HTN, right inguinal hernia repair, hx of appendectomy, prostate cancer who presented to the ED with complaints of abdominal pain that started around 10pm last night. Pt sudden onset of RUQ abdominal pain that was constant, severe, non radiating, morphine made it better, food does not seem to make it worse. Pt states the pain went away then came back around 4 AM this morning. Pt had a normal BM this morning. He denies nausea, vomiting, fever, chills, CP, SOB, dizziness, LOC, dysuria, hematuria, blood in his stool.   ED Course: VSS, afebrile Labs: BUN 21, Alk phos 136, AST 265, ALT 159, Tbili 2.0, Hg 12.4 [place] abd: revealed Gallstones with gallbladder wall thickening. The appearance is concerning for acute cholecystitis. Only a small portion of the common bile duct is visualized; the small portion appears normal. No evident liver lesions  ROS:  Review of Systems  Constitutional: Negative for chills, diaphoresis and fever.  HENT: Negative for congestion and sore throat.   Eyes: Negative for pain and discharge.  Respiratory: Negative for cough and shortness of breath.   Cardiovascular: Negative for chest pain.  Gastrointestinal: Positive for abdominal pain. Negative for blood in stool, constipation, diarrhea, nausea and vomiting.  Genitourinary: Negative for dysuria and hematuria.  Skin: Negative for rash.  Neurological: Negative for dizziness, loss of consciousness and headaches.  All other systems reviewed and are negative.    Family History  Problem Relation Age of Onset  . Stroke Mother   . Kidney failure Father     Past Medical History:  Diagnosis Date  . Chronic a-fib ([place])    since early 1990's with profound  bradycardia and pauses; s/p  implantation  of Medtronic Adapta [id number], serial # [id number]  . Complication of anesthesia 1998   heart stopped, has not had general anesthesia since that time  . Hypertension 1990  . Inguinal hernia   . Macular degeneration   . Nocturia   . Pacemaker   . Prostate cancer ([place]) 1998   with seed implantation    Past Surgical History:  Procedure Laterality Date  . APPENDECTOMY  1941  . CARDIOVERSION     IN THE 1990's  . CATARACT EXTRACTION     2000 and 2007/ h/o macular degeneration since 2009  . HERNIA REPAIR     1945 and 1985  . HERNIA REPAIR  08/18/11   inguinal  . INGUINAL HERNIA REPAIR  08/18/2011   Procedure: HERNIA REPAIR INGUINAL ADULT;  Surgeon: [name], MD;  Location: [facility];  Service: General;  Laterality: Left;  Recurrent Left inguinal hernia repair  . INSERT / REPLACE / REMOVE PACEMAKER  08/14/09   IMPLANTATION OF SINGLE CHAMBER PULSE  GENERATING SYSTEM;   MODEL ADSR01, SERIAL # [id number] H ADAPTA  . THYROIDECTOMY, PARTIAL  1960   benign nodule  . TONSILLECTOMY    . US ECHOCARDIOGRAPHY  07/26/2009   EF 55-60%    Social History:  reports that he has never smoked. He does not have any smokeless tobacco history on file. He reports that he drinks about 4.2 oz of alcohol per week . He reports that he does not use drugs.  Allergies: No Known Allergies   (Not in a hospital admission)  Blood pressure 135/69,  pulse 61, temperature 97.6 F (36.4 C), temperature source Oral, resp. rate 15, weight 150 lb (68 kg), SpO2 98 %.  Physical Exam  Constitutional: He is oriented to person, place, and time and well-developed, well-nourished, and in no distress. No distress.  Thin, well appearing, elderly white male  HENT:  Head: Normocephalic and atraumatic.  Nose: Nose normal.  Mouth/Throat: Mucous membranes are normal.  Eyes: Conjunctivae are normal. Right eye exhibits no discharge. Left eye exhibits no discharge. No scleral icterus.  Neck:  Normal range of motion. Neck supple.  Cardiovascular: S1 normal, S2 normal and normal heart sounds.  An irregularly irregular rhythm present.  No murmur heard. Pulses:      Radial pulses are 2+ on the right side, and 2+ on the left side.       Dorsalis pedis pulses are 2+ on the right side, and 2+ on the left side.  Pulmonary/Chest: Effort normal and breath sounds normal. He has no wheezes. He has no rhonchi. He has no rales.  Abdominal: Soft. Normal appearance and bowel sounds are normal. There is no tenderness. There is no rigidity, no rebound and no guarding.  Well healed scar noted to left inguinal region roughly 7cm in length, appears to be a similar scar on the right ungual region. Well healed scar in the RLQ.   Musculoskeletal: Normal range of motion. He exhibits edema (mild BLE edema). He exhibits no deformity.  Neurological: He is alert and oriented to person, place, and time.  Skin: Skin is warm and dry. No rash noted. He is not diaphoretic.  Psychiatric: Mood and affect normal.  Nursing note and vitals reviewed.   Results for orders placed or performed during the hospital encounter of 05/16/16 (from the past 48 hour(s))  Lipase, blood     Status: None   Collection Time: 05/16/16  9:41 AM  Result Value Ref Range   Lipase 50 11 - 51 U/L  Comprehensive metabolic panel     Status: Abnormal   Collection Time: 05/16/16  9:41 AM  Result Value Ref Range   Sodium 136 135 - 145 mmol/L   Potassium 3.5 3.5 - 5.1 mmol/L   Chloride 103 101 - 111 mmol/L   CO2 25 22 - 32 mmol/L   Glucose, Bld 130 (H) 65 - 99 mg/dL   BUN 21 (H) 6 - 20 mg/dL   Creatinine, Ser 0.94 0.61 - 1.24 mg/dL   Calcium 10.4 (H) 8.9 - 10.3 mg/dL   Total Protein 7.7 6.5 - 8.1 g/dL   Albumin 4.5 3.5 - 5.0 g/dL   AST 265 (H) 15 - 41 U/L   ALT 159 (H) 17 - 63 U/L   Alkaline Phosphatase 136 (H) 38 - 126 U/L   Total Bilirubin 2.0 (H) 0.3 - 1.2 mg/dL   GFR calc non Af Amer >60 >60 mL/min   GFR calc Af Amer >60 >60  mL/min    Comment: (NOTE) The eGFR has been calculated using the CKD EPI equation. This calculation has not been validated in all clinical situations. eGFR's persistently <60 mL/min signify possible Chronic Kidney Disease.    Anion gap 8 5 - 15  CBC     Status: Abnormal   Collection Time: 05/16/16  9:41 AM  Result Value Ref Range   WBC 7.5 4.0 - 10.5 K/uL   RBC 3.89 (L) 4.22 - 5.81 MIL/uL   Hemoglobin 12.4 (L) 13.0 - 17.0 g/dL   HCT 36.0 (L) 39.0 - 52.0 %  MCV 92.5 78.0 - 100.0 fL   MCH 31.9 26.0 - 34.0 pg   MCHC 34.4 30.0 - 36.0 g/dL   RDW 13.4 11.5 - 15.5 %   Platelets 219 150 - 400 K/uL  I-stat troponin, ED     Status: None   Collection Time: 05/16/16  9:50 AM  Result Value Ref Range   Troponin i, poc 0.01 0.00 - 0.08 ng/mL   Comment 3            Comment: Due to the release kinetics of cTnI, a negative result within the first hours of the onset of symptoms does not rule out myocardial infarction with certainty. If myocardial infarction is still suspected, repeat the test at appropriate intervals.   Urinalysis, Routine w reflex microscopic     Status: Abnormal   Collection Time: 05/16/16 10:51 AM  Result Value Ref Range   Color, Urine YELLOW YELLOW   APPearance CLEAR CLEAR   Specific Gravity, Urine 1.011 1.005 - 1.030   pH 7.0 5.0 - 8.0   Glucose, UA NEGATIVE NEGATIVE mg/dL   Hgb urine dipstick NEGATIVE NEGATIVE   Bilirubin Urine NEGATIVE NEGATIVE   Ketones, ur 5 (A) NEGATIVE mg/dL   Protein, ur 100 (A) NEGATIVE mg/dL   Nitrite NEGATIVE NEGATIVE   Leukocytes, UA NEGATIVE NEGATIVE   RBC / HPF 0-5 0 - 5 RBC/hpf   WBC, UA 0-5 0 - 5 WBC/hpf   Bacteria, UA NONE SEEN NONE SEEN   Squamous Epithelial / LPF NONE SEEN NONE SEEN   Dg Chest 2 View  Result Date: 05/16/2016 CLINICAL DATA:  Upper abdominal pain EXAM: CHEST  2 VIEW COMPARISON:  08/14/2011 FINDINGS: Single lead pacer lead in the right ventricle unchanged. Heart size upper normal. Negative for heart failure.  Lungs are clear without infiltrate effusion or mass. IMPRESSION: No active cardiopulmonary disease. Electronically Signed   By: Franchot Gallo M.D.   On: 05/16/2016 10:00   US Abdomen Limited  Result Date: 05/16/2016 CLINICAL DATA:  Upper abdominal pain EXAM: US ABDOMEN LIMITED - RIGHT UPPER QUADRANT COMPARISON:  None. FINDINGS: Gallbladder: Within the gallbladder, there are multiple echogenic foci which move and shadow consistent with gallstones. Largest individual gallstone measures 2.2 cm in length. Gallbladder wall is thickened and edematous. No pericholecystic fluid. No sonographic Murphy sign noted by sonographer. Common bile duct: Diameter: 3 mm proximally. Most of the common bile duct is obscured by gas. Liver: No focal lesion identified. Within normal limits in parenchymal echogenicity. IMPRESSION: Gallstones with gallbladder wall thickening. The appearance is concerning for acute cholecystitis. Only a small portion of the common bile duct is visualized; the small portion appears normal. No evident liver lesions. Electronically Signed   By: Lowella Grip III M.D.   On: 05/16/2016 12:43      Assessment/Plan HTN A. Fib on coumadin  Abdominal pain likely early acute cholecystitis or biliary cholic  - US shows gallstones (largest 2.2) and wall thickening without pericholecystic fluid - No WBC and afebrile - elevated LFT's and T bili 2.0 - medicine to admit and we appreciate their care and input  Plan: either surgery for cholecystectomy or perc chole drain. Will need to hold or reverse coumadin prior to any procedure. Will discuss definitive plan with [name].   Thank you for the consult. We will continue to follow this pt.  Kalman Drape, [facility] Surgery 05/16/2016, 1:56 PM Pager: [phone] Consults: [phone] Mon-Fri 7:00 am-4:30 pm Sat-Sun 7:00 am-11:30 am

## 2016-05-16 NOTE — ED Triage Notes (Signed)
Pt verbalizes mid upper abdominal pain without n/v/d, urinary symptoms, or CP onset 2200 last night; denies hx of same.

## 2016-05-16 NOTE — ED Provider Notes (Signed)
[facility] DEPT Provider Note   CSN: [id number] Arrival date & time: 05/16/16  [id number]     History   Chief Complaint Chief Complaint  Patient presents with  . Abdominal Pain    HPI Manuel Lewis is a 83 y.o. male.  HPI  Pt presenting with c/o right upper and mid upper abdominal pain which began last night.  Pain is intense and comes and goes.  Some mild nausea associated but no vomiting.  No fever/chills.  No diarrhea or change in stools.  He has not had similar pain in the past.  He does not feel the pain is worse after eating.  There are no other associated systemic symptoms, there are no other alleviating or modifying factors.   Past Medical History:  Diagnosis Date  . Chronic a-fib ([place])    since early 1990's with profound bradycardia and pauses; s/p  implantation  of Medtronic Adapta [id number], serial # [id number]  . Complication of anesthesia 1998   heart stopped, has not had general anesthesia since that time  . Hypertension 1990  . Inguinal hernia   . Macular degeneration   . Nocturia   . Pacemaker   . Prostate cancer ([place]) 1998   with seed implantation    Patient Active Problem List   Diagnosis Date Noted  . Acute cholecystitis 05/16/2016  . Edema 06/30/2012  . Hypertension 08/06/2010  . Atrial fibrillation ([place]) 08/06/2010  . Bradycardia 08/06/2010  . Pacemaker-Single chamber-MDT 08/06/2010    Past Surgical History:  Procedure Laterality Date  . APPENDECTOMY  1941  . CARDIOVERSION     IN THE 1990's  . CATARACT EXTRACTION     2000 and 2007/ h/o macular degeneration since 2009  . HERNIA REPAIR     1945 and 1985  . HERNIA REPAIR  08/18/11   inguinal  . INGUINAL HERNIA REPAIR  08/18/2011   Procedure: HERNIA REPAIR INGUINAL ADULT;  Surgeon: [name], MD;  Location: [facility];  Service: General;  Laterality: Left;  Recurrent Left inguinal hernia repair  . INSERT / REPLACE / REMOVE PACEMAKER  08/14/09   IMPLANTATION OF SINGLE CHAMBER PULSE  GENERATING  SYSTEM;   MODEL ADSR01, SERIAL # [id number] H ADAPTA  . THYROIDECTOMY, PARTIAL  1960   benign nodule  . TONSILLECTOMY    . US ECHOCARDIOGRAPHY  07/26/2009   EF 55-60%       Home Medications    Prior to Admission medications   Medication Sig Start Date End Date Taking? Authorizing Provider  amLODipine (NORVASC) 10 MG tablet Take 10 mg by mouth daily with breakfast.    Yes Historical Provider, MD  beta carotene w/minerals (OCUVITE) tablet Take 1 tablet by mouth daily with breakfast.   Yes Historical Provider, MD  calcium-vitamin D (OSCAL WITH D) 500-200 MG-UNIT tablet Take 1 tablet by mouth at bedtime.   Yes Historical Provider, MD  hydrochlorothiazide (,MICROZIDE/HYDRODIURIL,) 12.5 MG capsule Take 12.5 mg by mouth daily with breakfast.    Yes Historical Provider, MD  losartan (COZAAR) 100 MG tablet Take 100 mg by mouth daily with breakfast.    Yes Historical Provider, MD  warfarin (COUMADIN) 2 MG tablet Take 1-2 mg by mouth every evening. Takes 1 mg everyday except on 2 mg on Monday's   Yes Historical Provider, MD    Family History Family History  Problem Relation Age of Onset  . Stroke Mother   . Kidney failure Father     Social History Social History  Substance Use Topics  .  Smoking status: Never Smoker  . Smokeless tobacco: Never Used  . Alcohol use 4.2 oz/week    7 Glasses of wine per week     Comment: 1 GLASS OF WINE NIGHTLY     Allergies   Patient has no known allergies.   Review of Systems Review of Systems  ROS reviewed and all otherwise negative except for mentioned in HPI   Physical Exam Updated Vital Signs BP 135/69   Pulse 61   Temp 97.6 F (36.4 C) (Oral)   Resp 15   Wt 150 lb (68 kg)   SpO2 98%   BMI 22.15 kg/m  Vitals reviewed Physical Exam Physical Examination: General appearance - alert, well appearing, and in no distress Mental status - alert, oriented to person, place, and time Eyes - no conjunctival injection no scleral icterus Mouth -  mucous membranes moist, pharynx normal without lesions Chest - clear to auscultation, no wheezes, rales or rhonchi, symmetric air entry Heart - normal rate, regular rhythm, normal S1, S2, no murmurs, rubs, clicks or gallops Abdomen - soft, mild ttp in right upper abdomen, no gaurding or rebound tenderness, nondistended, no masses or organomegaly, nabs Neurological - alert, oriented, normal speech, no focal findings or movement disorder noted Extremities - peripheral pulses normal, no pedal edema, no clubbing or cyanosis Skin - normal coloration and turgor, no rashes  ED Treatments / Results  Labs (all labs ordered are listed, but only abnormal results are displayed) Labs Reviewed  COMPREHENSIVE METABOLIC PANEL - Abnormal; Notable for the following:       Result Value   Glucose, Bld 130 (*)    BUN 21 (*)    Calcium 10.4 (*)    AST 265 (*)    ALT 159 (*)    Alkaline Phosphatase 136 (*)    Total Bilirubin 2.0 (*)    All other components within normal limits  CBC - Abnormal; Notable for the following:    RBC 3.89 (*)    Hemoglobin 12.4 (*)    HCT 36.0 (*)    All other components within normal limits  URINALYSIS, ROUTINE W REFLEX MICROSCOPIC - Abnormal; Notable for the following:    Ketones, ur 5 (*)    Protein, ur 100 (*)    All other components within normal limits  PROTIME-INR - Abnormal; Notable for the following:    Prothrombin Time 25.0 (*)    All other components within normal limits  LIPASE, BLOOD  I-STAT TROPOININ, ED    EKG  EKG Interpretation  Date/Time:  Friday May 16 2016 09:32:22 EST Ventricular Rate:  80 PR Interval:    QRS Duration: 165 QT Interval:  431 QTC Calculation: 498 R Axis:   73 Text Interpretation:  Afib/flutter and ventricular-paced rhythm No further analysis attempted due to paced rhythm No significant change since last tracing Confirmed by [facility]  MD, MARTHA [phone]) on 05/16/2016 12:19:21 PM       Radiology Dg Chest 2 View  Result Date:  05/16/2016 CLINICAL DATA:  Upper abdominal pain EXAM: CHEST  2 VIEW COMPARISON:  08/14/2011 FINDINGS: Single lead pacer lead in the right ventricle unchanged. Heart size upper normal. Negative for heart failure. Lungs are clear without infiltrate effusion or mass. IMPRESSION: No active cardiopulmonary disease. Electronically Signed   By: Franchot Gallo M.D.   On: 05/16/2016 10:00   US Abdomen Limited  Result Date: 05/16/2016 CLINICAL DATA:  Upper abdominal pain EXAM: US ABDOMEN LIMITED - RIGHT UPPER QUADRANT COMPARISON:  None. FINDINGS:  Gallbladder: Within the gallbladder, there are multiple echogenic foci which move and shadow consistent with gallstones. Largest individual gallstone measures 2.2 cm in length. Gallbladder wall is thickened and edematous. No pericholecystic fluid. No sonographic Murphy sign noted by sonographer. Common bile duct: Diameter: 3 mm proximally. Most of the common bile duct is obscured by gas. Liver: No focal lesion identified. Within normal limits in parenchymal echogenicity. IMPRESSION: Gallstones with gallbladder wall thickening. The appearance is concerning for acute cholecystitis. Only a small portion of the common bile duct is visualized; the small portion appears normal. No evident liver lesions. Electronically Signed   By: Lowella Grip III M.D.   On: 05/16/2016 12:43    Procedures Procedures (including critical care time)  Medications Ordered in ED Medications  morphine 4 MG/ML injection 4 mg (4 mg Intravenous Given 05/16/16 1135)     Initial Impression / Assessment and Plan / ED Course  I have reviewed the triage vital signs and the nursing notes.  Pertinent labs & imaging results that were available during my care of the patient were reviewed by me and considered in my medical decision making (see chart for details).    1:10 PM d/w surgery, they will see patient in the ED for consultation  This patients CHA2DS2-VASc Score and unadjusted Ischemic Stroke  Rate (% per year) is equal to 4.8 % stroke rate/year from a score of 4  Above score calculated as 1 point each if present [CHF, HTN, DM, Vascular=MI/PAD/Aortic Plaque, Age if 65-74, or Male] Above score calculated as 2 points each if present [Age > 75, or Stroke/TIA/TE]       1:56 PM d/w Dr. Verlon Au, triad for admission.  He will see patient for admission.    Pt with cholecystitis, elevated LFTS, surgergy consulted, triad consulted for admission, they are starting zosyn.   Final Clinical Impressions(s) / ED Diagnoses   Final diagnoses:  Cholecystitis  Gallstones  Elevated liver function tests    New Prescriptions New Prescriptions   No medications on file     Alfonzo Beers, MD 05/16/16 1549

## 2016-05-17 ENCOUNTER — Encounter (HOSPITAL_COMMUNITY): Payer: Self-pay | Admitting: Cardiovascular Disease

## 2016-05-17 DIAGNOSIS — I482 Chronic atrial fibrillation: Secondary | ICD-10-CM

## 2016-05-17 LAB — PROTIME-INR
INR: 1.95
PROTHROMBIN TIME: 22.5 s — AB (ref 11.4–15.2)

## 2016-05-17 LAB — COMPREHENSIVE METABOLIC PANEL
ALBUMIN: 3.9 g/dL (ref 3.5–5.0)
ALT: 116 U/L — ABNORMAL HIGH (ref 17–63)
ANION GAP: 5 (ref 5–15)
AST: 105 U/L — ABNORMAL HIGH (ref 15–41)
Alkaline Phosphatase: 101 U/L (ref 38–126)
BUN: 15 mg/dL (ref 6–20)
CALCIUM: 9.3 mg/dL (ref 8.9–10.3)
CHLORIDE: 107 mmol/L (ref 101–111)
CO2: 27 mmol/L (ref 22–32)
Creatinine, Ser: 0.82 mg/dL (ref 0.61–1.24)
GFR calc non Af Amer: >60 mL/min (ref >60)
GLUCOSE: 96 mg/dL (ref 65–99)
POTASSIUM: 3.1 mmol/L — AB (ref 3.5–5.1)
SODIUM: 139 mmol/L (ref 135–145)
Total Bilirubin: 1.1 mg/dL (ref 0.3–1.2)
Total Protein: 6.3 g/dL — ABNORMAL LOW (ref 6.5–8.1)

## 2016-05-17 LAB — CBC
HEMATOCRIT: 30.2 % — AB (ref 39.0–52.0)
HEMOGLOBIN: 10.4 g/dL — AB (ref 13.0–17.0)
MCH: 32.2 pg (ref 26.0–34.0)
MCHC: 34.4 g/dL (ref 30.0–36.0)
MCV: 93.5 fL (ref 78.0–100.0)
Platelets: 181 10*3/uL (ref 150–400)
RBC: 3.23 MIL/uL — AB (ref 4.22–5.81)
RDW: 13.5 % (ref 11.5–15.5)
WBC: 4.5 10*3/uL (ref 4.0–10.5)

## 2016-05-17 LAB — HEPARIN LEVEL (UNFRACTIONATED): Heparin Unfractionated: 0.37 [IU]/mL (ref 0.30–0.70)

## 2016-05-17 MED ORDER — POTASSIUM CHLORIDE CRYS ER 20 MEQ PO TBCR
40.0000 meq | EXTENDED_RELEASE_TABLET | Freq: Two times a day (BID) | ORAL | Status: DC
  Administered 2016-05-17 (×2): 40 meq via ORAL
  Filled 2016-05-17 (×2): qty 2

## 2016-05-17 MED ORDER — HEPARIN (PORCINE) IN NACL 100-0.45 UNIT/ML-% IJ SOLN
1100.0000 [IU]/h | INTRAMUSCULAR | Status: AC
  Administered 2016-05-17: 1100 [IU]/h via INTRAVENOUS
  Filled 2016-05-17: qty 250

## 2016-05-17 MED ORDER — VITAMIN K1 10 MG/ML IJ SOLN: 5.0000 mg | Freq: Once | INTRAVENOUS | Status: DC

## 2016-05-17 MED ORDER — VITAMIN K1 10 MG/ML IJ SOLN
10.0000 mg | Freq: Once | INTRAVENOUS | Status: AC
  Administered 2016-05-17: 10 mg via INTRAVENOUS
  Filled 2016-05-17: qty 1

## 2016-05-17 NOTE — Consult Note (Addendum)
CONSULT NOTE  Date: 05/17/2016               Patient Name:  Manuel Lewis MRN: [id number]  DOB: Aug 03, 1921 Age / Sex: 83 y.o., male        PCP: [name] Primary Cardiologist: Caryl Comes            Referring Physician: Verlon Au              Reason for Consult: Pre-op evaluation prior to cholecystectomy            History of Present Illness: Patient is a 83 y.o. male with a PMHx of chronic AF, pacer, HTN, prostate cancer , who was admitted to [facility] on 05/16/2016 for evaluation of abdominal pain  .  No recent cardiac issues  lives with his wife independently. No CP , no dyspnea , able to do all of his chores without any isses Able to lie flat without problems  No pacer complications No recentl complications with anesthesia since getting pacer.    Medications: Outpatient medications: Prescriptions Prior to Admission  Medication Sig Dispense Refill Last Dose  . amLODipine (NORVASC) 10 MG tablet Take 10 mg by mouth daily with breakfast.    05/15/2016 at Unknown time  . beta carotene w/minerals (OCUVITE) tablet Take 1 tablet by mouth daily with breakfast.   05/15/2016 at Unknown time  . calcium-vitamin D (OSCAL WITH D) 500-200 MG-UNIT tablet Take 1 tablet by mouth at bedtime.   05/15/2016 at Unknown time  . hydrochlorothiazide (,MICROZIDE/HYDRODIURIL,) 12.5 MG capsule Take 12.5 mg by mouth daily with breakfast.    05/15/2016 at Unknown time  . losartan (COZAAR) 100 MG tablet Take 100 mg by mouth daily with breakfast.    05/15/2016 at Unknown time  . warfarin (COUMADIN) 2 MG tablet Take 1-2 mg by mouth every evening. Takes 1 mg everyday except on 2 mg on Monday's   05/15/2016 at 1900    Current medications: Current Facility-Administered Medications  Medication Dose Route Frequency Provider Last Rate Last Dose  . 0.9 % NaCl with KCl 20 mEq/ L  infusion   Intravenous Continuous Excell Seltzer, MD 75 mL/hr at 05/17/16 1331    . amLODipine (NORVASC) tablet 10 mg  10 mg Oral Q  breakfast Nita Sells, MD   10 mg at 05/17/16 0807  . heparin ADULT infusion 100 units/mL (25000 units/268mL sodium chloride 0.45%)  1,100 Units/hr Intravenous Continuous Alphonsa Overall, MD 11 mL/hr at 05/17/16 0831 1,100 Units/hr at 05/17/16 0831  . ketorolac (TORADOL) 30 MG/ML injection 30 mg  30 mg Intravenous Q6H PRN Nita Sells, MD      . potassium chloride SA (K-DUR,KLOR-CON) CR tablet 40 mEq  40 mEq Oral BID Alphonsa Overall, MD   40 mEq at 05/17/16 1328  . traMADol (ULTRAM) tablet 50 mg  50 mg Oral Q6H PRN Nita Sells, MD         No Known Allergies   Past Medical History:  Diagnosis Date  . Chronic a-fib ([place])    since early 1990's with profound bradycardia and pauses; s/p  implantation  of Medtronic Adapta [id number], serial # [id number]  . Complication of anesthesia 1998   bradycardia  . Hypertension 1990  . Inguinal hernia   . Macular degeneration   . Nocturia   . Pacemaker   . Prostate cancer ([place]) 1998   with seed implantation    Past Surgical History:  Procedure Laterality Date  . APPENDECTOMY  1941  .  CARDIOVERSION     IN THE 1990's  . CATARACT EXTRACTION     2000 and 2007/ h/o macular degeneration since 2009  . HERNIA REPAIR     1945 and 1985  . HERNIA REPAIR  08/18/11   inguinal  . INGUINAL HERNIA REPAIR  08/18/2011   Procedure: HERNIA REPAIR INGUINAL ADULT;  Surgeon: [name], MD;  Location: [place] [facility];  Service: General;  Laterality: Left;  Recurrent Left inguinal hernia repair  . INSERT / REPLACE / REMOVE PACEMAKER  08/14/09   IMPLANTATION OF SINGLE CHAMBER PULSE  GENERATING SYSTEM;   MODEL ADSR01, SERIAL # [id number] H ADAPTA  . THYROIDECTOMY, PARTIAL  1960   benign nodule  . TONSILLECTOMY    . US ECHOCARDIOGRAPHY  07/26/2009   EF 55-60%    Family History  Problem Relation Age of Onset  . Stroke Mother   . Kidney failure Father     Social History:  reports that he has never smoked. He has never used smokeless tobacco. He reports that  he drinks about 4.2 oz of alcohol per week . He reports that he does not use drugs.   Review of Systems: Constitutional:  admits to fever,   appetite change    HEENT: denies photophobia, eye pain, redness, hearing loss, ear pain, congestion, sore throat, rhinorrhea, sneezing, neck pain, neck stiffness and tinnitus.  Respiratory: denies SOB, DOE, cough, chest tightness, and wheezing.  Cardiovascular: denies chest pain, palpitations and leg swelling.  Gastrointestinal: admits to nausea,  Abdominal pain   Genitourinary: denies dysuria, urgency, frequency, hematuria, flank pain and difficulty urinating.  Musculoskeletal: denies  myalgias, back pain, joint swelling, arthralgias and gait problem.   Skin: denies pallor, rash and wound.  Neurological: denies dizziness, seizures, syncope, weakness, light-headedness, numbness and headaches.   Hematological: denies adenopathy, easy bruising, personal or family bleeding history.  Psychiatric/ Behavioral: denies suicidal ideation, mood changes, confusion, nervousness, sleep disturbance and agitation.    Physical Exam: BP 131/74 (BP Location: Left Arm)   Pulse 61   Temp 98.1 F (36.7 C) (Oral)   Resp 18   Ht 5\' 9"  (1.753 m)   Wt 147 lb 11.3 oz (67 kg)   SpO2 100%   BMI 21.81 kg/m   Wt Readings from Last 3 Encounters:  05/16/16 147 lb 11.3 oz (67 kg)  12/31/15 150 lb (68 kg)  07/19/14 152 lb 12.8 oz (69.3 kg)    General: Vital signs reviewed and noted. Well-developed, well-nourished, in no acute distress; alert,   Head: Normocephalic, atraumatic, sclera anicteric,   Neck: Supple. Negative for carotid bruits. No JVD   Lungs:  Clear bilaterally, no  wheezes, rales, or rhonchi. Breathing is normal   Heart: RRR with S1 S2. No murmurs, rubs, or gallops   Abdomen/ GI :  Soft, non-tender, non-distended with normoactive bowel sounds. No hepatomegaly. No rebound/guarding. No obvious abdominal masses   MSK: Strength and the appear normal for age.     Extremities: No clubbing or cyanosis. No edema.  Distal pedal pulses are 2+ and equal   Neurologic:  CN are grossly intact,  No obvious motor or sensory defect.  Alert and oriented X 3. Moves all extremities spontaneously.  Psych: Responds to questions appropriately with a normal affect.     Lab results: Basic Metabolic Panel:  Recent Labs Lab 05/16/16 0941 05/17/16 0542  NA 136 139  K 3.5 3.1*  CL 103 107  CO2 25 27  GLUCOSE 130* 96  BUN 21* 15  CREATININE 0.94 0.82  CALCIUM 10.4* 9.3    Liver Function Tests:  Recent Labs Lab 05/16/16 0941 05/17/16 0542  AST 265* 105*  ALT 159* 116*  ALKPHOS 136* 101  BILITOT 2.0* 1.1  PROT 7.7 6.3*  ALBUMIN 4.5 3.9    Recent Labs Lab 05/16/16 0941  LIPASE 50   No results for input(s): AMMONIA in the last 168 hours.  CBC:  Recent Labs Lab 05/16/16 0941 05/17/16 0542  WBC 7.5 4.5  HGB 12.4* 10.4*  HCT 36.0* 30.2*  MCV 92.5 93.5  PLT 219 181    Cardiac Enzymes: No results for input(s): CKTOTAL, CKMB, CKMBINDEX, TROPONINI in the last 168 hours.  BNP: Invalid input(s): POCBNP  CBG: No results for input(s): GLUCAP in the last 168 hours.  Coagulation Studies:  Recent Labs  05/16/16 0941 05/17/16 0542  LABPROT 25.0* 22.5*  INR 2.22 1.95     Other results: Personal review of EKG shows :  -chronic atrial fib with V pacing   Imaging: Dg Chest 2 View  Result Date: 05/16/2016 CLINICAL DATA:  Upper abdominal pain EXAM: CHEST  2 VIEW COMPARISON:  08/14/2011 FINDINGS: Single lead pacer lead in the right ventricle unchanged. Heart size upper normal. Negative for heart failure. Lungs are clear without infiltrate effusion or mass. IMPRESSION: No active cardiopulmonary disease. Electronically Signed   By: Franchot Gallo M.D.   On: 05/16/2016 10:00   US Abdomen Limited  Result Date: 05/16/2016 CLINICAL DATA:  Upper abdominal pain EXAM: US ABDOMEN LIMITED - RIGHT UPPER QUADRANT COMPARISON:  None. FINDINGS:  Gallbladder: Within the gallbladder, there are multiple echogenic foci which move and shadow consistent with gallstones. Largest individual gallstone measures 2.2 cm in length. Gallbladder wall is thickened and edematous. No pericholecystic fluid. No sonographic Murphy sign noted by sonographer. Common bile duct: Diameter: 3 mm proximally. Most of the common bile duct is obscured by gas. Liver: No focal lesion identified. Within normal limits in parenchymal echogenicity. IMPRESSION: Gallstones with gallbladder wall thickening. The appearance is concerning for acute cholecystitis. Only a small portion of the common bile duct is visualized; the small portion appears normal. No evident liver lesions. Electronically Signed   By: Lowella Grip III M.D.   On: 05/16/2016 12:43        Assessment & Plan:  1. Atrial fib:   Very stable.  Has a pacer.  No CP , dysnea, syncoope or presyncope He is at low risk for CV complications with surgery  Before he had his pacer, he had an episode of profound bradycardia associated with anesthesia.   He has not had any issues with recent anesthesia  INR is still elevated.   Received IV Vit K  Will recheck tomorrow       Ramond Dial., MD, [facility] 05/17/2016, 5:55 PM Office - [phone] Pager [phone]

## 2016-05-17 NOTE — Progress Notes (Signed)
[place] [facility] Office:  [phone] General Surgery Progress Note   LOS: 1 day  POD -     Assessment/Plan: 1.  Cholecystitis, cholelithiasis  LFT's are better  Can probably go with surgery first - but would like INR below 1.5.  I spoke with Dr. Collene Mares and she will hold off on the consult.  I spoke with Dr. Verlon Au who will ask cards to see him.  Will stop heparin at midnight and keep NPO at midnight and assess tomorrow AM  I spoke with son, Billye Hoppa, about plan  2. A. Fib  Has pacemaker  3.  Anticoagulated  Was on coumadin  PT/INR - 1.95/22.5 - 05/17/2016  Now on heparin 4.  HTN 5.  History of prostate cancer  Seed implant - 1998  6.  History of urethral stricture dilatation 02/2016 [facility]  urologist is [name] 7.   DVT prophylaxis - coming off coumadin 8.  HOH 9.  Hypokalemia  K+ - 3.1 - 05/17/2016   Active Problems:   Acute cholecystitis  Subjective:  Doing well.  Has not had any symptoms since her presented to the hospital.  He is [facility] and one of her hearing aids is without a battery.  Objective:   Vitals:   05/17/16 0548 05/17/16 0807  BP: 128/79 134/78  Pulse: 71 75  Resp: 18   Temp: 97.5 F (36.4 C)      Intake/Output from previous day:  03/02 0701 - 03/03 0700 In: 1489.4 [I.V.:932.5; Blood:556.9] Out: 1300 [Urine:1300]  Intake/Output this shift:  Total I/O In: 240 [P.O.:240] Out: -    Physical Exam:   General: Older WM who is alert and oriented.    HEENT: Normal. Pupils equal. .   Lungs: Clear   Abdomen: Soft   Lab Results:    Recent Labs  05/16/16 0941 05/17/16 0542  WBC 7.5 4.5  HGB 12.4* 10.4*  HCT 36.0* 30.2*  PLT 219 181    BMET   Recent Labs  05/16/16 0941 05/17/16 0542  NA 136 139  K 3.5 3.1*  CL 103 107  CO2 25 27  GLUCOSE 130* 96  BUN 21* 15  CREATININE 0.94 0.82  CALCIUM 10.4* 9.3    PT/INR   Recent Labs  05/16/16 0941 05/17/16 0542  LABPROT 25.0* 22.5*  INR 2.22 1.95     ABG  No results for input(s): PHART, HCO3 in the last 72 hours.  Invalid input(s): PCO2, PO2   Studies/Results:  Dg Chest 2 View  Result Date: 05/16/2016 CLINICAL DATA:  Upper abdominal pain EXAM: CHEST  2 VIEW COMPARISON:  08/14/2011 FINDINGS: Single lead pacer lead in the right ventricle unchanged. Heart size upper normal. Negative for heart failure. Lungs are clear without infiltrate effusion or mass. IMPRESSION: No active cardiopulmonary disease. Electronically Signed   By: Franchot Gallo M.D.   On: 05/16/2016 10:00   US Abdomen Limited  Result Date: 05/16/2016 CLINICAL DATA:  Upper abdominal pain EXAM: US ABDOMEN LIMITED - RIGHT UPPER QUADRANT COMPARISON:  None. FINDINGS: Gallbladder: Within the gallbladder, there are multiple echogenic foci which move and shadow consistent with gallstones. Largest individual gallstone measures 2.2 cm in length. Gallbladder wall is thickened and edematous. No pericholecystic fluid. No sonographic Murphy sign noted by sonographer. Common bile duct: Diameter: 3 mm proximally. Most of the common bile duct is obscured by gas. Liver: No focal lesion identified. Within normal limits in parenchymal echogenicity. IMPRESSION: Gallstones with gallbladder wall thickening. The appearance is  concerning for acute cholecystitis. Only a small portion of the common bile duct is visualized; the small portion appears normal. No evident liver lesions. Electronically Signed   By: Lowella Grip III M.D.   On: 05/16/2016 12:43     Anti-infectives:   Anti-infectives    None      Alphonsa Overall, MD, FACS Pager: [phone] Surgery Office: [phone] 05/17/2016

## 2016-05-17 NOTE — Progress Notes (Signed)
PROGRESS NOTE    Manuel Lewis  [id number] DOB: [birth year] DOA: 05/16/2016 PCP: [name], MD  Outpatient Specialists:     Brief Narrative:   83 year old male Atrial fibrillation [place] score 3 Single chamber Medtronic pacemaker placed 07/2009-followed by Dr. Caryl Comes Hypertension Prostate cancer with seed implant patient 1998 History of urethral stricture dilatation 02/2016 [place]-urologist is [name] Macular degeneration Status post left inguinal hernia repair 08/2011  Admitting 05/16/2016 secondary to moderate to severe abdominal pain and 10 PM 05/15/16 no relation to food. Some nausea. No vomiting. No dark stool. No pars 2. No fever. No chills. No melena. No rash. No falls. Central epigastric radiating to the back, 8/10-relieved by morphine. Did not try any medication at home. Became more intolerable at 0 400 05/16/2016 and decided to come to the emergency room  In the emergency room received morphine 4 mg kept nothing by mouth and general surgery consulted  Bun /creat 21/0.94 AST/ALT-265/159 no WBC Abd [place] multiples   Assessment & Plan:   Active Problems:   Acute cholecystitis    1. Cholecystitis vs passed CBG-bowel rest, start zosyn, LR 100 cc/h. Cmet 8 AM to trend LFTs. General surgery -input regarding drain versus ERCP + cholecystectomy to be determined. low risk for Gen suegery-Gupta index 0.85% peri-op cardiac risk and form my perspective cleared for surgery--clear liquid diet --as LFT still up will ask GI to see and comment 2. Atrial fibrillation [place] score 4, bradycardia status post pacemaker 2011-very stable  not requiring any antiarrhythmic.  monitor on telemetry.  INR level-- after FFP 3/2-->1.9.  Heparin per pharmacy ordered until decision about procedures 3. Prostate Ca s/p Radiation seeds 1998-follow with Urology at [facility] yearly 4. Obstructive uropathy s/p urethral dialtation-currently on an 5. Stable hypertension-hold HCTZ and  losartan give IV fluids continue amlodipine 10 daily 6. Prior macular degeneration continue Ocuvite vitamins   DVT prophylaxis: full dose heparin  Code Status: Full Family Communication:  Disposition Plan:    Consultants:   gen surg  GI  Procedures:     Antimicrobials:   zosyn    Subjective:   looks well, no new issues Eating clears no cp No absd pains  Objective: Vitals:   05/17/16 0102 05/17/16 0155 05/17/16 0548 05/17/16 0807  BP: 120/73 133/84 128/79 134/78  Pulse: 62 66 71 75  Resp: 18 18 18    Temp: 98.3 F (36.8 C) 98.3 F (36.8 C) 97.5 F (36.4 C)   TempSrc: Oral Oral Oral   SpO2: 98% 96% 98%   Weight:      Height:        Intake/Output Summary (Last 24 hours) at 05/17/16 1041 Last data filed at 05/17/16 0900  Gross per 24 hour  Intake          1729.41 ml  Output             1300 ml  Net           429.41 ml   Filed Weights   05/16/16 0932 05/16/16 1817  Weight: 68 kg (150 lb) 67 kg (147 lb 11.3 oz)    Examination:  General exam: Cane around in the halls comfortable  Respiratory system: Clear to auscultation. Respiratory effort normal. Cardiovascular system: S1 & S2 heard, RRR.  Gastrointestinal system: Abdomen is nondistended, soft and nontender. No reboudnnor guarding Central nervous system: Alert and oriented. No focal neurological deficits. Extremities: Symmetric 5 x 5 power. Skin: No rashes, lesions or ulcers Psychiatry: Judgement and  insight appear normal. Mood & affect appropriate.     Data Reviewed: I have personally reviewed following labs and imaging studies  CBC:  Recent Labs Lab 05/16/16 0941 05/17/16 0542  WBC 7.5 4.5  HGB 12.4* 10.4*  HCT 36.0* 30.2*  MCV 92.5 93.5  PLT 219 0000000   Basic Metabolic Panel:  Recent Labs Lab 05/16/16 0941 05/17/16 0542  NA 136 139  K 3.5 3.1*  CL 103 107  CO2 25 27  GLUCOSE 130* 96  BUN 21* 15  CREATININE 0.94 0.82  CALCIUM 10.4* 9.3   GFR: Estimated Creatinine  Clearance: 54.5 mL/min (by C-G formula based on SCr of 0.82 mg/dL). Liver Function Tests:  Recent Labs Lab 05/16/16 0941 05/17/16 0542  AST 265* 105*  ALT 159* 116*  ALKPHOS 136* 101  BILITOT 2.0* 1.1  PROT 7.7 6.3*  ALBUMIN 4.5 3.9    Recent Labs Lab 05/16/16 0941  LIPASE 50   No results for input(s): AMMONIA in the last 168 hours. Coagulation Profile:  Recent Labs Lab 05/16/16 0941 05/17/16 0542  INR 2.22 1.95   Cardiac Enzymes: No results for input(s): CKTOTAL, CKMB, CKMBINDEX, TROPONINI in the last 168 hours. BNP (last 3 results) No results for input(s): PROBNP in the last 8760 hours. HbA1C: No results for input(s): HGBA1C in the last 72 hours. CBG: No results for input(s): GLUCAP in the last 168 hours. Lipid Profile: No results for input(s): CHOL, HDL, LDLCALC, TRIG, CHOLHDL, LDLDIRECT in the last 72 hours. Thyroid Function Tests: No results for input(s): TSH, T4TOTAL, FREET4, T3FREE, THYROIDAB in the last 72 hours. Anemia Panel: No results for input(s): VITAMINB12, FOLATE, FERRITIN, TIBC, IRON, RETICCTPCT in the last 72 hours. Urine analysis:    Component Value Date/Time   COLORURINE YELLOW 05/16/2016 1051   APPEARANCEUR CLEAR 05/16/2016 1051   LABSPEC 1.011 05/16/2016 1051   PHURINE 7.0 05/16/2016 1051   GLUCOSEU NEGATIVE 05/16/2016 1051   HGBUR NEGATIVE 05/16/2016 1051   BILIRUBINUR NEGATIVE 05/16/2016 1051   KETONESUR 5 (A) 05/16/2016 1051   PROTEINUR 100 (A) 05/16/2016 1051   NITRITE NEGATIVE 05/16/2016 1051   LEUKOCYTESUR NEGATIVE 05/16/2016 1051   Sepsis Labs: @LABRCNTIP (procalcitonin:4,lacticidven:4)  )No results found for this or any previous visit (from the past 240 hour(s)).       Radiology Studies: Dg Chest 2 View  Result Date: 05/16/2016 CLINICAL DATA:  Upper abdominal pain EXAM: CHEST  2 VIEW COMPARISON:  08/14/2011 FINDINGS: Single lead pacer lead in the right ventricle unchanged. Heart size upper normal. Negative for heart  failure. Lungs are clear without infiltrate effusion or mass. IMPRESSION: No active cardiopulmonary disease. Electronically Signed   By: Franchot Gallo M.D.   On: 05/16/2016 10:00   US Abdomen Limited  Result Date: 05/16/2016 CLINICAL DATA:  Upper abdominal pain EXAM: US ABDOMEN LIMITED - RIGHT UPPER QUADRANT COMPARISON:  None. FINDINGS: Gallbladder: Within the gallbladder, there are multiple echogenic foci which move and shadow consistent with gallstones. Largest individual gallstone measures 2.2 cm in length. Gallbladder wall is thickened and edematous. No pericholecystic fluid. No sonographic Murphy sign noted by sonographer. Common bile duct: Diameter: 3 mm proximally. Most of the common bile duct is obscured by gas. Liver: No focal lesion identified. Within normal limits in parenchymal echogenicity. IMPRESSION: Gallstones with gallbladder wall thickening. The appearance is concerning for acute cholecystitis. Only a small portion of the common bile duct is visualized; the small portion appears normal. No evident liver lesions. Electronically Signed   By: Lowella Grip III  M.D.   On: 05/16/2016 12:43        Scheduled Meds: . amLODipine  10 mg Oral Q breakfast   Continuous Infusions: . 0.9 % NaCl with KCl 20 mEq / L 75 mL/hr at 05/16/16 2214  . heparin 1,100 Units/hr (05/17/16 0831)     LOS: 1 day    Time spent: [place], MD [facility]   If 7PM-7AM, please contact night-coverage [email or web address] Password [email or web address] 05/17/2016, 10:41 AM  \

## 2016-05-17 NOTE — Progress Notes (Signed)
ANTICOAGULATION CONSULT NOTE - Initial Consult  Pharmacy Consult for Heparin Indication: atrial fibrillation  No Known Allergies  Patient Measurements: Height: 5\' 9"  (175.3 cm) Weight: 147 lb 11.3 oz (67 kg) IBW/kg (Calculated) : 70.7 Heparin Dosing Weight:   Vital Signs: Temp: 97.5 F (36.4 C) (03/03 0548) Temp Source: Oral (03/03 0548) BP: 128/79 (03/03 0548) Pulse Rate: 71 (03/03 0548)  Labs:  Recent Labs  05/16/16 0941 05/17/16 0542  HGB 12.4* 10.4*  HCT 36.0* 30.2*  PLT 219 181  LABPROT 25.0* 22.5*  INR 2.22 1.95  CREATININE 0.94 0.82    Estimated Creatinine Clearance: 54.5 mL/min (by C-G formula based on SCr of 0.82 mg/dL).   Medical History: Past Medical History:  Diagnosis Date  . Chronic a-fib ([place])    since early 1990's with profound bradycardia and pauses; s/p  implantation  of Medtronic Adapta [id number], serial # [id number]  . Complication of anesthesia 1998   heart stopped, has not had general anesthesia since that time  . Hypertension 1990  . Inguinal hernia   . Macular degeneration   . Nocturia   . Pacemaker   . Prostate cancer ([place]) 1998   with seed implantation    Medications:  Prescriptions Prior to Admission  Medication Sig Dispense Refill Last Dose  . amLODipine (NORVASC) 10 MG tablet Take 10 mg by mouth daily with breakfast.    05/15/2016 at Unknown time  . beta carotene w/minerals (OCUVITE) tablet Take 1 tablet by mouth daily with breakfast.   05/15/2016 at Unknown time  . calcium-vitamin D (OSCAL WITH D) 500-200 MG-UNIT tablet Take 1 tablet by mouth at bedtime.   05/15/2016 at Unknown time  . hydrochlorothiazide (,MICROZIDE/HYDRODIURIL,) 12.5 MG capsule Take 12.5 mg by mouth daily with breakfast.    05/15/2016 at Unknown time  . losartan (COZAAR) 100 MG tablet Take 100 mg by mouth daily with breakfast.    05/15/2016 at Unknown time  . warfarin (COUMADIN) 2 MG tablet Take 1-2 mg by mouth every evening. Takes 1 mg everyday except on 2 mg on  Monday's   05/15/2016 at 1900   Scheduled:  . amLODipine  10 mg Oral Q breakfast   Infusions:  . 0.9 % NaCl with KCl 20 mEq / L 75 mL/hr at 05/16/16 2214  . heparin      Assessment: Patient with afib on warfarin PTA.  INR > on admit but < 2 this AM.   Pharmacy to start heparin without bolus when INR < 2.  Goal of Therapy:  Heparin level 0.3-0.7 units/ml Monitor platelets by anticoagulation protocol: Yes   Plan:  Heparin 1100 units/hr Heparin level at 1600 Daily CBC  Nani Skillern Crowford 05/17/2016,7:01 AM

## 2016-05-18 ENCOUNTER — Encounter (HOSPITAL_COMMUNITY)
Admission: EM | Disposition: A | Discharge status: Home / Self Care | Payer: Self-pay | Source: Home / Self Care | Attending: Family Medicine

## 2016-05-18 ENCOUNTER — Inpatient Hospital Stay (HOSPITAL_COMMUNITY): Payer: Medicare Other

## 2016-05-18 ENCOUNTER — Encounter (HOSPITAL_COMMUNITY): Payer: Self-pay | Admitting: Certified Registered Nurse Anesthetist

## 2016-05-18 ENCOUNTER — Inpatient Hospital Stay (HOSPITAL_COMMUNITY): Payer: Medicare Other | Admitting: Anesthesiology

## 2016-05-18 HISTORY — PX: CHOLECYSTECTOMY: SHX55

## 2016-05-18 LAB — CBC
HCT: 35.5 % — ABNORMAL LOW (ref 39.0–52.0)
Hemoglobin: 11.8 g/dL — ABNORMAL LOW (ref 13.0–17.0)
MCH: 31.5 pg (ref 26.0–34.0)
MCHC: 33.2 g/dL (ref 30.0–36.0)
MCV: 94.7 fL (ref 78.0–100.0)
PLATELETS: 223 10*3/uL (ref 150–400)
RBC: 3.75 MIL/uL — ABNORMAL LOW (ref 4.22–5.81)
RDW: 13.6 % (ref 11.5–15.5)
WBC: 7.2 10*3/uL (ref 4.0–10.5)

## 2016-05-18 LAB — COMPREHENSIVE METABOLIC PANEL
ALBUMIN: 4.5 g/dL (ref 3.5–5.0)
ALT: 99 U/L — ABNORMAL HIGH (ref 17–63)
ANION GAP: 9 (ref 5–15)
AST: 69 U/L — ABNORMAL HIGH (ref 15–41)
Alkaline Phosphatase: 102 U/L (ref 38–126)
BUN: 14 mg/dL (ref 6–20)
CO2: 24 mmol/L (ref 22–32)
Calcium: 9.9 mg/dL (ref 8.9–10.3)
Chloride: 105 mmol/L (ref 101–111)
Creatinine, Ser: 0.79 mg/dL (ref 0.61–1.24)
GFR calc non Af Amer: >60 mL/min (ref >60)
GLUCOSE: 99 mg/dL (ref 65–99)
POTASSIUM: 3.7 mmol/L (ref 3.5–5.1)
SODIUM: 138 mmol/L (ref 135–145)
Total Bilirubin: 1.6 mg/dL — ABNORMAL HIGH (ref 0.3–1.2)
Total Protein: 7.5 g/dL (ref 6.5–8.1)

## 2016-05-18 LAB — PROTIME-INR
INR: 1.28
Prothrombin Time: 16.1 seconds — ABNORMAL HIGH (ref 11.4–15.2)

## 2016-05-18 LAB — SURGICAL PCR SCREEN
MRSA, PCR: NEGATIVE
STAPHYLOCOCCUS AUREUS: NEGATIVE

## 2016-05-18 SURGERY — LAPAROSCOPIC CHOLECYSTECTOMY WITH INTRAOPERATIVE CHOLANGIOGRAM
Anesthesia: General | Site: Abdomen

## 2016-05-18 MED ORDER — SUGAMMADEX SODIUM 200 MG/2ML IV SOLN
INTRAVENOUS | Status: DC | PRN
  Administered 2016-05-18: 200 mg via INTRAVENOUS

## 2016-05-18 MED ORDER — BUPIVACAINE HCL (PF) 0.25 % IJ SOLN
INTRAMUSCULAR | Status: AC
  Filled 2016-05-18: qty 30

## 2016-05-18 MED ORDER — CEFAZOLIN SODIUM-DEXTROSE 2-3 GM-% IV SOLR
INTRAVENOUS | Status: DC | PRN
  Administered 2016-05-18: 2 g via INTRAVENOUS

## 2016-05-18 MED ORDER — FENTANYL CITRATE (PF) 100 MCG/2ML IJ SOLN
25.0000 ug | INTRAMUSCULAR | Status: DC | PRN
  Administered 2016-05-18 (×2): 50 ug via INTRAVENOUS

## 2016-05-18 MED ORDER — GLUCAGON HCL RDNA (DIAGNOSTIC) 1 MG IJ SOLR
INTRAMUSCULAR | Status: AC
  Filled 2016-05-18: qty 1

## 2016-05-18 MED ORDER — CEFAZOLIN SODIUM-DEXTROSE 2-4 GM/100ML-% IV SOLN
INTRAVENOUS | Status: AC
  Filled 2016-05-18: qty 100

## 2016-05-18 MED ORDER — MORPHINE SULFATE (PF) 4 MG/ML IV SOLN: 1.0000 mg | INTRAVENOUS | Status: DC | PRN

## 2016-05-18 MED ORDER — BUPIVACAINE-EPINEPHRINE 0.25% -1:200000 IJ SOLN
INTRAMUSCULAR | Status: DC | PRN
  Administered 2016-05-18: 20 mL

## 2016-05-18 MED ORDER — POTASSIUM CHLORIDE CRYS ER 20 MEQ PO TBCR
40.0000 meq | EXTENDED_RELEASE_TABLET | Freq: Every day | ORAL | Status: DC
  Administered 2016-05-19 – 2016-05-22 (×3): 40 meq via ORAL
  Filled 2016-05-18 (×3): qty 2

## 2016-05-18 MED ORDER — PROPOFOL 10 MG/ML IV BOLUS
INTRAVENOUS | Status: DC | PRN
  Administered 2016-05-18: 80 mg via INTRAVENOUS

## 2016-05-18 MED ORDER — 0.9 % SODIUM CHLORIDE (POUR BTL) OPTIME
TOPICAL | Status: DC | PRN
  Administered 2016-05-18: 1000 mL

## 2016-05-18 MED ORDER — SUCCINYLCHOLINE CHLORIDE 200 MG/10ML IV SOSY
PREFILLED_SYRINGE | INTRAVENOUS | Status: AC
  Filled 2016-05-18: qty 20

## 2016-05-18 MED ORDER — ONDANSETRON HCL 4 MG/2ML IJ SOLN: 4.0000 mg | Freq: Four times a day (QID) | INTRAMUSCULAR | Status: DC | PRN

## 2016-05-18 MED ORDER — HYDROCODONE-ACETAMINOPHEN 5-325 MG PO TABS: 1.0000 | ORAL_TABLET | Freq: Four times a day (QID) | ORAL | Status: DC | PRN

## 2016-05-18 MED ORDER — GLUCAGON HCL RDNA (DIAGNOSTIC) 1 MG IJ SOLR
INTRAMUSCULAR | Status: DC | PRN
  Administered 2016-05-18: 1 mg via INTRAVENOUS

## 2016-05-18 MED ORDER — ROCURONIUM BROMIDE 10 MG/ML (PF) SYRINGE
PREFILLED_SYRINGE | INTRAVENOUS | Status: DC | PRN
  Administered 2016-05-18: 10 mg via INTRAVENOUS
  Administered 2016-05-18: 40 mg via INTRAVENOUS

## 2016-05-18 MED ORDER — FENTANYL CITRATE (PF) 250 MCG/5ML IJ SOLN
INTRAMUSCULAR | Status: AC
  Filled 2016-05-18: qty 5

## 2016-05-18 MED ORDER — LIDOCAINE 2% (20 MG/ML) 5 ML SYRINGE
INTRAMUSCULAR | Status: DC | PRN
  Administered 2016-05-18: 60 mg via INTRAVENOUS

## 2016-05-18 MED ORDER — MORPHINE SULFATE (PF) 10 MG/ML IV SOLN: 1.0000 mg | INTRAVENOUS | Status: DC | PRN

## 2016-05-18 MED ORDER — LACTATED RINGERS IR SOLN
Status: DC | PRN
  Administered 2016-05-18: 1000 mL

## 2016-05-18 MED ORDER — IOPAMIDOL (ISOVUE-300) INJECTION 61%
INTRAVENOUS | Status: DC | PRN
  Administered 2016-05-18: 40 mL

## 2016-05-18 MED ORDER — IOPAMIDOL (ISOVUE-300) INJECTION 61%
INTRAVENOUS | Status: AC
  Filled 2016-05-18: qty 50

## 2016-05-18 MED ORDER — FENTANYL CITRATE (PF) 100 MCG/2ML IJ SOLN
INTRAMUSCULAR | Status: DC | PRN
  Administered 2016-05-18 (×3): 50 ug via INTRAVENOUS

## 2016-05-18 MED ORDER — FENTANYL CITRATE (PF) 100 MCG/2ML IJ SOLN
INTRAMUSCULAR | Status: AC
  Filled 2016-05-18: qty 2

## 2016-05-18 MED ORDER — LACTATED RINGERS IV SOLN
INTRAVENOUS | Status: DC | PRN
  Administered 2016-05-18 (×2): via INTRAVENOUS

## 2016-05-18 MED ORDER — ROCURONIUM BROMIDE 50 MG/5ML IV SOSY
PREFILLED_SYRINGE | INTRAVENOUS | Status: AC
  Filled 2016-05-18: qty 10

## 2016-05-18 MED ORDER — ONDANSETRON HCL 4 MG/2ML IJ SOLN
INTRAMUSCULAR | Status: DC | PRN
  Administered 2016-05-18: 4 mg via INTRAVENOUS

## 2016-05-18 MED ORDER — CEFAZOLIN IN D5W 1 GM/50ML IV SOLN
1.0000 g | Freq: Three times a day (TID) | INTRAVENOUS | Status: AC
  Administered 2016-05-18 – 2016-05-19 (×2): 1 g via INTRAVENOUS
  Filled 2016-05-18 (×3): qty 50

## 2016-05-18 SURGICAL SUPPLY — 45 items
ADH SKN CLS APL DERMABOND .7 (GAUZE/BANDAGES/DRESSINGS) ×1
APL SKNCLS STERI-STRIP NONHPOA (GAUZE/BANDAGES/DRESSINGS)
APPLIER CLIP 5 13 M/L LIGAMAX5 (MISCELLANEOUS) ×3
APPLIER CLIP ROT 10 11.4 M/L (STAPLE) ×3
APR CLP MED LRG 11.4X10 (STAPLE) ×1
APR CLP MED LRG 5 ANG JAW (MISCELLANEOUS) ×1
BAG SPEC RTRVL LRG 6X4 10 (ENDOMECHANICALS) ×1
BENZOIN TINCTURE PRP APPL 2/3 (GAUZE/BANDAGES/DRESSINGS) IMPLANT
CABLE HIGH FREQUENCY MONO STRZ (ELECTRODE) ×3 IMPLANT
CATH EMB 4FR 40CM (CATHETERS) ×2 IMPLANT
CHLORAPREP W/TINT 26ML (MISCELLANEOUS) ×3 IMPLANT
CHOLANGIOGRAM CATH TAUT (CATHETERS) ×3 IMPLANT
CLIP APPLIE 5 13 M/L LIGAMAX5 (MISCELLANEOUS) IMPLANT
CLIP APPLIE ROT 10 11.4 M/L (STAPLE) IMPLANT
CLOSURE WOUND 1/4X4 (GAUZE/BANDAGES/DRESSINGS)
COVER MAYO STAND STRL (DRAPES) ×3 IMPLANT
COVER SURGICAL LIGHT HANDLE (MISCELLANEOUS) ×1 IMPLANT
DECANTER SPIKE VIAL GLASS SM (MISCELLANEOUS) ×3 IMPLANT
DERMABOND ADVANCED (GAUZE/BANDAGES/DRESSINGS) ×2
DERMABOND ADVANCED .7 DNX12 (GAUZE/BANDAGES/DRESSINGS) IMPLANT
DRAPE C-ARM 42X120 X-RAY (DRAPES) ×3 IMPLANT
ELECT REM PT RETURN 9FT ADLT (ELECTROSURGICAL) ×3
ELECTRODE REM PT RTRN 9FT ADLT (ELECTROSURGICAL) ×1 IMPLANT
GLOVE SURG SIGNA 7.5 PF LTX (GLOVE) ×3 IMPLANT
GOWN STRL REUS W/TWL XL LVL3 (GOWN DISPOSABLE) ×9 IMPLANT
HEMOSTAT SURGICEL 4X8 (HEMOSTASIS) IMPLANT
IRRIG SUCT STRYKERFLOW 2 WTIP (MISCELLANEOUS) ×3
IRRIGATION SUCT STRKRFLW 2 WTP (MISCELLANEOUS) ×1 IMPLANT
IV CATH 14GX2 1/4 (CATHETERS) ×3 IMPLANT
IV SET EXTENSION CATH 6 NF (IV SETS) ×3 IMPLANT
KIT BASIN OR (CUSTOM PROCEDURE TRAY) ×3 IMPLANT
POUCH SPECIMEN RETRIEVAL 10MM (ENDOMECHANICALS) ×3 IMPLANT
SCISSORS LAP 5X35 DISP (ENDOMECHANICALS) ×3 IMPLANT
SLEEVE ADV FIXATION 5X100MM (TROCAR) ×3 IMPLANT
STOPCOCK 4 WAY LG BORE MALE ST (IV SETS) ×3 IMPLANT
STRIP CLOSURE SKIN 1/4X4 (GAUZE/BANDAGES/DRESSINGS) IMPLANT
SUT MNCRL AB 4-0 PS2 18 (SUTURE) ×3 IMPLANT
SYR 10ML ECCENTRIC (SYRINGE) ×3 IMPLANT
TOWEL OR 17X26 10 PK STRL BLUE (TOWEL DISPOSABLE) ×3 IMPLANT
TOWEL OR NON WOVEN STRL DISP B (DISPOSABLE) ×3 IMPLANT
TRAY LAPAROSCOPIC (CUSTOM PROCEDURE TRAY) ×3 IMPLANT
TROCAR ADV FIXATION 11X100MM (TROCAR) IMPLANT
TROCAR ADV FIXATION 5X100MM (TROCAR) ×3 IMPLANT
TROCAR XCEL BLUNT TIP 100MML (ENDOMECHANICALS) ×3 IMPLANT
TUBING INSUF HEATED (TUBING) ×3 IMPLANT

## 2016-05-18 NOTE — Op Note (Signed)
05/16/2016 - 05/18/2016  12:37 PM  PATIENT:  Manuel Lewis, 83 y.o., male, MRN: [id number]  PREOP DIAGNOSIS:  Cholecystitis, cholelithiasis  POSTOP DIAGNOSIS:   Chronic cholecystitis, cholelithiasis, possible CBD stricture on cholangiogram  PROCEDURE:   Procedure(s): LAPAROSCOPIC CHOLECYSTECTOMY WITH INTRAOPERATIVE CHOLANGIOGRAM, Common bile exploration with #4 biliary fogarty  SURGEON:   Alphonsa Overall, M.D.  Terrence DupontBrien Mates, M.D.  ANESTHESIA:   general  Anesthesiologist: Duane Boston, MD CRNA: Gean Maidens, CRNA  General  ASA: 3  EBL:  minimal  ml  BLOOD ADMINISTERED: none  DRAINS: none   LOCAL MEDICATIONS USED:   30 cc 1/4% marcaine  SPECIMEN:   Gall bladder  COUNTS CORRECT:  YES  INDICATIONS FOR PROCEDURE:  Manuel Lewis is a 83 y.o. (DOB: [birth year]) white male whose primary care physician is [name], MD and comes for cholecystectomy.   He had a transient elevation of his liver functions, which have improved.   The indications and risks of the gall bladder surgery were explained to the patient.  The risks include, but are not limited to, infection, bleeding, common bile duct injury and open surgery.  SURGERY:  The patient was taken to OR room #1 at [facility].  The abdomen was prepped with chloroprep.  The patient was given 2 gm Ancef at the beginning of the operation.   A time out was held and the surgical checklist run.   An infraumbilical incision was made into the abdominal cavity.  A 12 mm Hasson trocar was inserted into the abdominal cavity through the infraumbilical incision and secured with a 0 Vicryl suture.  Three additional trocars were inserted: a 10 mm trocar in the sub-xiphoid location, a 5 mm trocar in the right mid subcostal area, and a 5 mm trocar in the right lateral subcostal area.   The abdomen was explored and the stomach, and bowel that could be seen were unremarkable.  His right lobe of his liver was smaller than  normal, pulling his gall bladder higher than normal.  He had an adhesion between his right abdomen and towards midline.  I saw no intra-adominal evidence of cancer.  Photos of his liver were taken and placed in the chart.   The gall bladder had evidence of chronic cholecystitis with some adhesions of omentum to the gall bladder.   I grasped the gall bladder and rotated it cephalad.  Disssection was carried down to the gall bladder/cystic duct junction and the cystic duct isolated.  A clip was placed on the gall bladder side of the cystic duct.   An intra-operative cholangiogram was shot.   The intra-operative cholangiogram was shot using a cut off Taut catheter placed through a 14 gauge angiocath in the RUQ.  The Taut catheter was inserted in the cut cystic duct and secured with an endoclip.  A cholangiogram was shot with 20 cc of 1/2 strength Isoview.  Using fluoroscopy, the cholangiogram showed the flow of contrast into the common bile duct, up the hepatic radicals, but there was a tapered.area of the mid common bile duct.  I gave the patient glucagon (1 mg) and then "explored" the common bile duct with a #4 Fogarty balloon.  I did get some stone debris out of the cystic duct.  I then shot another cholangiogram.  This time there was contrast in the duodenum and the impression of a CBD narrowing.  This will need further evaluation.   The Taut catheter was removed.  The cystic  duct was tripley endoclipped and the cystic artery was identified and clipped.  The gall bladder was bluntly and sharpley dissected from the gall bladder bed.   After the gall bladder was removed from the liver, the gall bladder bed and [place] were inspected.  There was no bleeding or bile leak.  The gall bladder was placed in a endocatch bag and delivered through the umbilicus.  The abdomen was irrigated with 2,000 cc saline.   The trocars were then removed.  I infiltrated 30 cc of 1/4% Marcaine into the incisions.  The  umbilical port closed with a 0 Vicryl suture and the skin closed with 4-0 Monocryl.  The skin was painted with DermaBond.  The patient's sponge and needle count were correct.  The patient was transported to the RR in good condition.  Alphonsa Overall, MD, [facility] Surgery Pager: [phone] Office phone:  [phone]

## 2016-05-18 NOTE — Progress Notes (Signed)
Pharmacy - Heparin IV  Assessment: 69 yoM on warfarin for Afib, bridged with heparin for surgery. Heparin turned off yesterday at 11p in preparation for surgery this AM.  Plan: Await instructions from surgery for resumption of heparin +/- warfarin  Reuel Boom, PharmD, BCPS Pager: [phone] 05/18/2016, 10:36 AM

## 2016-05-18 NOTE — Progress Notes (Signed)
Progress Note  Patient Name: Manuel Lewis Date of Encounter: 05/18/2016  Primary Cardiologist: Caryl Comes  Subjective   83 y.o. male with a PMHx of chronic AF, pacer, HTN, prostate cancer , who was admitted to [facility] on 05/16/2016 for evaluation of abdominal pain  Found to have cholecystitis.  INR has been reversed. He has been cleared for surgery .   Should be at low risk for CV complications  Inpatient Medications    Scheduled Meds: . amLODipine  10 mg Oral Q breakfast  . potassium chloride SA  40 mEq Oral BID   Continuous Infusions: . 0.9 % NaCl with KCl 20 mEq / L 75 mL/hr at 05/17/16 2119   PRN Meds: ketorolac, traMADol   Vital Signs    Vitals:   05/17/16 0807 05/17/16 1351 05/17/16 2110 05/18/16 0543  BP: 134/78 131/74 127/77 139/86  Pulse: 75 61 81 76  Resp:  18 17 18   Temp:  98.1 F (36.7 C) 97.7 F (36.5 C) 98.2 F (36.8 C)  TempSrc:  Oral Oral Oral  SpO2:  100% 99% 100%  Weight:      Height:        Intake/Output Summary (Last 24 hours) at 05/18/16 0749 Last data filed at 05/18/16 0600  Gross per 24 hour  Intake          2730.32 ml  Output             1600 ml  Net          1130.32 ml   Filed Weights   05/16/16 0932 05/16/16 1817  Weight: 150 lb (68 kg) 147 lb 11.3 oz (67 kg)    Telemetry    Atrial fib with V pacing  - Personally Reviewed  ECG    Atrial fib with V pacing   - Personally Reviewed  Physical Exam   GEN: No acute distress.   Neck: No JVD Cardiac: RRR, no murmurs, rubs, or gallops.  Respiratory: Clear to auscultation bilaterally. GI: Soft, nontender, non-distended  MS: No edema; No deformity. Neuro:  Nonfocal  Psych: Normal affect   Labs    Chemistry Recent Labs Lab 05/16/16 0941 05/17/16 0542 05/18/16 0604  NA 136 139 138  K 3.5 3.1* 3.7  CL 103 107 105  CO2 25 27 24   GLUCOSE 130* 96 99  BUN 21* 15 14  CREATININE 0.94 0.82 0.79  CALCIUM 10.4* 9.3 9.9  PROT 7.7 6.3* 7.5  ALBUMIN 4.5 3.9 4.5  AST 265* 105* 69*    ALT 159* 116* 99*  ALKPHOS 136* 101 102  BILITOT 2.0* 1.1 1.6*  GFRNONAA >60 >60 >60  GFRAA >60 >60 >60  ANIONGAP 8 5 9      Hematology Recent Labs Lab 05/16/16 0941 05/17/16 0542 05/18/16 0604  WBC 7.5 4.5 7.2  RBC 3.89* 3.23* 3.75*  HGB 12.4* 10.4* 11.8*  HCT 36.0* 30.2* 35.5*  MCV 92.5 93.5 94.7  MCH 31.9 32.2 31.5  MCHC 34.4 34.4 33.2  RDW 13.4 13.5 13.6  PLT 219 181 223    Cardiac EnzymesNo results for input(s): TROPONINI in the last 168 hours.  Recent Labs Lab 05/16/16 0950  TROPIPOC 0.01     BNPNo results for input(s): BNP, PROBNP in the last 168 hours.   DDimer No results for input(s): DDIMER in the last 168 hours.   Radiology    Dg Chest 2 View  Result Date: 05/16/2016 CLINICAL DATA:  Upper abdominal pain EXAM: CHEST  2 VIEW COMPARISON:  08/14/2011 FINDINGS: Single lead pacer lead in the right ventricle unchanged. Heart size upper normal. Negative for heart failure. Lungs are clear without infiltrate effusion or mass. IMPRESSION: No active cardiopulmonary disease. Electronically Signed   By: Franchot Gallo M.D.   On: 05/16/2016 10:00   US Abdomen Limited  Result Date: 05/16/2016 CLINICAL DATA:  Upper abdominal pain EXAM: US ABDOMEN LIMITED - RIGHT UPPER QUADRANT COMPARISON:  None. FINDINGS: Gallbladder: Within the gallbladder, there are multiple echogenic foci which move and shadow consistent with gallstones. Largest individual gallstone measures 2.2 cm in length. Gallbladder wall is thickened and edematous. No pericholecystic fluid. No sonographic Murphy sign noted by sonographer. Common bile duct: Diameter: 3 mm proximally. Most of the common bile duct is obscured by gas. Liver: No focal lesion identified. Within normal limits in parenchymal echogenicity. IMPRESSION: Gallstones with gallbladder wall thickening. The appearance is concerning for acute cholecystitis. Only a small portion of the common bile duct is visualized; the small portion appears normal. No  evident liver lesions. Electronically Signed   By: Lowella Grip III M.D.   On: 05/16/2016 12:43    Cardiac Studies      Patient Profile     83 y.o. male with a PMHx of chronic AF, pacer, HTN, prostate cancer , who was admitted to [facility] on 05/16/2016 for evaluation of abdominal pain  .    Assessment & Plan    1.  Atrial fib:   Rate is well controlled.  S/p pacer for hx of bradycardia in the past    INR is now 1.26  Ready for surgery  He is at low risk for CV complications with cholecystectomy    Restart coumadin when OK with Dr. Lucia Gaskins  ( coumadin per pharmacy protocol )   Signed, Mertie Moores, MD  05/18/2016, 7:49 AM

## 2016-05-18 NOTE — Anesthesia Postprocedure Evaluation (Addendum)
Anesthesia Post Note  Patient: SALMAN BORTNER  Procedure(s) Performed: Procedure(s) (LRB): LAPAROSCOPIC CHOLECYSTECTOMY WITH INTRAOPERATIVE CHOLANGIOGRAM (N/A)  Patient location during evaluation: PACU Anesthesia Type: General Level of consciousness: sedated Pain management: pain level controlled Vital Signs Assessment: post-procedure vital signs reviewed and stable Respiratory status: spontaneous breathing and respiratory function stable Cardiovascular status: stable Anesthetic complications: no       Last Vitals:  Vitals:   05/18/16 1330 05/18/16 1337  BP: 130/67   Pulse: (!) 58 62  Resp: 13 14  Temp:      Last Pain:  Vitals:   05/18/16 1337  TempSrc:   PainSc: 6                  Vincent Streater DANIEL

## 2016-05-18 NOTE — Anesthesia Procedure Notes (Signed)
Procedure Name: Intubation Performed by: Gean Maidens Pre-anesthesia Checklist: Patient identified, Emergency Drugs available, Suction available, Patient being monitored and Timeout performed Patient Re-evaluated:Patient Re-evaluated prior to inductionOxygen Delivery Method: Circle system utilized Preoxygenation: Pre-oxygenation with 100% oxygen Intubation Type: IV induction Ventilation: Mask ventilation without difficulty Laryngoscope Size: Mac and 3 Grade View: Grade I Tube type: Oral Tube size: 7.5 mm Number of attempts: 1 Airway Equipment and Method: Stylet Placement Confirmation: ETT inserted through vocal cords under direct vision,  positive ETCO2,  CO2 detector and breath sounds checked- equal and bilateral Secured at: 23 cm Tube secured with: Tape Dental Injury: Teeth and Oropharynx as per pre-operative assessment

## 2016-05-18 NOTE — Progress Notes (Signed)
PROGRESS NOTE    Manuel Lewis  [id number] DOB: 22-Mar-1921 DOA: 05/16/2016 PCP: [name], MD  Outpatient Specialists:     Brief Narrative:   83 year old male Atrial fibrillation [place] score 3 Single chamber Medtronic pacemaker placed 07/2009-followed by Dr. Caryl Comes Hypertension Prostate cancer with seed implant patient 1998 History of urethral stricture dilatation 02/2016 [facility]-urologist is [name] Macular degeneration Status post left inguinal hernia repair 08/2011  Admitting 05/16/2016 secondary to moderate to severe abdominal pain and 10 PM 05/15/16 no relation to food. Some nausea. No vomiting. No dark stool. No pars 2. No fever. No chills. No melena. No rash. No falls. Central epigastric radiating to the back, 8/10-relieved by morphine. Did not try any medication at home. Became more intolerable at 0 400 05/16/2016 and decided to come to the emergency room  In the emergency room received morphine 4 mg kept nothing by mouth and general surgery consulted  Bun /creat 21/0.94 AST/ALT-265/159 no WBC Abd [place] multiples   Assessment & Plan:   Active Problems:   Acute cholecystitis   Complication of anesthesia    1. Cholecystitis vs passed CBD stone-bowel rest, start zosyn, LR 100 cc/h. Cmet 8 AM to trend LFTs. General surgery -did on 3/4 post-op per Gen surg--clear liquid diet  2. ?CBD tumour vs stricture-for CT abd 3/5 per gen surg--will involve GI subsequently--LFT's have trended downwards very well 3. Atrial fibrillation [place] score 4, bradycardia status post pacemaker 2011-very stable  not requiring any antiarrhythmic.  monitor on telemetry.  INR level-- after FFP 3/2-->1.9.  Heparin per pharmacy ordered until decision about procedures 4. Prostate Ca s/p Radiation seeds 1998-follow with Urology at [facility] yearly 5. Obstructive uropathy s/p urethral dialtation-currently on an 6. Stable hypertension-hold HCTZ and losartan give IV fluids continue  amlodipine 10 daily 7. Prior macular degeneration continue Ocuvite vitamins   DVT prophylaxis: full dose heparin  Code Status: Full Family Communication:  Disposition Plan:    Consultants:   gen surg  GI  Procedures:     Antimicrobials:   zosyn    Subjective:  Seen on PACU No new issues tol surgery Dr. Lucia Gaskins has spoekn with patient's son  Objective: Vitals:   05/18/16 1319 05/18/16 1330 05/18/16 1337 05/18/16 1345  BP:  130/67  128/67  Pulse: 61 (!) 58 62 60  Resp: 15 13 14 10   Temp:    98.5 F (36.9 C)  TempSrc:      SpO2: 99% 100% 100% 100%  Weight:      Height:        Intake/Output Summary (Last 24 hours) at 05/18/16 1350 Last data filed at 05/18/16 1346  Gross per 24 hour  Intake          3450.32 ml  Output             1525 ml  Net          1925.32 ml   Filed Weights   05/16/16 0932 05/16/16 1817  Weight: 68 kg (150 lb) 67 kg (147 lb 11.3 oz)    Examination:  General exam: Cane around in the halls comfortable  Respiratory system: Clear to auscultation. Respiratory effort normal. Cardiovascular system: S1 & S2 heard, RRR.  Gastrointestinal system: Abdomen is nondistended,wound not examined Central nervous system: Alert and oriented. No focal neurological deficits. Extremities: Symmetric 5 x 5 power. Skin: No rashes, lesions or ulcers Psychiatry: Judgement and insight appear normal. Mood & affect appropriate.     Data Reviewed:  I have personally reviewed following labs and imaging studies  CBC:  Recent Labs Lab 05/16/16 0941 05/17/16 0542 05/18/16 0604  WBC 7.5 4.5 7.2  HGB 12.4* 10.4* 11.8*  HCT 36.0* 30.2* 35.5*  MCV 92.5 93.5 94.7  PLT 219 181 [id number]   Basic Metabolic Panel:  Recent Labs Lab 05/16/16 0941 05/17/16 0542 05/18/16 0604  NA 136 139 138  K 3.5 3.1* 3.7  CL 103 107 105  CO2 25 27 24   GLUCOSE 130* 96 99  BUN 21* 15 14  CREATININE 0.94 0.82 0.79  CALCIUM 10.4* 9.3 9.9   GFR: Estimated Creatinine  Clearance: 55.8 mL/min (by C-G formula based on SCr of 0.79 mg/dL). Liver Function Tests:  Recent Labs Lab 05/16/16 0941 05/17/16 0542 05/18/16 0604  AST 265* 105* 69*  ALT 159* 116* 99*  ALKPHOS 136* 101 102  BILITOT 2.0* 1.1 1.6*  PROT 7.7 6.3* 7.5  ALBUMIN 4.5 3.9 4.5    Recent Labs Lab 05/16/16 0941  LIPASE 50   No results for input(s): AMMONIA in the last 168 hours. Coagulation Profile:  Recent Labs Lab 05/16/16 0941 05/17/16 0542 05/18/16 0604  INR 2.22 1.95 1.28   Cardiac Enzymes: No results for input(s): CKTOTAL, CKMB, CKMBINDEX, TROPONINI in the last 168 hours. BNP (last 3 results) No results for input(s): PROBNP in the last 8760 hours. HbA1C: No results for input(s): HGBA1C in the last 72 hours. CBG: No results for input(s): GLUCAP in the last 168 hours. Lipid Profile: No results for input(s): CHOL, HDL, LDLCALC, TRIG, CHOLHDL, LDLDIRECT in the last 72 hours. Thyroid Function Tests: No results for input(s): TSH, T4TOTAL, FREET4, T3FREE, THYROIDAB in the last 72 hours. Anemia Panel: No results for input(s): VITAMINB12, FOLATE, FERRITIN, TIBC, IRON, RETICCTPCT in the last 72 hours. Urine analysis:    Component Value Date/Time   COLORURINE YELLOW 05/16/2016 1051   APPEARANCEUR CLEAR 05/16/2016 1051   LABSPEC 1.011 05/16/2016 1051   PHURINE 7.0 05/16/2016 1051   GLUCOSEU NEGATIVE 05/16/2016 1051   HGBUR NEGATIVE 05/16/2016 1051   BILIRUBINUR NEGATIVE 05/16/2016 1051   KETONESUR 5 (A) 05/16/2016 1051   PROTEINUR 100 (A) 05/16/2016 1051   NITRITE NEGATIVE 05/16/2016 1051   LEUKOCYTESUR NEGATIVE 05/16/2016 1051   Sepsis Labs: @LABRCNTIP (procalcitonin:4,lacticidven:4)  ) Recent Results (from the past 240 hour(s))  Surgical pcr screen     Status: None   Collection Time: 05/18/16  5:49 AM  Result Value Ref Range Status   MRSA, PCR NEGATIVE NEGATIVE Final   Staphylococcus aureus NEGATIVE NEGATIVE Final    Comment:        The Xpert SA Assay  (FDA approved for NASAL specimens in patients over 63 years of age), is one component of a comprehensive surveillance program.  Test performance has been validated by [facility] for patients greater than or equal to 11 year old. It is not intended to diagnose infection nor to guide or monitor treatment.          Radiology Studies: Dg Cholangiogram Operative  Result Date: 05/18/2016 CLINICAL DATA:  Cholecystitis.  Status post cholecystectomy. EXAM: INTRAOPERATIVE CHOLANGIOGRAM TECHNIQUE: Cholangiographic images from the C-arm fluoroscopic device were submitted for interpretation post-operatively. Please see the procedural report for the amount of contrast and the fluoroscopy time utilized. COMPARISON:  Ultrasound 05/16/2016 FINDINGS: Four runs are submitted for interpretation, demonstrating opacification of a dilated common bile duct and intrahepatic radicles. There is apparent tapering of the common bile duct and failure of the midportion of the duct to opacified,  raising question of mid common bile duct stricture. The distal portion of the duct fills faintly with contrast and also appears dilated. Contrast is identified within the duodenum. IMPRESSION: Possible stricture of the mid common bile duct. Further evaluation with MRCP is recommended. The study is reviewed with Dr. Lucia Gaskins. Electronically Signed   By: Nolon Nations M.D.   On: 05/18/2016 13:13        Scheduled Meds: . [MAR Hold] amLODipine  10 mg Oral Q breakfast  . fentaNYL      . [MAR Hold] potassium chloride SA  40 mEq Oral BID   Continuous Infusions: . 0.9 % NaCl with KCl 20 mEq / L 75 mL/hr at 05/17/16 2119     LOS: 2 days    Time spent: [place], MD [facility]   If 7PM-7AM, please contact night-coverage [email or web address] Password [email or web address] 05/18/2016, 1:50 PM  \

## 2016-05-18 NOTE — Transfer of Care (Signed)
Immediate Anesthesia Transfer of Care Note  Patient: Manuel Lewis  Procedure(s) Performed: Procedure(s): LAPAROSCOPIC CHOLECYSTECTOMY WITH INTRAOPERATIVE CHOLANGIOGRAM (N/A)  Patient Location: PACU  Anesthesia Type:General  Level of Consciousness: awake, alert  and oriented  Airway & Oxygen Therapy: Patient Spontanous Breathing and Patient connected to face mask oxygen  Post-op Assessment: Report given to RN and Post -op Vital signs reviewed and stable  Post vital signs: Reviewed and stable  Last Vitals:  Vitals:   05/17/16 2110 05/18/16 0543  BP: 127/77 139/86  Pulse: 81 76  Resp: 17 18  Temp: 36.5 C 36.8 C    Last Pain:  Vitals:   05/18/16 0744  TempSrc:   PainSc: 0-No pain         Complications: No apparent anesthesia complications

## 2016-05-18 NOTE — Progress Notes (Signed)
[place] [facility] Office:  [phone] General Surgery Progress Note   LOS: 2 days  POD -     Assessment/Plan: 1.  Cholecystitis, cholelithiasis  LFT's continue to improve  Coags look good  To OR today  I discussed with the patient the indications and risks of gall bladder surgery.  The primary risks of gall bladder surgery include, but are not limited to, bleeding, infection, common bile duct injury, and open surgery.  There is also the risk that the patient may have continued symptoms after surgery.  We discussed the typical post-operative recovery course. I tried to answer the patient's questions.  2. A. Fib  Has pacemaker  Appreciate Dr. Julious Payer input 3.  Anticoagulated  Was on coumadin  PT/INR - 16.1/1.28 - 05/18/2016  Heparin stopped last PM 4.  HTN 5.  History of prostate cancer  Seed implant - 1998  6.  History of urethral stricture dilatation 02/2016 [facility]  urologist is [name] 7.   DVT prophylaxis - coming off coumadin 8.  HOH 9.  Hypokalemia - corrected  K+ - 3.7 - 05/18/2016   Active Problems:   Acute cholecystitis   Complication of anesthesia  Subjective:  Doing well.  No more symptoms yesterday.  I discussed surgery again and will proceed this AM.  Objective:   Vitals:   05/17/16 2110 05/18/16 0543  BP: 127/77 139/86  Pulse: 81 76  Resp: 17 18  Temp: 97.7 F (36.5 C) 98.2 F (36.8 C)     Intake/Output from previous day:  03/03 0701 - 03/04 0700 In: 2730.3 [P.O.:720; I.V.:2010.3] Out: 1600 [Urine:1600]  Intake/Output this shift:  No intake/output data recorded.   Physical Exam:   General: Older WM who is alert and oriented.    HEENT: Normal. Pupils equal. .   Lungs: Clear   Abdomen: Soft   Lab Results:     Recent Labs  05/17/16 0542 05/18/16 0604  WBC 4.5 7.2  HGB 10.4* 11.8*  HCT 30.2* 35.5*  PLT 181 223    BMET    Recent Labs  05/17/16 0542 05/18/16 0604  NA 139 138  K 3.1* 3.7  CL 107 105   CO2 27 24  GLUCOSE 96 99  BUN 15 14  CREATININE 0.82 0.79  CALCIUM 9.3 9.9    PT/INR    Recent Labs  05/17/16 0542 05/18/16 0604  LABPROT 22.5* 16.1*  INR 1.95 1.28    ABG  No results for input(s): PHART, HCO3 in the last 72 hours.  Invalid input(s): PCO2, PO2   Studies/Results:  Dg Chest 2 View  Result Date: 05/16/2016 CLINICAL DATA:  Upper abdominal pain EXAM: CHEST  2 VIEW COMPARISON:  08/14/2011 FINDINGS: Single lead pacer lead in the right ventricle unchanged. Heart size upper normal. Negative for heart failure. Lungs are clear without infiltrate effusion or mass. IMPRESSION: No active cardiopulmonary disease. Electronically Signed   By: Franchot Gallo M.D.   On: 05/16/2016 10:00   US Abdomen Limited  Result Date: 05/16/2016 CLINICAL DATA:  Upper abdominal pain EXAM: US ABDOMEN LIMITED - RIGHT UPPER QUADRANT COMPARISON:  None. FINDINGS: Gallbladder: Within the gallbladder, there are multiple echogenic foci which move and shadow consistent with gallstones. Largest individual gallstone measures 2.2 cm in length. Gallbladder wall is thickened and edematous. No pericholecystic fluid. No sonographic Murphy sign noted by sonographer. Common bile duct: Diameter: 3 mm proximally. Most of the common bile duct is obscured by gas. Liver: No focal lesion identified. Within  normal limits in parenchymal echogenicity. IMPRESSION: Gallstones with gallbladder wall thickening. The appearance is concerning for acute cholecystitis. Only a small portion of the common bile duct is visualized; the small portion appears normal. No evident liver lesions. Electronically Signed   By: Lowella Grip III M.D.   On: 05/16/2016 12:43     Anti-infectives:   Anti-infectives    None      Alphonsa Overall, MD, FACS Pager: [phone] Surgery Office: [phone] 05/18/2016

## 2016-05-18 NOTE — Anesthesia Preprocedure Evaluation (Addendum)
Anesthesia Evaluation  Patient identified by MRN, date of birth, ID band Patient awake    Reviewed: Allergy & Precautions, NPO status , Patient's Chart, lab work & pertinent test results  History of Anesthesia Complications Negative for: history of anesthetic complications  Airway Mallampati: II  TM Distance: >3 FB Neck ROM: Full    Dental  (+) Teeth Intact, Dental Advisory Given   Pulmonary neg pulmonary ROS,    Pulmonary exam normal        Cardiovascular hypertension, Normal cardiovascular exam+ dysrhythmias Atrial Fibrillation + pacemaker      Neuro/Psych negative neurological ROS  negative psych ROS   GI/Hepatic Neg liver ROS,   Endo/Other  negative endocrine ROS  Renal/GU negative Renal ROS     Musculoskeletal   Abdominal   Peds  Hematology negative hematology ROS (+)   Anesthesia Other Findings   Reproductive/Obstetrics                            Anesthesia Physical Anesthesia Plan  ASA: III  Anesthesia Plan: General   Post-op Pain Management:    Induction: Intravenous  Airway Management Planned: Oral ETT  Additional Equipment:   Intra-op Plan:   Post-operative Plan: Extubation in OR  Informed Consent: I have reviewed the patients History and Physical, chart, labs and discussed the procedure including the risks, benefits and alternatives for the proposed anesthesia with the patient or authorized representative who has indicated his/her understanding and acceptance.   Dental advisory given  Plan Discussed with: CRNA, Anesthesiologist and Surgeon  Anesthesia Plan Comments:        Anesthesia Quick Evaluation

## 2016-05-19 ENCOUNTER — Inpatient Hospital Stay (HOSPITAL_COMMUNITY): Payer: Medicare Other

## 2016-05-19 ENCOUNTER — Encounter (HOSPITAL_COMMUNITY): Payer: Self-pay | Admitting: Radiology

## 2016-05-19 DIAGNOSIS — R7989 Other specified abnormal findings of blood chemistry: Secondary | ICD-10-CM

## 2016-05-19 DIAGNOSIS — R945 Abnormal results of liver function studies: Secondary | ICD-10-CM

## 2016-05-19 DIAGNOSIS — K8020 Calculus of gallbladder without cholecystitis without obstruction: Secondary | ICD-10-CM

## 2016-05-19 LAB — COMPREHENSIVE METABOLIC PANEL
ALBUMIN: 4.1 g/dL (ref 3.5–5.0)
ALK PHOS: 164 U/L — AB (ref 38–126)
ALT: 262 U/L — AB (ref 17–63)
AST: 268 U/L — ABNORMAL HIGH (ref 15–41)
Anion gap: 7 (ref 5–15)
BUN: 12 mg/dL (ref 6–20)
CALCIUM: 9.6 mg/dL (ref 8.9–10.3)
CO2: 24 mmol/L (ref 22–32)
CREATININE: 0.89 mg/dL (ref 0.61–1.24)
Chloride: 106 mmol/L (ref 101–111)
GFR calc Af Amer: >60 mL/min (ref >60)
GFR calc non Af Amer: >60 mL/min (ref >60)
GLUCOSE: 103 mg/dL — AB (ref 65–99)
Potassium: 4.1 mmol/L (ref 3.5–5.1)
SODIUM: 137 mmol/L (ref 135–145)
Total Bilirubin: 2.2 mg/dL — ABNORMAL HIGH (ref 0.3–1.2)
Total Protein: 6.7 g/dL (ref 6.5–8.1)

## 2016-05-19 LAB — PREPARE FRESH FROZEN PLASMA
UNIT DIVISION: 0
Unit division: 0

## 2016-05-19 LAB — BPAM FFP
BLOOD PRODUCT EXPIRATION DATE: 201803072359
Blood Product Expiration Date: 201803072359
ISSUE DATE / TIME: 201803022308
ISSUE DATE / TIME: 201803030032
UNIT TYPE AND RH: 5100
UNIT TYPE AND RH: 5100

## 2016-05-19 MED ORDER — IOPAMIDOL (ISOVUE-300) INJECTION 61%
INTRAVENOUS | Status: AC
  Administered 2016-05-19: 09:00:00
  Administered 2016-05-19: 15 mL
  Filled 2016-05-19: qty 30

## 2016-05-19 MED ORDER — IOPAMIDOL (ISOVUE-300) INJECTION 61%
100.0000 mL | Freq: Once | INTRAVENOUS | Status: AC | PRN
  Administered 2016-05-19: 100 mL via INTRAVENOUS

## 2016-05-19 MED ORDER — IOPAMIDOL (ISOVUE-300) INJECTION 61%
INTRAVENOUS | Status: AC
  Filled 2016-05-19: qty 100

## 2016-05-19 MED ORDER — IOPAMIDOL (ISOVUE-300) INJECTION 61%: 15.0000 mL | Freq: Once | INTRAVENOUS | Status: DC | PRN

## 2016-05-19 MED ORDER — SODIUM CHLORIDE 0.9 % IV SOLN
1.5000 g | Freq: Once | INTRAVENOUS | Status: AC
Start: 2016-05-21 — End: 2016-05-21
  Administered 2016-05-21: 1.5 g via INTRAVENOUS
  Filled 2016-05-19: qty 1.5

## 2016-05-19 NOTE — Progress Notes (Signed)
PROGRESS NOTE    Manuel Lewis  [id number] DOB: 1922-03-16 DOA: 05/16/2016 PCP: [name], MD  Outpatient Specialists:     Brief Narrative:   83 year old male Atrial fibrillation [place] score 3 Single chamber Medtronic pacemaker placed 07/2009-followed by Dr. Caryl Comes Hypertension Prostate cancer with seed implant patient 1998 History of urethral stricture dilatation 02/2016 [place]-urologist is [name] Macular degeneration Status post left inguinal hernia repair 08/2011  Admitting 05/16/2016 secondary to moderate to severe abdominal pain and 10 PM 05/15/16 no relation to food. Some nausea. No vomiting. No dark stool. No pars 2. No fever. No chills. No melena. No rash. No falls. Central epigastric radiating to the back, 8/10-relieved by morphine. Did not try any medication at home. Became more intolerable at 0 400 05/16/2016 and decided to come to the emergency room  In the emergency room received morphine 4 mg kept nothing by mouth and general surgery consulted  Bun /creat 21/0.94 AST/ALT-265/159 no WBC Abd [place] multiples   Assessment & Plan:   Active Problems:   Acute cholecystitis   Complication of anesthesia    Cholecystitis vs passed CBD stone-bowel rest, start zosyn, LR 100 cc/h. Cmet  AM show ? trend LFTs. General surgery -did la[p Chole on 3/4 post-op per Gen surg--clear liquid diet  1. ?CBD tumour vs stricture-for CT abd 3/5 neg for mass-defer ERCP/MRCP per GI--might have just been dilation 2/2 pased CBD stone--if not planning for Procedure, ? D/c soon 2. Atrial fibrillation [place] score 4, bradycardia status post pacemaker 2011-very stable  not requiring any antiarrhythmic.  monitor on telemetry.  INR level-- after FFP 3/2-->1.9.  Heparin per pharmacy ordered until decision about procedures 3. Prostate Ca s/p Radiation seeds 1998-follow with Urology at [facility] yearly 4. Obstructive uropathy s/p urethral dialtation-currently stable 5. Stable  hypertension-hold HCTZ and losartan give IV fluids continue amlodipine 10 daily 6. Prior macular degeneration continue Ocuvite vitamins   DVT prophylaxis: full dose heparin  Code Status: Full Family Communication:  Disposition Plan:    Consultants:   gen surg  GI  Procedures:     Antimicrobials:   zosyn    Subjective:  No pain No n/v/cp Eating clears  Objective: Vitals:   05/18/16 2202 05/19/16 0539 05/19/16 1010 05/19/16 1402  BP: 135/76 125/76 (!) 144/70 134/65  Pulse: 64 75 65 60  Resp: 18 16  18   Temp: 98.5 F (36.9 C) 98.8 F (37.1 C)  98.7 F (37.1 C)  TempSrc: Oral Oral  Oral  SpO2: 96% 95%  98%  Weight:      Height:        Intake/Output Summary (Last 24 hours) at 05/19/16 1422 Last data filed at 05/19/16 1401  Gross per 24 hour  Intake             2150 ml  Output              500 ml  Net             1650 ml   Filed Weights   05/16/16 0932 05/16/16 1817  Weight: 68 kg (150 lb) 67 kg (147 lb 11.3 oz)    Examination:  General exam: moving around in the halls comfortably Respiratory system: Clear to auscultation. Respiratory effort normal. Cardiovascular system: S1 & S2 heard, RRR.  Gastrointestinal system: Abdomen is nondistended,wounds well aligned  Central nervous system: Alert and oriented. No focal neurological deficits. Extremities: Symmetric 5 x 5 power. Skin: No rashes, lesions or ulcers Psychiatry:  Judgement and insight appear normal. Mood & affect appropriate.     Data Reviewed: I have personally reviewed following labs and imaging studies  CBC:  Recent Labs Lab 05/16/16 0941 05/17/16 0542 05/18/16 0604  WBC 7.5 4.5 7.2  HGB 12.4* 10.4* 11.8*  HCT 36.0* 30.2* 35.5*  MCV 92.5 93.5 94.7  PLT 219 181 [id number]   Basic Metabolic Panel:  Recent Labs Lab 05/16/16 0941 05/17/16 0542 05/18/16 0604 05/19/16 0609  NA 136 139 138 137  K 3.5 3.1* 3.7 4.1  CL 103 107 105 106  CO2 25 27 24 24   GLUCOSE 130* 96 99 103*  BUN  21* 15 14 12   CREATININE 0.94 0.82 0.79 0.89  CALCIUM 10.4* 9.3 9.9 9.6   GFR: Estimated Creatinine Clearance: 50.2 mL/min (by C-G formula based on SCr of 0.89 mg/dL). Liver Function Tests:  Recent Labs Lab 05/16/16 0941 05/17/16 0542 05/18/16 0604 05/19/16 0609  AST 265* 105* 69* 268*  ALT 159* 116* 99* 262*  ALKPHOS 136* 101 102 164*  BILITOT 2.0* 1.1 1.6* 2.2*  PROT 7.7 6.3* 7.5 6.7  ALBUMIN 4.5 3.9 4.5 4.1    Recent Labs Lab 05/16/16 0941  LIPASE 50   No results for input(s): AMMONIA in the last 168 hours. Coagulation Profile:  Recent Labs Lab 05/16/16 0941 05/17/16 0542 05/18/16 0604  INR 2.22 1.95 1.28   Cardiac Enzymes: No results for input(s): CKTOTAL, CKMB, CKMBINDEX, TROPONINI in the last 168 hours. BNP (last 3 results) No results for input(s): PROBNP in the last 8760 hours. HbA1C: No results for input(s): HGBA1C in the last 72 hours. CBG: No results for input(s): GLUCAP in the last 168 hours. Lipid Profile: No results for input(s): CHOL, HDL, LDLCALC, TRIG, CHOLHDL, LDLDIRECT in the last 72 hours. Thyroid Function Tests: No results for input(s): TSH, T4TOTAL, FREET4, T3FREE, THYROIDAB in the last 72 hours. Anemia Panel: No results for input(s): VITAMINB12, FOLATE, FERRITIN, TIBC, IRON, RETICCTPCT in the last 72 hours. Urine analysis:    Component Value Date/Time   COLORURINE YELLOW 05/16/2016 1051   APPEARANCEUR CLEAR 05/16/2016 1051   LABSPEC 1.011 05/16/2016 1051   PHURINE 7.0 05/16/2016 1051   GLUCOSEU NEGATIVE 05/16/2016 1051   HGBUR NEGATIVE 05/16/2016 1051   BILIRUBINUR NEGATIVE 05/16/2016 1051   KETONESUR 5 (A) 05/16/2016 1051   PROTEINUR 100 (A) 05/16/2016 1051   NITRITE NEGATIVE 05/16/2016 1051   LEUKOCYTESUR NEGATIVE 05/16/2016 1051   Sepsis Labs: @LABRCNTIP (procalcitonin:4,lacticidven:4)  ) Recent Results (from the past 240 hour(s))  Surgical pcr screen     Status: None   Collection Time: 05/18/16  5:49 AM  Result Value  Ref Range Status   MRSA, PCR NEGATIVE NEGATIVE Final   Staphylococcus aureus NEGATIVE NEGATIVE Final    Comment:        The Xpert SA Assay (FDA approved for NASAL specimens in patients over 46 years of age), is one component of a comprehensive surveillance program.  Test performance has been validated by [facility] for patients greater than or equal to 59 year old. It is not intended to diagnose infection nor to guide or monitor treatment.          Radiology Studies: Ct Abdomen Pelvis W Wo Contrast  Result Date: 05/19/2016 CLINICAL DATA:  Status post laparoscopic cholecystectomy, CBD obstruction on intraoperative cholangiogram, possible CBD/pancreatic mass EXAM: CT ABDOMEN AND PELVIS WITHOUT AND WITH CONTRAST TECHNIQUE: Multidetector CT imaging of the abdomen and pelvis was performed following the standard protocol before and following the bolus  administration of intravenous contrast. CONTRAST:  142mL ISOVUE-300 IOPAMIDOL (ISOVUE-300) INJECTION 61% COMPARISON:  Intraoperative cholangiogram dated 05/18/2016. Right upper quadrant ultrasound dated 05/16/2016. FINDINGS: Lower chest: Mild patchy left lower lobe opacity (series 3/image 1), possibly reflecting aspiration or pneumonia. Small bilateral pleural effusions, right greater than left. Associated mild left basilar atelectasis. Hepatobiliary: Liver is grossly unremarkable. Calcified granuloma in segment 8 (series 4/ image 23). Status post cholecystectomy. Mild fluid in the gallbladder fossa (series 13/image 44), postsurgical. No intrahepatic duct dilatation. Dilated common duct, measuring up to 14 mm at its midportion (series 13/ image 44), smoothly tapering at the ampulla. No obstructing mass is seen. Pancreas: Within normal limits. No mass along the pancreatic head. No dilated pancreatic duct or atrophy along the pancreatic body/tail. Spleen: Within normal limits. Adrenals/Urinary Tract: Adrenal glands are within normal limits. Small right  renal cysts measuring up to 1.1 cm (series 7/image 62). 7 mm left lower pole renal cyst (series 7/ image 62). No hydronephrosis. Bladder is mildly thick-walled/trabeculated, suggesting chronic bladder outlet obstruction. Nodular thickening along the left posterior bladder wall is favored to reflect enlargement of the central gland of the prostate rather than an irregular bladder wall. Stomach/Bowel: Stomach is within normal limits. No evidence of bowel obstruction. Prior appendectomy. Left colon is decompressed. Vascular/Lymphatic: No evidence of abdominal aortic aneurysm. Atherosclerotic calcifications of the abdominal aorta and branch vessels. No suspicious abdominopelvic lymphadenopathy. Reproductive: Brachytherapy seeds in the prostate. Enlargement/ nodularity of the central gland, which indents the left posterior bladder (series 7/image 114). Tumor is not excluded. Other: No abdominopelvic ascites. Scattered foci of free air, postsurgical. Additional postsurgical changes/gas along the right anterior abdominal wall. Moderate fat containing right inguinal hernia with fluid/stranding. Musculoskeletal: Degenerative changes of the visualized thoracolumbar spine. IMPRESSION: Status post cholecystectomy with associated postsurgical changes. Dilated common duct, measuring 14 mm at its midportion. No obstructing mass is evident on CT. Mild patchy left lower lobe opacity, possibly reflecting aspiration or pneumonia. Small bilateral pleural effusions with associated mild bibasilar atelectasis. Brachytherapy seeds in the prostate. Suspected BPH indenting the left posterior base of the bladder, tumor not excluded. Additional ancillary findings as above. Electronically Signed   By: Julian Hy M.D.   On: 05/19/2016 10:15   Dg Cholangiogram Operative  Result Date: 05/18/2016 CLINICAL DATA:  Cholecystitis.  Status post cholecystectomy. EXAM: INTRAOPERATIVE CHOLANGIOGRAM TECHNIQUE: Cholangiographic images from the  C-arm fluoroscopic device were submitted for interpretation post-operatively. Please see the procedural report for the amount of contrast and the fluoroscopy time utilized. COMPARISON:  Ultrasound 05/16/2016 FINDINGS: Four runs are submitted for interpretation, demonstrating opacification of a dilated common bile duct and intrahepatic radicles. There is apparent tapering of the common bile duct and failure of the midportion of the duct to opacified, raising question of mid common bile duct stricture. The distal portion of the duct fills faintly with contrast and also appears dilated. Contrast is identified within the duodenum. IMPRESSION: Possible stricture of the mid common bile duct. Further evaluation with MRCP is recommended. The study is reviewed with Dr. Lucia Gaskins. Electronically Signed   By: Nolon Nations M.D.   On: 05/18/2016 13:13        Scheduled Meds: . amLODipine  10 mg Oral Q breakfast  .  ceFAZolin (ANCEF) IV  1 g Intravenous Q8H  . iopamidol      . potassium chloride SA  40 mEq Oral Daily   Continuous Infusions: . 0.9 % NaCl with KCl 20 mEq / L 75 mL/hr at 05/18/16 2020  LOS: 3 days    Time spent: [place], MD [facility]   If 7PM-7AM, please contact night-coverage [email or web address] Password [email or web address] 05/19/2016, 2:22 PM  \

## 2016-05-19 NOTE — Consult Note (Signed)
Consultation  Referring Provider:  Dr Verlon Au [facility] Primary Care Physician:  [name], MD Primary Gastroenterologist:  none  Reason for Consultation:   Abnormal IOC/ CT  HPI: Manuel Lewis is a 83 y.o. male  who was admitted through the emergency room on 05/16/2016 after onset of acute epigastric pain. He was noted to have mildly elevated LFTs. Upper abdominal ultrasound showed multiple gallstones and gallbladder wall thickening and edema. CBD 3 mm proximally but most of the distal bile duct was obscured by gas. Patient underwent laparoscopic cholecystectomy yesterday per Dr. Lucia Gaskins with IOC and bile duct exploration with [place]. He was noted to have a tapered area in the mid common bile duct, glucagon was given and then some stone debris was able to be removed from the cystic duct. He had repeat cholangiogram done which showed contrast on into the duodenum with some distal common bile duct narrowing.  Patient unable to have an MRCP due to pacemaker and therefore had CT of the abdomen and pelvis done today which shows the liver to be grossly normal, there is no intrahepatic duct dilation. He does have a dilated common bile duct to 14 mm at the midportion and smoothly tapering at the ampulla. There is no evidence of obstructing mass in pancreas appears normal. It was noted he has some left lower lobe opacity question pneumonia LFTs today with total bili of 2.2/AST 268/ALT 262/alkaline phosphatase 164. 2 days ago bili was normal and AST/ALT in the 100 range. 3 days ago T bili was 2 and LFTs again mildly elevated.  Patient has history of atrial fibrillation, generally maintained on Coumadin which has been reversed. Also status post pacemaker, history of hypertension and prostate cancer.   Pt feels fine today, a little sore post op - hungry.   Past Medical History:  Diagnosis Date  . Chronic a-fib ([place])    since early 1990's with profound bradycardia and pauses;  s/p  implantation  of Medtronic Adapta [id number], serial # [id number]  . Complication of anesthesia 1998   bradycardia  . Hypertension 1990  . Inguinal hernia   . Macular degeneration   . Nocturia   . Pacemaker   . Prostate cancer ([place]) 1998   with seed implantation    Past Surgical History:  Procedure Laterality Date  . APPENDECTOMY  1941  . CARDIOVERSION     IN THE 1990's  . CATARACT EXTRACTION     2000 and 2007/ h/o macular degeneration since 2009  . CHOLECYSTECTOMY N/A 05/18/2016   Procedure: LAPAROSCOPIC CHOLECYSTECTOMY WITH INTRAOPERATIVE CHOLANGIOGRAM;  Surgeon: Alphonsa Overall, MD;  Location: [facility];  Service: General;  Laterality: N/A;  . [place]  . HERNIA REPAIR  08/18/11   inguinal  . INGUINAL HERNIA REPAIR  08/18/2011   Procedure: HERNIA REPAIR INGUINAL ADULT;  Surgeon: [name], MD;  Location: [place];  Service: General;  Laterality: Left;  Recurrent Left inguinal hernia repair  . INSERT / REPLACE / REMOVE PACEMAKER  08/14/09   IMPLANTATION OF SINGLE CHAMBER PULSE  GENERATING SYSTEM;   MODEL ADSR01, SERIAL # [id number] H ADAPTA  . THYROIDECTOMY, PARTIAL  1960   benign nodule  . TONSILLECTOMY    . US ECHOCARDIOGRAPHY  07/26/2009   EF 55-60%    Prior to Admission medications   Medication Sig Start Date End Date Taking? Authorizing Provider  amLODipine (NORVASC) 10 MG tablet Take 10 mg by mouth daily with breakfast.  Yes Historical Provider, MD  beta carotene w/minerals (OCUVITE) tablet Take 1 tablet by mouth daily with breakfast.   Yes Historical Provider, MD  calcium-vitamin D (OSCAL WITH D) 500-200 MG-UNIT tablet Take 1 tablet by mouth at bedtime.   Yes Historical Provider, MD  hydrochlorothiazide (,MICROZIDE/HYDRODIURIL,) 12.5 MG capsule Take 12.5 mg by mouth daily with breakfast.    Yes Historical Provider, MD  losartan (COZAAR) 100 MG tablet Take 100 mg by mouth daily with breakfast.    Yes Historical Provider, MD  warfarin (COUMADIN) 2 MG  tablet Take 1-2 mg by mouth every evening. Takes 1 mg everyday except on 2 mg on Monday's   Yes Historical Provider, MD    Current Facility-Administered Medications  Medication Dose Route Frequency Provider Last Rate Last Dose  . 0.9 % NaCl with KCl 20 mEq/ L  infusion   Intravenous Continuous Excell Seltzer, MD 75 mL/hr at 05/18/16 2020    . amLODipine (NORVASC) tablet 10 mg  10 mg Oral Q breakfast Nita Sells, MD   10 mg at 05/19/16 1011  . ceFAZolin (ANCEF) IVPB 1 g/50 mL premix  1 g Intravenous Q8H Alphonsa Overall, MD   1 g at 05/19/16 0645  . HYDROcodone-acetaminophen (NORCO/VICODIN) 5-325 MG per tablet 1-2 tablet  1-2 tablet Oral Q6H PRN Alphonsa Overall, MD      . iopamidol (ISOVUE-300) 61 % injection 15 mL  15 mL Oral Once PRN Nita Sells, MD      . iopamidol (ISOVUE-300) 61 % injection           . morphine 4 MG/ML injection 1-3 mg  1-3 mg Intravenous Q2H PRN Nita Sells, MD      . ondansetron (ZOFRAN) injection 4 mg  4 mg Intravenous Q6H PRN Alphonsa Overall, MD      . potassium chloride SA (K-DUR,KLOR-CON) CR tablet 40 mEq  40 mEq Oral Daily Alphonsa Overall, MD   40 mEq at 05/19/16 1011  . traMADol (ULTRAM) tablet 50 mg  50 mg Oral Q6H PRN Nita Sells, MD   50 mg at 05/18/16 1641    Allergies as of 05/16/2016  . (No Known Allergies)    Family History  Problem Relation Age of Onset  . Stroke Mother   . Kidney failure Father     Social History   Social History  . Marital status: Married    Spouse name: N/A  . Number of children: 1  . Years of education: N/A   Occupational History  . RETIRED [employer]    ENGINEER   Social History Main Topics  . Smoking status: Never Smoker  . Smokeless tobacco: Never Used  . Alcohol use 4.2 oz/week    7 Glasses of wine per week     Comment: 1 GLASS OF WINE NIGHTLY  . Drug use: No  . Sexual activity: Not on file   Other Topics Concern  . Not on file   Social History Narrative   MARRIED   SON IS DR. DAVID  [place]   RETIRED [employer] ENGINEER   NEVER SMOKED   1 GLASS OF WINE NIGHTLY   HE HAD ALSO BEEN A PILOT AND FLIGHT INSTRUCTOR    Review of Systems: Pertinent positive and negative review of systems were noted in the above HPI section.  All other review of systems was otherwise negative.  Physical Exam: Vital signs in last 24 hours: Temp:  [98.5 F (36.9 C)-99 F (37.2 C)] 98.8 F (37.1 C) (03/05 0539) Pulse Rate:  [  59-75] 65 (03/05 1010) Resp:  [10-18] 16 (03/05 0539) BP: (125-144)/(67-76) 144/70 (03/05 1010) SpO2:  [95 %-100 %] 95 % (03/05 0539) Last BM Date: 05/18/16 General:   Alert,  Well-developed, well-nourished, elderly WM pleasant and cooperative in NAD Head:  Normocephalic and atraumatic. Eyes:  Sclera clear, no icterus.   Conjunctiva pink. Ears:  Normal auditory acuity. Nose:  No deformity, discharge,  or lesions. Mouth:  No deformity or lesions.   Neck:  Supple; no masses or thyromegaly. Lungs:  Clear throughout to auscultation.   No wheezes, crackles, or rhonchi. Heart:  Regular rate and rhythm; no murmurs, clicks, rubs,  or gallops. Pacer left chest Abdomen:  Soft,nontender, BS active,nonpalp mass or hsm.   Rectal:  Deferred  Msk:  Symmetrical without gross deformities. . Pulses:  Normal pulses noted. Extremities:  Without clubbing or edema. Neurologic:  Alert and  oriented x4;  grossly normal neurologically. Skin:  Intact without significant lesions or rashes.. Psych:  Alert and cooperative. Normal mood and affect.  Intake/Output from previous day: 03/04 0701 - 03/05 0700 In: 2570 [P.O.:520; I.V.:1950; IV Piggyback:100] Out: 675 [Urine:650; Blood:25] Intake/Output this shift: Total I/O In: 420 [P.O.:420] Out: -   Lab Results:  Recent Labs  05/17/16 0542 05/18/16 0604  WBC 4.5 7.2  HGB 10.4* 11.8*  HCT 30.2* 35.5*  PLT 181 223   BMET  Recent Labs  05/17/16 0542 05/18/16 0604 05/19/16 0609  NA 139 138 137  K 3.1* 3.7 4.1  CL 107 105 106    CO2 27 24 24   GLUCOSE 96 99 103*  BUN 15 14 12   CREATININE 0.82 0.79 0.89  CALCIUM 9.3 9.9 9.6   LFT  Recent Labs  05/19/16 0609  PROT 6.7  ALBUMIN 4.1  AST 268*  ALT 262*  ALKPHOS 164*  BILITOT 2.2*   PT/INR  Recent Labs  05/17/16 0542 05/18/16 0604  LABPROT 22.5* 16.1*  INR 1.95 1.28   Hepatitis Panel No results for input(s): HEPBSAG, HCVAB, HEPAIGM, HEPBIGM in the last 72 hours.   IMPRESSION:   #35  83 year old white male status post lap cholecystectomy yesterday for acute cholecystitis/cholelithiasis and found on IOC to have possible narrowing in the mid to distal common bile duct. He has had mildly elevated LFTs, increased today likely secondary to bile duct manipulation yesterday. CT of the abdomen and pelvis today shows no evidence of pancreatic mass, he does have a dilated common bile duct at 14 mm with smooth tapering at the ampulla. Unable to do MRCP secondary to pacemaker  Will need to rule out choledocholithiasis, and /or bile duct stricture with ERCP  #2 history of atrial fibrillation-on chronic Coumadin-reversed #3 status post pacemaker #4 history of prostate cancer status post seed implants  PLAN: #1 Will schedule for ERCP with Dr. Fuller Plan on Wednesday, 05/21/2016 at 8:30 AM. Procedure discussed in detail with the patient and family including risks and benefits #2 leave off Coumadin for now  and  consider restarting  heparin which will need to be stopped 6 hours prior to ERCP vs leave off until post ERCP #3 okay to advance diet to regular floor today and tomorrow, then nothing by mouth after midnight Tuesday night. #4 repeat hepatic panel in a.m. Thank you for the consult we will follow closely with you    Jacoba Cherney  05/19/2016, 1:34 PM

## 2016-05-19 NOTE — Progress Notes (Signed)
Spoke with MD Marlou Starks, said pt could be on regular diet. Orders in [place]

## 2016-05-19 NOTE — Progress Notes (Signed)
Progress Note  Patient Name: Manuel Lewis Date of Encounter: 05/19/2016  Primary Cardiologist: Caryl Comes  Subjective   83 y.o. male with a PMHx of chronic AF, pacer, HTN, prostate cancer , who was admitted to [facility] on 05/16/2016 for evaluation of abdominal pain  Found to have cholecystitis.  Had lap chole yesterday  Seems to be doing well Has possible stricture in CBD.  Did well from a cardiac standpoint  Inpatient Medications    Scheduled Meds: . amLODipine  10 mg Oral Q breakfast  .  ceFAZolin (ANCEF) IV  1 g Intravenous Q8H  . iopamidol      . potassium chloride SA  40 mEq Oral Daily   Continuous Infusions: . 0.9 % NaCl with KCl 20 mEq / L 75 mL/hr at 05/18/16 2020   PRN Meds: HYDROcodone-acetaminophen, iopamidol, morphine injection, ondansetron, traMADol   Vital Signs    Vitals:   05/18/16 1626 05/18/16 2202 05/19/16 0539 05/19/16 1010  BP: (!) 141/75 135/76 125/76 (!) 144/70  Pulse: (!) 59 64 75 65  Resp: 14 18 16    Temp: 99 F (37.2 C) 98.5 F (36.9 C) 98.8 F (37.1 C)   TempSrc: Oral Oral Oral   SpO2: 100% 96% 95%   Weight:      Height:        Intake/Output Summary (Last 24 hours) at 05/19/16 1211 Last data filed at 05/19/16 0756  Gross per 24 hour  Intake             2990 ml  Output              675 ml  Net             2315 ml   Filed Weights   05/16/16 0932 05/16/16 1817  Weight: 150 lb (68 kg) 147 lb 11.3 oz (67 kg)    Telemetry    Atrial fib with V pacing  - Personally Reviewed  ECG    Atrial fib with V pacing   - Personally Reviewed  Physical Exam   GEN: No acute distress.   Neck: No JVD Cardiac: RRR, no murmurs, rubs, or gallops.  Respiratory: Clear to auscultation bilaterally. GI: Soft, nontender, non-distended  MS: No edema; No deformity. Neuro:  Nonfocal  Psych: Normal affect   Labs    Chemistry  Recent Labs Lab 05/17/16 0542 05/18/16 0604 05/19/16 0609  NA 139 138 137  K 3.1* 3.7 4.1  CL 107 105 106  CO2 27 24  24   GLUCOSE 96 99 103*  BUN 15 14 12   CREATININE 0.82 0.79 0.89  CALCIUM 9.3 9.9 9.6  PROT 6.3* 7.5 6.7  ALBUMIN 3.9 4.5 4.1  AST 105* 69* 268*  ALT 116* 99* 262*  ALKPHOS 101 102 164*  BILITOT 1.1 1.6* 2.2*  GFRNONAA >60 >60 >60  GFRAA >60 >60 >60  ANIONGAP 5 9 7      Hematology  Recent Labs Lab 05/16/16 0941 05/17/16 0542 05/18/16 0604  WBC 7.5 4.5 7.2  RBC 3.89* 3.23* 3.75*  HGB 12.4* 10.4* 11.8*  HCT 36.0* 30.2* 35.5*  MCV 92.5 93.5 94.7  MCH 31.9 32.2 31.5  MCHC 34.4 34.4 33.2  RDW 13.4 13.5 13.6  PLT 219 181 223    Cardiac EnzymesNo results for input(s): TROPONINI in the last 168 hours.   Recent Labs Lab 05/16/16 0950  TROPIPOC 0.01     BNPNo results for input(s): BNP, PROBNP in the last 168 hours.   DDimer No  results for input(s): DDIMER in the last 168 hours.   Radiology    Ct Abdomen Pelvis W Wo Contrast  Result Date: 05/19/2016 CLINICAL DATA:  Status post laparoscopic cholecystectomy, CBD obstruction on intraoperative cholangiogram, possible CBD/pancreatic mass EXAM: CT ABDOMEN AND PELVIS WITHOUT AND WITH CONTRAST TECHNIQUE: Multidetector CT imaging of the abdomen and pelvis was performed following the standard protocol before and following the bolus administration of intravenous contrast. CONTRAST:  164mL ISOVUE-300 IOPAMIDOL (ISOVUE-300) INJECTION 61% COMPARISON:  Intraoperative cholangiogram dated 05/18/2016. Right upper quadrant ultrasound dated 05/16/2016. FINDINGS: Lower chest: Mild patchy left lower lobe opacity (series 3/image 1), possibly reflecting aspiration or pneumonia. Small bilateral pleural effusions, right greater than left. Associated mild left basilar atelectasis. Hepatobiliary: Liver is grossly unremarkable. Calcified granuloma in segment 8 (series 4/ image 23). Status post cholecystectomy. Mild fluid in the gallbladder fossa (series 13/image 44), postsurgical. No intrahepatic duct dilatation. Dilated common duct, measuring up to 14 mm at  its midportion (series 13/ image 44), smoothly tapering at the ampulla. No obstructing mass is seen. Pancreas: Within normal limits. No mass along the pancreatic head. No dilated pancreatic duct or atrophy along the pancreatic body/tail. Spleen: Within normal limits. Adrenals/Urinary Tract: Adrenal glands are within normal limits. Small right renal cysts measuring up to 1.1 cm (series 7/image 62). 7 mm left lower pole renal cyst (series 7/ image 62). No hydronephrosis. Bladder is mildly thick-walled/trabeculated, suggesting chronic bladder outlet obstruction. Nodular thickening along the left posterior bladder wall is favored to reflect enlargement of the central gland of the prostate rather than an irregular bladder wall. Stomach/Bowel: Stomach is within normal limits. No evidence of bowel obstruction. Prior appendectomy. Left colon is decompressed. Vascular/Lymphatic: No evidence of abdominal aortic aneurysm. Atherosclerotic calcifications of the abdominal aorta and branch vessels. No suspicious abdominopelvic lymphadenopathy. Reproductive: Brachytherapy seeds in the prostate. Enlargement/ nodularity of the central gland, which indents the left posterior bladder (series 7/image 114). Tumor is not excluded. Other: No abdominopelvic ascites. Scattered foci of free air, postsurgical. Additional postsurgical changes/gas along the right anterior abdominal wall. Moderate fat containing right inguinal hernia with fluid/stranding. Musculoskeletal: Degenerative changes of the visualized thoracolumbar spine. IMPRESSION: Status post cholecystectomy with associated postsurgical changes. Dilated common duct, measuring 14 mm at its midportion. No obstructing mass is evident on CT. Mild patchy left lower lobe opacity, possibly reflecting aspiration or pneumonia. Small bilateral pleural effusions with associated mild bibasilar atelectasis. Brachytherapy seeds in the prostate. Suspected BPH indenting the left posterior base of the  bladder, tumor not excluded. Additional ancillary findings as above. Electronically Signed   By: Julian Hy M.D.   On: 05/19/2016 10:15   Dg Cholangiogram Operative  Result Date: 05/18/2016 CLINICAL DATA:  Cholecystitis.  Status post cholecystectomy. EXAM: INTRAOPERATIVE CHOLANGIOGRAM TECHNIQUE: Cholangiographic images from the C-arm fluoroscopic device were submitted for interpretation post-operatively. Please see the procedural report for the amount of contrast and the fluoroscopy time utilized. COMPARISON:  Ultrasound 05/16/2016 FINDINGS: Four runs are submitted for interpretation, demonstrating opacification of a dilated common bile duct and intrahepatic radicles. There is apparent tapering of the common bile duct and failure of the midportion of the duct to opacified, raising question of mid common bile duct stricture. The distal portion of the duct fills faintly with contrast and also appears dilated. Contrast is identified within the duodenum. IMPRESSION: Possible stricture of the mid common bile duct. Further evaluation with MRCP is recommended. The study is reviewed with Dr. Lucia Gaskins. Electronically Signed   By: Nolon Nations M.D.  On: 05/18/2016 13:13    Cardiac Studies      Patient Profile     83 y.o. male with a PMHx of chronic AF, pacer, HTN, prostate cancer , who was admitted to [facility] on 05/16/2016 for evaluation of abdominal pain  .    Assessment & Plan    1.  Atrial fib:   Rate is well controlled.  S/p pacer for hx of bradycardia in the past    INR is now 1.26  Did well with surgery .   Restart coumadin when OK with Dr. Lucia Gaskins   Pharmacy to dose coumadin when given the OK from Dr. Lucia Gaskins  No active cardiac issues. Will sign off. Call for questions.   Signed, Mertie Moores, MD  05/19/2016, 12:11 PM

## 2016-05-19 NOTE — Progress Notes (Signed)
1 Day Post-Op  Subjective: Feeling well. Tolerating liquids.  Objective: Vital signs in last 24 hours: Temp:  [97.9 F (36.6 C)-99 F (37.2 C)] 98.8 F (37.1 C) (03/05 0539) Pulse Rate:  [58-75] 75 (03/05 0539) Resp:  [10-22] 16 (03/05 0539) BP: (125-141)/(67-92) 125/76 (03/05 0539) SpO2:  [95 %-100 %] 95 % (03/05 0539) Last BM Date: 05/18/16  Intake/Output from previous day: 03/04 0701 - 03/05 0700 In: 2570 [P.O.:520; I.V.:1950; IV Piggyback:100] Out: 675 [Urine:650; Blood:25] Intake/Output this shift: Total I/O In: 420 [P.O.:420] Out: -   Resp: clear to auscultation bilaterally Cardio: regular rate and rhythm GI: soft, minimal tenderness  Lab Results:   Recent Labs  05/17/16 0542 05/18/16 0604  WBC 4.5 7.2  HGB 10.4* 11.8*  HCT 30.2* 35.5*  PLT 181 223   BMET  Recent Labs  05/18/16 0604 05/19/16 0609  NA 138 137  K 3.7 4.1  CL 105 106  CO2 24 24  GLUCOSE 99 103*  BUN 14 12  CREATININE 0.79 0.89  CALCIUM 9.9 9.6   PT/INR  Recent Labs  05/17/16 0542 05/18/16 0604  LABPROT 22.5* 16.1*  INR 1.95 1.28   ABG No results for input(s): PHART, HCO3 in the last 72 hours.  Invalid input(s): PCO2, PO2  Studies/Results: Dg Cholangiogram Operative  Result Date: 05/18/2016 CLINICAL DATA:  Cholecystitis.  Status post cholecystectomy. EXAM: INTRAOPERATIVE CHOLANGIOGRAM TECHNIQUE: Cholangiographic images from the C-arm fluoroscopic device were submitted for interpretation post-operatively. Please see the procedural report for the amount of contrast and the fluoroscopy time utilized. COMPARISON:  Ultrasound 05/16/2016 FINDINGS: Four runs are submitted for interpretation, demonstrating opacification of a dilated common bile duct and intrahepatic radicles. There is apparent tapering of the common bile duct and failure of the midportion of the duct to opacified, raising question of mid common bile duct stricture. The distal portion of the duct fills faintly with  contrast and also appears dilated. Contrast is identified within the duodenum. IMPRESSION: Possible stricture of the mid common bile duct. Further evaluation with MRCP is recommended. The study is reviewed with Dr. Lucia Gaskins. Electronically Signed   By: Nolon Nations M.D.   On: 05/18/2016 13:13    Anti-infectives: Anti-infectives    Start     Dose/Rate Route Frequency Ordered Stop   05/18/16 1900  ceFAZolin (ANCEF) IVPB 1 g/50 mL premix     1 g 100 mL/hr over 30 Minutes Intravenous Every 8 hours 05/18/16 1356 05/19/16 2159      Assessment/Plan: s/p Procedure(s): LAPAROSCOPIC CHOLECYSTECTOMY WITH INTRAOPERATIVE CHOLANGIOGRAM (N/A) stay on clears for now  Will review CT to rule out mass causing stricture of bile duct Would recommend GI consult  LOS: 3 days    TOTH III,Junella Domke S 05/19/2016

## 2016-05-20 LAB — HEPATIC FUNCTION PANEL
ALT: 160 U/L — ABNORMAL HIGH (ref 17–63)
AST: 116 U/L — ABNORMAL HIGH (ref 15–41)
Albumin: 3.9 g/dL (ref 3.5–5.0)
Alkaline Phosphatase: 129 U/L — ABNORMAL HIGH (ref 38–126)
BILIRUBIN DIRECT: 0.5 mg/dL (ref 0.1–0.5)
BILIRUBIN INDIRECT: 1.1 mg/dL — AB (ref 0.3–0.9)
Total Bilirubin: 1.6 mg/dL — ABNORMAL HIGH (ref 0.3–1.2)
Total Protein: 6.1 g/dL — ABNORMAL LOW (ref 6.5–8.1)

## 2016-05-20 LAB — CEA: CEA: 2.3 ng/mL (ref 0.0–4.7)

## 2016-05-20 LAB — CANCER ANTIGEN 19-9: CA 19 9: 35 U/mL (ref 0–35)

## 2016-05-20 NOTE — Progress Notes (Signed)
Patient ID: Manuel Lewis, male   DOB: 08/23/1921, 83 y.o.   MRN: [id number]    Progress Note   Subjective   Doing well, in good spirits- has been up walking  No c/o abdominal pain    Objective   Vital signs in last 24 hours: Temp:  [98.1 F (36.7 C)-98.7 F (37.1 C)] 98.1 F (36.7 C) (03/06 0520) Pulse Rate:  [60-82] 72 (03/06 0520) Resp:  [18] 18 (03/06 0520) BP: (134-144)/(65-77) 134/75 (03/06 0520) SpO2:  [98 %-100 %] 100 % (03/06 0520) Last BM Date: 05/19/16 General:    Elderly WM  in NAD Heart:  Regular rate and rhythm; no murmurs Lungs: Respirations even and unlabored, lungs CTA bilaterally Abdomen:  Soft, nontender and nondistended. Normal bowel sounds. Extremities:  Without edema. Neurologic:  Alert and oriented,  grossly normal neurologically. Psych:  Cooperative. Normal mood and affect.  Intake/Output from previous day: 03/05 0701 - 03/06 0700 In: 780 [P.O.:780] Out: 700 [Urine:700] Intake/Output this shift: Total I/O In: 240 [P.O.:240] Out: 150 [Urine:150]  Lab Results:  Recent Labs  05/18/16 0604  WBC 7.2  HGB 11.8*  HCT 35.5*  PLT 223   BMET  Recent Labs  05/18/16 0604 05/19/16 0609  NA 138 137  K 3.7 4.1  CL 105 106  CO2 24 24  GLUCOSE 99 103*  BUN 14 12  CREATININE 0.79 0.89  CALCIUM 9.9 9.6   LFT  Recent Labs  05/20/16 0650  PROT 6.1*  ALBUMIN 3.9  AST 116*  ALT 160*  ALKPHOS 129*  BILITOT 1.6*  BILIDIR 0.5  IBILI 1.1*   PT/INR  Recent Labs  05/18/16 0604  LABPROT 16.1*  INR 1.28    Studies/Results: Ct Abdomen Pelvis W Wo Contrast  Result Date: 05/19/2016 CLINICAL DATA:  Status post laparoscopic cholecystectomy, CBD obstruction on intraoperative cholangiogram, possible CBD/pancreatic mass EXAM: CT ABDOMEN AND PELVIS WITHOUT AND WITH CONTRAST TECHNIQUE: Multidetector CT imaging of the abdomen and pelvis was performed following the standard protocol before and following the bolus administration of intravenous  contrast. CONTRAST:  100mL ISOVUE-300 IOPAMIDOL (ISOVUE-300) INJECTION 61% COMPARISON:  Intraoperative cholangiogram dated 05/18/2016. Right upper quadrant ultrasound dated 05/16/2016. FINDINGS: Lower chest: Mild patchy left lower lobe opacity (series 3/image 1), possibly reflecting aspiration or pneumonia. Small bilateral pleural effusions, right greater than left. Associated mild left basilar atelectasis. Hepatobiliary: Liver is grossly unremarkable. Calcified granuloma in segment 8 (series 4/ image 23). Status post cholecystectomy. Mild fluid in the gallbladder fossa (series 13/image 44), postsurgical. No intrahepatic duct dilatation. Dilated common duct, measuring up to 14 mm at its midportion (series 13/ image 44), smoothly tapering at the ampulla. No obstructing mass is seen. Pancreas: Within normal limits. No mass along the pancreatic head. No dilated pancreatic duct or atrophy along the pancreatic body/tail. Spleen: Within normal limits. Adrenals/Urinary Tract: Adrenal glands are within normal limits. Small right renal cysts measuring up to 1.1 cm (series 7/image 62). 7 mm left lower pole renal cyst (series 7/ image 62). No hydronephrosis. Bladder is mildly thick-walled/trabeculated, suggesting chronic bladder outlet obstruction. Nodular thickening along the left posterior bladder wall is favored to reflect enlargement of the central gland of the prostate rather than an irregular bladder wall. Stomach/Bowel: Stomach is within normal limits. No evidence of bowel obstruction. Prior appendectomy. Left colon is decompressed. Vascular/Lymphatic: No evidence of abdominal aortic aneurysm. Atherosclerotic calcifications of the abdominal aorta and branch vessels. No suspicious abdominopelvic lymphadenopathy. Reproductive: Brachytherapy seeds in the prostate. Enlargement/ nodularity of the central   gland, which indents the left posterior bladder (series 7/image 114). Tumor is not excluded. Other: No abdominopelvic  ascites. Scattered foci of free air, postsurgical. Additional postsurgical changes/gas along the right anterior abdominal wall. Moderate fat containing right inguinal hernia with fluid/stranding. Musculoskeletal: Degenerative changes of the visualized thoracolumbar spine. IMPRESSION: Status post cholecystectomy with associated postsurgical changes. Dilated common duct, measuring 14 mm at its midportion. No obstructing mass is evident on CT. Mild patchy left lower lobe opacity, possibly reflecting aspiration or pneumonia. Small bilateral pleural effusions with associated mild bibasilar atelectasis. Brachytherapy seeds in the prostate. Suspected BPH indenting the left posterior base of the bladder, tumor not excluded. Additional ancillary findings as above. Electronically Signed   By: Sriyesh  Krishnan M.D.   On: 05/19/2016 10:15   Dg Cholangiogram Operative  Result Date: 05/18/2016 CLINICAL DATA:  Cholecystitis.  Status post cholecystectomy. EXAM: INTRAOPERATIVE CHOLANGIOGRAM TECHNIQUE: Cholangiographic images from the C-arm fluoroscopic device were submitted for interpretation post-operatively. Please see the procedural report for the amount of contrast and the fluoroscopy time utilized. COMPARISON:  Ultrasound 05/16/2016 FINDINGS: Four runs are submitted for interpretation, demonstrating opacification of a dilated common bile duct and intrahepatic radicles. There is apparent tapering of the common bile duct and failure of the midportion of the duct to opacified, raising question of mid common bile duct stricture. The distal portion of the duct fills faintly with contrast and also appears dilated. Contrast is identified within the duodenum. IMPRESSION: Possible stricture of the mid common bile duct. Further evaluation with MRCP is recommended. The study is reviewed with Dr. Newman. Electronically Signed   By: Elizabeth  Brown M.D.   On: 05/18/2016 13:13       Assessment / Plan:    #1 83 yo WM s/p lap chole  on 05/18/2016 - abnormal IOC with question of mid CBD stricture, elevated LFT's and CT shows dilated  CBD at 14 mm.,  no stone or obstruction  Noted. Unable to do MRCP as pt has pacer  R/O choledocholithiasis, CBD stricture  LFT's  are trending down   #2 atrial fib- coumadin on hold- INR 1.28  05/18/16 #3 s/p pacer  #4 hx prostate CA  Plan;  Plan is For ERCP with Dr Stark at 8:30 am tomorrow  NPO after midnight Unasyn pre-procedure Repeat labs in am   Active Problems:   Acute cholecystitis   Complication of anesthesia   Gallstones   Elevated liver function tests     LOS: 4 days   Edwin Baines  05/20/2016, 9:57 AM  

## 2016-05-20 NOTE — Anesthesia Preprocedure Evaluation (Addendum)
Anesthesia Evaluation  Patient identified by MRN, date of birth, ID band Patient awake    Reviewed: Allergy & Precautions, NPO status , Patient's Chart, lab work & pertinent test results  History of Anesthesia Complications Negative for: history of anesthetic complications  Airway Mallampati: II  TM Distance: >3 FB Neck ROM: Full    Dental no notable dental hx. (+) Teeth Intact, Dental Advisory Given   Pulmonary neg pulmonary ROS,    Pulmonary exam normal breath sounds clear to auscultation       Cardiovascular hypertension, Pt. on medications Normal cardiovascular exam+ dysrhythmias Atrial Fibrillation + pacemaker  Rhythm:Regular Rate:Normal     Neuro/Psych negative neurological ROS  negative psych ROS   GI/Hepatic Neg liver ROS,   Endo/Other  negative endocrine ROS  Renal/GU negative Renal ROS     Musculoskeletal   Abdominal   Peds  Hematology negative hematology ROS (+)   Anesthesia Other Findings   Reproductive/Obstetrics                             Anesthesia Physical  Anesthesia Plan  ASA: III  Anesthesia Plan: General   Post-op Pain Management:    Induction: Intravenous  Airway Management Planned: Oral ETT  Additional Equipment:   Intra-op Plan:   Post-operative Plan: Extubation in OR  Informed Consent: I have reviewed the patients History and Physical, chart, labs and discussed the procedure including the risks, benefits and alternatives for the proposed anesthesia with the patient or authorized representative who has indicated his/her understanding and acceptance.   Dental advisory given  Plan Discussed with: CRNA  Anesthesia Plan Comments:       Anesthesia Quick Evaluation

## 2016-05-20 NOTE — Progress Notes (Signed)
Looks good ambulating eating and no pain No new issues --labs overall show downward trend of LFT, Bili 1.6 range -CEA and Ca 19-9 are all nef and Ct abd non-specific For EGD am to clarify if stone or mass Will follow again am   Verneita Griffes, MD [facility] [phone]

## 2016-05-21 ENCOUNTER — Inpatient Hospital Stay (HOSPITAL_COMMUNITY): Payer: Medicare Other | Admitting: Anesthesiology

## 2016-05-21 ENCOUNTER — Encounter (HOSPITAL_COMMUNITY): Payer: Self-pay | Admitting: Registered Nurse

## 2016-05-21 ENCOUNTER — Inpatient Hospital Stay (HOSPITAL_COMMUNITY): Payer: Medicare Other

## 2016-05-21 ENCOUNTER — Encounter (HOSPITAL_COMMUNITY)
Admission: EM | Disposition: A | Discharge status: Home / Self Care | Payer: Self-pay | Source: Home / Self Care | Attending: Family Medicine

## 2016-05-21 DIAGNOSIS — R932 Abnormal findings on diagnostic imaging of liver and biliary tract: Secondary | ICD-10-CM

## 2016-05-21 DIAGNOSIS — R7989 Other specified abnormal findings of blood chemistry: Secondary | ICD-10-CM

## 2016-05-21 DIAGNOSIS — K831 Obstruction of bile duct: Secondary | ICD-10-CM

## 2016-05-21 HISTORY — PX: ERCP: SHX5425

## 2016-05-21 LAB — COMPREHENSIVE METABOLIC PANEL
ALBUMIN: 4.3 g/dL (ref 3.5–5.0)
ALT: 124 U/L — AB (ref 17–63)
AST: 65 U/L — AB (ref 15–41)
Alkaline Phosphatase: 133 U/L — ABNORMAL HIGH (ref 38–126)
Anion gap: 8 (ref 5–15)
BILIRUBIN TOTAL: 1.2 mg/dL (ref 0.3–1.2)
BUN: 17 mg/dL (ref 6–20)
CALCIUM: 9.8 mg/dL (ref 8.9–10.3)
CO2: 24 mmol/L (ref 22–32)
Chloride: 106 mmol/L (ref 101–111)
Creatinine, Ser: 0.99 mg/dL (ref 0.61–1.24)
GFR calc Af Amer: >60 mL/min (ref >60)
GFR calc non Af Amer: >60 mL/min (ref >60)
GLUCOSE: 108 mg/dL — AB (ref 65–99)
Potassium: 3.8 mmol/L (ref 3.5–5.1)
Sodium: 138 mmol/L (ref 135–145)
TOTAL PROTEIN: 7 g/dL (ref 6.5–8.1)

## 2016-05-21 SURGERY — ERCP, WITH INTERVENTION IF INDICATED
Anesthesia: General

## 2016-05-21 MED ORDER — INDOMETHACIN 50 MG RE SUPP
RECTAL | Status: DC | PRN
  Administered 2016-05-21: 100 mg via RECTAL

## 2016-05-21 MED ORDER — ONDANSETRON HCL 4 MG/2ML IJ SOLN
INTRAMUSCULAR | Status: AC
  Filled 2016-05-21: qty 2

## 2016-05-21 MED ORDER — SUCCINYLCHOLINE CHLORIDE 200 MG/10ML IV SOSY
PREFILLED_SYRINGE | INTRAVENOUS | Status: AC
  Filled 2016-05-21: qty 10

## 2016-05-21 MED ORDER — PROPOFOL 10 MG/ML IV BOLUS
INTRAVENOUS | Status: AC
  Filled 2016-05-21: qty 20

## 2016-05-21 MED ORDER — GLUCAGON HCL RDNA (DIAGNOSTIC) 1 MG IJ SOLR
INTRAMUSCULAR | Status: DC | PRN
  Administered 2016-05-21: .25 mg via INTRAVENOUS
  Administered 2016-05-21: 0.25 mg via INTRAVENOUS

## 2016-05-21 MED ORDER — INDOMETHACIN 50 MG RE SUPP
RECTAL | Status: AC
  Filled 2016-05-21: qty 2

## 2016-05-21 MED ORDER — LACTATED RINGERS IV SOLN
INTRAVENOUS | Status: DC | PRN
  Administered 2016-05-21: 08:00:00 via INTRAVENOUS

## 2016-05-21 MED ORDER — LACTATED RINGERS IV SOLN
INTRAVENOUS | Status: DC
  Administered 2016-05-21: 08:00:00 via INTRAVENOUS

## 2016-05-21 MED ORDER — FENTANYL CITRATE (PF) 100 MCG/2ML IJ SOLN
INTRAMUSCULAR | Status: AC
  Filled 2016-05-21: qty 2

## 2016-05-21 MED ORDER — SODIUM CHLORIDE 0.9 % IV SOLN
INTRAVENOUS | Status: DC | PRN
  Administered 2016-05-21: 20 mL

## 2016-05-21 MED ORDER — LIDOCAINE 2% (20 MG/ML) 5 ML SYRINGE
INTRAMUSCULAR | Status: AC
  Filled 2016-05-21: qty 5

## 2016-05-21 MED ORDER — PROPOFOL 10 MG/ML IV BOLUS
INTRAVENOUS | Status: DC | PRN
Start: 2016-05-21 — End: 2016-05-21
  Administered 2016-05-21: 90 mg via INTRAVENOUS

## 2016-05-21 MED ORDER — DEXAMETHASONE SODIUM PHOSPHATE 10 MG/ML IJ SOLN
INTRAMUSCULAR | Status: AC
  Filled 2016-05-21: qty 1

## 2016-05-21 MED ORDER — GLUCAGON HCL RDNA (DIAGNOSTIC) 1 MG IJ SOLR
INTRAMUSCULAR | Status: AC
  Filled 2016-05-21: qty 1

## 2016-05-21 MED ORDER — LIDOCAINE HCL (CARDIAC) 20 MG/ML IV SOLN
INTRAVENOUS | Status: DC | PRN
  Administered 2016-05-21: 50 mg via INTRAVENOUS
  Administered 2016-05-21: 25 mg via INTRATRACHEAL

## 2016-05-21 MED ORDER — INDOMETHACIN 50 MG RE SUPP: 100.0000 mg | Freq: Once | RECTAL | Status: DC

## 2016-05-21 NOTE — Anesthesia Procedure Notes (Signed)
Procedure Name: Intubation Date/Time: 05/21/2016 8:49 AM Performed by: Nolon Nations Pre-anesthesia Checklist: Patient identified, Emergency Drugs available, Suction available and Patient being monitored Patient Re-evaluated:Patient Re-evaluated prior to inductionOxygen Delivery Method: Circle system utilized Preoxygenation: Pre-oxygenation with 100% oxygen Intubation Type: IV induction Ventilation: Mask ventilation without difficulty Laryngoscope Size: Mac and 4 Grade View: Grade II Tube type: Oral Number of attempts: 1 Airway Equipment and Method: Stylet and Oral airway Placement Confirmation: ETT inserted through vocal cords under direct vision,  positive ETCO2 and breath sounds checked- equal and bilateral Secured at: 22 cm Tube secured with: Tape Dental Injury: Teeth and Oropharynx as per pre-operative assessment

## 2016-05-21 NOTE — Discharge Instructions (Addendum)
LAPAROSCOPIC SURGERY: POST OP INSTRUCTIONS  1. DIET: Follow a light bland diet the first 24 hours after arrival home, such as soup, liquids, crackers, etc. Be sure to include lots of fluids daily. Avoid fast food or heavy meals as your are more likely to get nauseated. Eat a low fat the next few days after surgery.  2. Take your usually prescribed home medications unless otherwise directed. 3. PAIN CONTROL:  1. Pain is best controlled by a usual combination of three different methods TOGETHER:  1. Ice/Heat 2. Over the counter pain medication 3. Prescription pain medication 2. Most patients will experience some swelling and bruising around the incisions. Ice packs or heating pads (30-60 minutes up to 6 times a day) will help. Use ice for the first few days to help decrease swelling and bruising, then switch to heat to help relax tight/sore spots and speed recovery. Some people prefer to use ice alone, heat alone, alternating between ice & heat. Experiment to what works for you. Swelling and bruising can take several weeks to resolve.  3. It is helpful to take an over-the-counter pain medication regularly for the first few weeks. Choose one of the following that works best for you:  1. Naproxen (Aleve, etc) Two 220mg  tabs twice a day 2. Ibuprofen (Advil, etc) Three 200mg  tabs four times a day (every meal & bedtime) 3. Acetaminophen (Tylenol, etc) 500-650mg  four times a day (every meal & bedtime) 4. A prescription for pain medication (such as oxycodone, hydrocodone, etc) should be given to you upon discharge. Take your pain medication as prescribed.  1. If you are having problems/concerns with the prescription medicine (does not control pain, nausea, vomiting, rash, itching, etc), please call us [phone] to see if we need to switch you to a different pain medicine that will work better for you and/or control your side effect better. 2. If you need a refill on your pain medication, please contact  your pharmacy. They will contact our office to request authorization. Prescriptions will not be filled after 5 pm or on week-ends. 4. Avoid getting constipated. Between the surgery and the pain medications, it is common to experience some constipation. Increasing fluid intake and taking a fiber supplement (such as Metamucil, Citrucel, FiberCon, MiraLax, etc) 1-2 times a day regularly will usually help prevent this problem from occurring. A mild laxative (prune juice, Milk of Magnesia, MiraLax, etc) should be taken according to package directions if there are no bowel movements after 48 hours.  5. Watch out for diarrhea. If you have many loose bowel movements, simplify your diet to bland foods & liquids for a few days. Stop any stool softeners and decrease your fiber supplement. Switching to mild anti-diarrheal medications (Kayopectate, Pepto Bismol) can help. If this worsens or does not improve, please call us. 6. Wash / shower every day. You may shower over the dressings as they are waterproof. Continue to shower over incision(s) after the dressing is off. If there is glue over the incisions try not to pick it off, let it fall off naturally. 7. Remove your waterproof bandages 5 days after surgery. You may leave the incision open to air. You may replace a dressing/Band-Aid to cover the incision for comfort if you wish.  8. ACTIVITIES as tolerated:  1. You may resume regular (light) daily activities beginning the next day--such as daily self-care, walking, climbing stairs--gradually increasing activities as tolerated. If you can walk 30 minutes without difficulty, it is safe to try more intense activity  such as jogging, treadmill, bicycling, low-impact aerobics, swimming, etc. 2. Save the most intensive and strenuous activity for last such as sit-ups, heavy lifting, contact sports, etc Refrain from any heavy lifting or straining until you are off narcotics for pain control. For the first 2-3 weeks do not lift  over 10-15lb.  3. DO NOT PUSH THROUGH PAIN. Let pain be your guide: If it hurts to do something, don't do it. Pain is your body warning you to avoid that activity for another week until the pain goes down. 4. You may drive when you are no longer taking prescription pain medication, you can comfortably wear a seatbelt, and you can safely maneuver your car and apply brakes. 5. You may have sexual intercourse when it is comfortable.  9. FOLLOW UP in our office  1. Please call [facility] at [phone] to set up an appointment to see your surgeon in the office for a follow-up appointment approximately 2-3 weeks after your surgery. 2. Make sure that you call for this appointment the day you arrive home to insure a convenient appointment time.      10. IF YOU HAVE DISABILITY OR FAMILY LEAVE FORMS, BRING THEM TO THE               OFFICE FOR PROCESSING.   WHEN TO CALL us [phone]:  1. Poor pain control 2. Reactions / problems with new medications (rash/itching, nausea, etc)  3. Fever over 101.5 F (38.5 C) 4. Inability to urinate 5. Nausea and/or vomiting 6. Worsening swelling or bruising 7. Continued bleeding from incision. 8. Increased pain, redness, or drainage from the incision  The clinic staff is available to answer your questions during regular business hours (8:30am-5pm). Please dont hesitate to call and ask to speak to one of our nurses for clinical concerns.  If you have a medical emergency, go to the nearest emergency room or call 911.  A surgeon from [facility] is always on call at the [facility], [address], [place], [place], [place] [address] ?  MAIN: [phone] ? TOLL FREE: [phone] ?  FAX [phone]  [email or web address]    Follow with [name], MD in 5-7 days  - Avoid aspirin and nonsteroidal anti-inflammatory medicines for 2 weeks. - Repeat Liver Function Tests in 1 month. - OK to  resume Coumadin in 2 days as indicated following ERCP, resume 05/23/2016  Please get a complete blood count and chemistry panel checked by your Primary MD at your next visit, and again as instructed by your Primary MD. Please get your medications reviewed and adjusted by your Primary MD.  Please request your Primary MD to go over all Hospital Tests and Procedure/Radiological results at the follow up, please get all Hospital records sent to your Prim MD by signing hospital release before you go home.  If you had Pneumonia of Lung problems at the Hospital: Please get a 2 view Chest X ray done in 6-8 weeks after hospital discharge or sooner if instructed by your Primary MD.  If you have Congestive Heart Failure: Please call your Cardiologist or Primary MD anytime you have any of the following symptoms:  1) 3 pound weight gain in 24 hours or 5 pounds in 1 week  2) shortness of breath, with or without a dry hacking cough  3) swelling in the hands, feet or stomach  4) if you have to sleep on extra pillows at night in  order to breathe  Follow cardiac low salt diet and 1.5 lit/day fluid restriction.  If you have diabetes Accuchecks 4 times/day, Once in AM empty stomach and then before each meal. Log in all results and show them to your primary doctor at your next visit. If any glucose reading is under 80 or above 300 call your primary MD immediately.  If you have Seizure/Convulsions/Epilepsy: Please do not drive, operate heavy machinery, participate in activities at heights or participate in high speed sports until you have seen by Primary MD or a Neurologist and advised to do so again.  If you had Gastrointestinal Bleeding: Please ask your Primary MD to check a complete blood count within one week of discharge or at your next visit. Your endoscopic/colonoscopic biopsies that are pending at the time of discharge, will also need to followed by your Primary MD.  Get Medicines reviewed and  adjusted. Please take all your medications with you for your next visit with your Primary MD  Please request your Primary MD to go over all hospital tests and procedure/radiological results at the follow up, please ask your Primary MD to get all Hospital records sent to his/her office.  If you experience worsening of your admission symptoms, develop shortness of breath, life threatening emergency, suicidal or homicidal thoughts you must seek medical attention immediately by calling 911 or calling your MD immediately  if symptoms less severe.  You must read complete instructions/literature along with all the possible adverse reactions/side effects for all the Medicines you take and that have been prescribed to you. Take any new Medicines after you have completely understood and accpet all the possible adverse reactions/side effects.   Do not drive or operate heavy machinery when taking Pain medications.   Do not take more than prescribed Pain, Sleep and Anxiety Medications  Special Instructions: If you have smoked or chewed Tobacco  in the last 2 yrs please stop smoking, stop any regular Alcohol  and or any Recreational drug use.  Wear Seat belts while driving.  Please note You were cared for by a hospitalist during your hospital stay. If you have any questions about your discharge medications or the care you received while you were in the hospital after you are discharged, you can call the unit and asked to speak with the hospitalist on call if the hospitalist that took care of you is not available. Once you are discharged, your primary care physician will handle any further medical issues. Please note that NO REFILLS for any discharge medications will be authorized once you are discharged, as it is imperative that you return to your primary care physician (or establish a relationship with a primary care physician if you do not have one) for your aftercare needs so that they can reassess your need  for medications and monitor your lab values.  You can reach the hospitalist office at phone [phone] or fax [phone]   If you do not have a primary care physician, you can call [phone] for a physician referral.  Activity: As tolerated with Full fall precautions use walker/cane & assistance as needed  Diet: regular  Disposition Home

## 2016-05-21 NOTE — Op Note (Signed)
[facility] Patient Name: Manuel Lewis Procedure Date: 05/21/2016 MRN: [id number] Attending MD: Ladene Artist , MD Date of Birth: Apr 12, 1921 CSN: [id number] Age: 83 Admit Type: Inpatient Procedure:                ERCP Indications:              Abnormal intraoperative cholangiogram, Biliary                            dilation on imaging, Elevated liver enzymes Providers:                Pricilla Riffle. Fuller Plan, MD, Cleda Daub, RN, William Dalton, Technician Referring MD:             Alphonsa Overall, MD Medicines:                General Anesthesia Complications:            No immediate complications. Estimated Blood Loss:     Estimated blood loss: none. Procedure:                Pre-Anesthesia Assessment:                           - Prior to the procedure, a History and Physical                            was performed, and patient medications and                            allergies were reviewed. The patient's tolerance of                            previous anesthesia was also reviewed. The risks                            and benefits of the procedure and the sedation                            options and risks were discussed with the patient.                            All questions were answered, and informed consent                            was obtained. Prior Anticoagulants: The patient has                            taken Coumadin (warfarin), last dose was 6 days                            prior to procedure. ASA Grade Assessment: III - A  patient with severe systemic disease. After                            reviewing the risks and benefits, the patient was                            deemed in satisfactory condition to undergo the                            procedure.                           After obtaining informed consent, the scope was                            passed under direct vision. Throughout the                             procedure, the patient's blood pressure, pulse, and                            oxygen saturations were monitored continuously. The                            [id number] ([id number]) scope was introduced through                            the mouth, and used to inject contrast into and                            used to cannulate the bile duct. The ERCP was                            accomplished without difficulty. The patient                            tolerated the procedure well. Findings:      A scout film of the abdomen was obtained. Surgical clips, consistent       with a previous cholecystectomy, were seen in the area of the right       upper quadrant of the abdomen and contrast was noted in the bowel. The       esophagus was successfully intubated under direct vision. The scope was       advanced to a normal major papilla in the descending duodenum without       detailed examination of the pharynx, larynx and associated structures,       and upper GI tract. The upper GI tract was grossly normal. The bile duct       was deeply cannulated with a straight Roadrunner guidewire and then a       short-nosed traction sphincterotome. Contrast was injected. I personally       interpreted the bile duct images. Ductal flow of contrast was adequate.       The common bile duct was diffusely dilated, with a presumed obstruction.       The largest  diameter was 12 mm. The CBD smoothly tapered at the ampulla.       Although not imaged, CBD stones/sludge was suspected. The left and right       hepatic ducts and all intrahepatic branches appeared normal although       they were not completely filled. A 10 mm biliary sphincterotomy was made       with a traction (standard) sphincterotome using ERBE electrocautery.       There was no post-sphincterotomy bleeding. To discover objects, the       biliary tree was swept twice with a 12 mm balloon starting at the upper       third of  the main bile duct. Nothing was found. There was excellent       biliary drainage following the sphincterotomy and pull through. The PD       was not cannulated or injected by intention. Impression:               - The common bile duct was dilated, with a persumed                            obstruction.                           - A biliary sphincterotomy was performed.                           - The biliary tree was swept and nothing was found. Moderate Sedation:      N/A- Per Anesthesia Care Recommendation:           - Avoid aspirin and nonsteroidal anti-inflammatory                            medicines for 2 weeks.                           - Clear liquid diet for 4 hours, then advance as                            tolerated to soft diet today.                           - Return patient to hospital ward for ongoing care.                           - Repeat LFTs in 1 month.                           - OK to resume Coumadin in 2 days as indicated. Procedure Code(s):        --- Professional ---                           [phone], Endoscopic retrograde                            cholangiopancreatography (ERCP); with  sphincterotomy/papillotomy Diagnosis Code(s):        --- Professional ---                           K83.1, Obstruction of bile duct                           R74.8, Abnormal levels of other serum enzymes                           K83.8, Other specified diseases of biliary tract                           R93.2, Abnormal findings on diagnostic imaging of                            liver and biliary tract CPT copyright 2016 [employer]. All rights reserved. The codes documented in this report are preliminary and upon coder review may  be revised to meet current compliance requirements. Ladene Artist, MD 05/21/2016 9:38:59 AM This report has been signed electronically. Number of Addenda: 0

## 2016-05-21 NOTE — Interval H&P Note (Signed)
History and Physical Interval Note:  05/21/2016 8:38 AM  Manuel Lewis  has presented today for surgery, with the diagnosis of abnormal CBD, elevated LFT's  The various methods of treatment have been discussed with the patient and family. After consideration of risks, benefits and other options for treatment, the patient has consented to  Procedure(s): ENDOSCOPIC RETROGRADE CHOLANGIOPANCREATOGRAPHY (ERCP) (N/A) as a surgical intervention .  The patient's history has been reviewed, patient examined, no change in status, stable for surgery.  I have reviewed the patient's chart and labs.  Questions were answered to the patient's satisfaction.     Pricilla Riffle. Fuller Plan

## 2016-05-21 NOTE — Transfer of Care (Signed)
Immediate Anesthesia Transfer of Care Note  Patient: Manuel Lewis  Procedure(s) Performed: Procedure(s): ENDOSCOPIC RETROGRADE CHOLANGIOPANCREATOGRAPHY (ERCP) (N/A)  Patient Location: PACU  Anesthesia Type:General  Level of Consciousness: awake, alert , oriented and patient cooperative  Airway & Oxygen Therapy: Patient Spontanous Breathing and Patient connected to face mask oxygen  Post-op Assessment: Report given to RN, Post -op Vital signs reviewed and stable and Patient moving all extremities X 4  Post vital signs: stable  Last Vitals:  Vitals:   05/21/16 0608 05/21/16 0801  BP: 137/69 (!) 145/65  Pulse: 71 73  Resp:  14  Temp: 37.1 C 36.9 C    Last Pain:  Vitals:   05/21/16 0801  TempSrc:   PainSc: 0-No pain      Patients Stated Pain Goal: 2 (37/16/96 7893)  Complications: No apparent anesthesia complications

## 2016-05-21 NOTE — Progress Notes (Signed)
PROGRESS NOTE  Manuel Lewis VFI:[id number] DOB: Jul 31, 1921 DOA: 05/16/2016 PCP: [name], MD   LOS: 5 days   Brief Narrative: 83 year old male with history of A. fib on chronic Coumadin, prostate cancer, pacemaker, admitted on 3/2 with abdominal pain, found to have cholecystitis status post cholecystectomy in 3/4.  Given concern for CBD stricture with intraoperative cholangiogram, patient underwent ERCP on 3/7   Assessment & Plan: Active Problems:   Acute cholecystitis   Complication of anesthesia   Gallstones   Elevated LFTs   Common bile duct (CBD) obstruction   Abnormal cholangiogram  Cholecystitis vs passed CBD stone -General surgery and gastroenterology were consulted.  Patient is status post cholecystectomy on 3/4.  An intraoperative cholangiogram was done at that time, showed an impression of CBD narrowing.  Gastroenterology then followed up with an ERCP on 3/7, without any significant findings. -Clinically he is improving, advance diet today, GI wants to monitor him overnight, and discharge home tomorrow if he remains stable.  LFTs are improving, bilirubin has now normalized.  Permanent atrial fibrillation  -Mali score 4, bradycardia status post pacemaker 2011 -stable not requiring any antiarrhythmic. -For GI, okay to resume Coumadin in 2 days following the ERCP, will advise patient to resume on 3/9  Prostate Ca s/p Radiation seeds 1998 -follow with Urology at [facility] yearly  Obstructive uropathy s/p urethral dilatation -currently stable  Stable hypertension -hold HCTZ and losartan, continue amlodipine 10 daily  Prior macular degeneration  -continue Ocuvite vitamins   DVT prophylaxis: SCDs Code Status: Full code Family Communication: Discussed with wife and daughter at bedside.  Try to reach son and left a message Disposition Plan: Home in 1 day  Consultants:   Gastroenterology  General surgery  Procedures:   Cholecystectomy  ERCP  3/7 Impression:               - The common bile duct was dilated, with a persumed                            obstruction.                           - A biliary sphincterotomy was performed.                           - The biliary tree was swept and nothing was found. Moderate Sedation:      N/A- Per Anesthesia Care Recommendation:           - Avoid aspirin and nonsteroidal anti-inflammatory                            medicines for 2 weeks.                           - Clear liquid diet for 4 hours, then advance as                            tolerated to soft diet today.                           - Return patient to hospital ward for ongoing care.                           -  Repeat LFTs in 1 month.                           - OK to resume Coumadin in 2 days as indicated.  Antimicrobials:  Unasyn (pre-ERCP)  Subjective: -Seen postprocedure, he feels great, wants to go home as soon as possible  Objective: Vitals:   05/21/16 1023 05/21/16 1034 05/21/16 1111 05/21/16 1142  BP: (!) 153/73 (!) 149/70 (!) 150/74 139/66  Pulse:   60   Resp:   18 18  Temp:   97.4 F (36.3 C) 98.2 F (36.8 C)  TempSrc:   Oral Oral  SpO2:    99%  Weight:      Height:        Intake/Output Summary (Last 24 hours) at 05/21/16 1401 Last data filed at 05/21/16 1038  Gross per 24 hour  Intake             5080 ml  Output             1975 ml  Net             3105 ml   Filed Weights   05/16/16 0932 05/16/16 1817  Weight: 68 kg (150 lb) 67 kg (147 lb 11.3 oz)    Examination: Constitutional: NAD Vitals:   05/21/16 1023 05/21/16 1034 05/21/16 1111 05/21/16 1142  BP: (!) 153/73 (!) 149/70 (!) 150/74 139/66  Pulse:   60   Resp:   18 18  Temp:   97.4 F (36.3 C) 98.2 F (36.8 C)  TempSrc:   Oral Oral  SpO2:    99%  Weight:      Height:       Eyes: lids and conjunctivae normal Respiratory: clear to auscultation bilaterally, no wheezing, no crackles. Normal respiratory effort. No accessory muscle  use.  Cardiovascular: Regular rate and rhythm, no murmurs / rubs / gallops. No LE edema. 2+ pedal pulses Abdomen: no tenderness. Bowel sounds positive.  Neurologic: non focal. Ambulatory   Data Reviewed: I have personally reviewed following labs and imaging studies  CBC:  Recent Labs Lab 05/16/16 0941 05/17/16 0542 05/18/16 0604  WBC 7.5 4.5 7.2  HGB 12.4* 10.4* 11.8*  HCT 36.0* 30.2* 35.5*  MCV 92.5 93.5 94.7  PLT 219 181 416   Basic Metabolic Panel:  Recent Labs Lab 05/16/16 0941 05/17/16 0542 05/18/16 0604 05/19/16 0609 05/21/16 0647  NA 136 139 138 137 138  K 3.5 3.1* 3.7 4.1 3.8  CL 103 107 105 106 106  CO2 25 27 24 24 24   GLUCOSE 130* 96 99 103* 108*  BUN 21* 15 14 12 17   CREATININE 0.94 0.82 0.79 0.89 0.99  CALCIUM 10.4* 9.3 9.9 9.6 9.8   GFR: Estimated Creatinine Clearance: 45.1 mL/min (by C-G formula based on SCr of 0.99 mg/dL). Liver Function Tests:  Recent Labs Lab 05/17/16 0542 05/18/16 0604 05/19/16 0609 05/20/16 0650 05/21/16 0647  AST 105* 69* 268* 116* 65*  ALT 116* 99* 262* 160* 124*  ALKPHOS 101 102 164* 129* 133*  BILITOT 1.1 1.6* 2.2* 1.6* 1.2  PROT 6.3* 7.5 6.7 6.1* 7.0  ALBUMIN 3.9 4.5 4.1 3.9 4.3    Recent Labs Lab 05/16/16 0941  LIPASE 50   No results for input(s): AMMONIA in the last 168 hours. Coagulation Profile:  Recent Labs Lab 05/16/16 0941 05/17/16 0542 05/18/16 0604  INR 2.22 1.95 1.28   Cardiac Enzymes: No results for input(s): CKTOTAL,  CKMB, CKMBINDEX, TROPONINI in the last 168 hours. BNP (last 3 results) No results for input(s): PROBNP in the last 8760 hours. HbA1C: No results for input(s): HGBA1C in the last 72 hours. CBG: No results for input(s): GLUCAP in the last 168 hours. Lipid Profile: No results for input(s): CHOL, HDL, LDLCALC, TRIG, CHOLHDL, LDLDIRECT in the last 72 hours. Thyroid Function Tests: No results for input(s): TSH, T4TOTAL, FREET4, T3FREE, THYROIDAB in the last 72 hours. Anemia  Panel: No results for input(s): VITAMINB12, FOLATE, FERRITIN, TIBC, IRON, RETICCTPCT in the last 72 hours. Urine analysis:    Component Value Date/Time   COLORURINE YELLOW 05/16/2016 1051   APPEARANCEUR CLEAR 05/16/2016 1051   LABSPEC 1.011 05/16/2016 1051   PHURINE 7.0 05/16/2016 1051   GLUCOSEU NEGATIVE 05/16/2016 1051   HGBUR NEGATIVE 05/16/2016 1051   BILIRUBINUR NEGATIVE 05/16/2016 1051   KETONESUR 5 (A) 05/16/2016 1051   PROTEINUR 100 (A) 05/16/2016 1051   NITRITE NEGATIVE 05/16/2016 1051   LEUKOCYTESUR NEGATIVE 05/16/2016 1051   Sepsis Labs: Invalid input(s): PROCALCITONIN, LACTICIDVEN  Recent Results (from the past 240 hour(s))  Surgical pcr screen     Status: None   Collection Time: 05/18/16  5:49 AM  Result Value Ref Range Status   MRSA, PCR NEGATIVE NEGATIVE Final   Staphylococcus aureus NEGATIVE NEGATIVE Final    Comment:        The Xpert SA Assay (FDA approved for NASAL specimens in patients over 81 years of age), is one component of a comprehensive surveillance program.  Test performance has been validated by [facility] for patients greater than or equal to 62 year old. It is not intended to diagnose infection nor to guide or monitor treatment.       Radiology Studies: Dg Ercp With Sphincterotomy  Result Date: 05/21/2016 CLINICAL DATA:  83 year old male with a history of ERCP. EXAM: ERCP TECHNIQUE: Multiple spot images obtained with the fluoroscopic device and submitted for interpretation post-procedure. FLUOROSCOPY TIME:  Fluoroscopy Time:  2 minutes 7 seconds COMPARISON:  CT 05/19/2016 FINDINGS: Limited intraoperative fluoroscopic spot images during ERCP. Initial image demonstrates endoscope projecting over the upper abdomen. There is cannulation of the ampulla with safety wire in position. Retrograde infusion of contrast performed which partially opacifies the extrahepatic biliary system. Final image demonstrates minimal retained contrast within the  biliary system. Given the images, the sensitivity and specificity of this study for the detection of a cystic duct leak is reduced. IMPRESSION: Limited images during ERCP demonstrate partial opacification of the extrahepatic biliary system, with surgical changes of cholecystectomy, as above. Please refer to the dictated operative report for full details of intraoperative findings and procedure. Signed, Dulcy Fanny. Earleen Newport, DO [facility] Radiology Electronically Signed   By: Corrie Mckusick D.O.   On: 05/21/2016 10:20     Scheduled Meds: . amLODipine  10 mg Oral Q breakfast  . indomethacin  100 mg Rectal Once  . potassium chloride SA  40 mEq Oral Daily   Continuous Infusions:  Marzetta Board, MD, PhD [facility] Pager [phone]  If 7PM-7AM, please contact night-coverage [email or web address] Password [email or web address] 05/21/2016, 2:01 PM

## 2016-05-21 NOTE — H&P (View-Only) (Signed)
Patient ID: Manuel Lewis, male   DOB: [birth year], 83 y.o.   MRN: [id number]    Progress Note   Subjective   Doing well, in good spirits- has been up walking  No c/o abdominal pain    Objective   Vital signs in last 24 hours: Temp:  [98.1 F (36.7 C)-98.7 F (37.1 C)] 98.1 F (36.7 C) (03/06 0520) Pulse Rate:  [60-82] 72 (03/06 0520) Resp:  [18] 18 (03/06 0520) BP: (134-144)/(65-77) 134/75 (03/06 0520) SpO2:  [98 %-100 %] 100 % (03/06 0520) Last BM Date: 05/19/16 General:    Elderly WM  in NAD Heart:  Regular rate and rhythm; no murmurs Lungs: Respirations even and unlabored, lungs CTA bilaterally Abdomen:  Soft, nontender and nondistended. Normal bowel sounds. Extremities:  Without edema. Neurologic:  Alert and oriented,  grossly normal neurologically. Psych:  Cooperative. Normal mood and affect.  Intake/Output from previous day: 03/05 0701 - 03/06 0700 In: 53 [P.O.:780] Out: 700 [Urine:700] Intake/Output this shift: Total I/O In: 240 [P.O.:240] Out: 150 [Urine:150]  Lab Results:  Recent Labs  05/18/16 0604  WBC 7.2  HGB 11.8*  HCT 35.5*  PLT 223   BMET  Recent Labs  05/18/16 0604 05/19/16 0609  NA 138 137  K 3.7 4.1  CL 105 106  CO2 24 24  GLUCOSE 99 103*  BUN 14 12  CREATININE 0.79 0.89  CALCIUM 9.9 9.6   LFT  Recent Labs  05/20/16 0650  PROT 6.1*  ALBUMIN 3.9  AST 116*  ALT 160*  ALKPHOS 129*  BILITOT 1.6*  BILIDIR 0.5  IBILI 1.1*   PT/INR  Recent Labs  05/18/16 0604  LABPROT 16.1*  INR 1.28    Studies/Results: Ct Abdomen Pelvis W Wo Contrast  Result Date: 05/19/2016 CLINICAL DATA:  Status post laparoscopic cholecystectomy, CBD obstruction on intraoperative cholangiogram, possible CBD/pancreatic mass EXAM: CT ABDOMEN AND PELVIS WITHOUT AND WITH CONTRAST TECHNIQUE: Multidetector CT imaging of the abdomen and pelvis was performed following the standard protocol before and following the bolus administration of intravenous  contrast. CONTRAST:  178mL ISOVUE-300 IOPAMIDOL (ISOVUE-300) INJECTION 61% COMPARISON:  Intraoperative cholangiogram dated 05/18/2016. Right upper quadrant ultrasound dated 05/16/2016. FINDINGS: Lower chest: Mild patchy left lower lobe opacity (series 3/image 1), possibly reflecting aspiration or pneumonia. Small bilateral pleural effusions, right greater than left. Associated mild left basilar atelectasis. Hepatobiliary: Liver is grossly unremarkable. Calcified granuloma in segment 8 (series 4/ image 23). Status post cholecystectomy. Mild fluid in the gallbladder fossa (series 13/image 44), postsurgical. No intrahepatic duct dilatation. Dilated common duct, measuring up to 14 mm at its midportion (series 13/ image 44), smoothly tapering at the ampulla. No obstructing mass is seen. Pancreas: Within normal limits. No mass along the pancreatic head. No dilated pancreatic duct or atrophy along the pancreatic body/tail. Spleen: Within normal limits. Adrenals/Urinary Tract: Adrenal glands are within normal limits. Small right renal cysts measuring up to 1.1 cm (series 7/image 62). 7 mm left lower pole renal cyst (series 7/ image 62). No hydronephrosis. Bladder is mildly thick-walled/trabeculated, suggesting chronic bladder outlet obstruction. Nodular thickening along the left posterior bladder wall is favored to reflect enlargement of the central gland of the prostate rather than an irregular bladder wall. Stomach/Bowel: Stomach is within normal limits. No evidence of bowel obstruction. Prior appendectomy. Left colon is decompressed. Vascular/Lymphatic: No evidence of abdominal aortic aneurysm. Atherosclerotic calcifications of the abdominal aorta and branch vessels. No suspicious abdominopelvic lymphadenopathy. Reproductive: Brachytherapy seeds in the prostate. Enlargement/ nodularity of the central  gland, which indents the left posterior bladder (series 7/image 114). Tumor is not excluded. Other: No abdominopelvic  ascites. Scattered foci of free air, postsurgical. Additional postsurgical changes/gas along the right anterior abdominal wall. Moderate fat containing right inguinal hernia with fluid/stranding. Musculoskeletal: Degenerative changes of the visualized thoracolumbar spine. IMPRESSION: Status post cholecystectomy with associated postsurgical changes. Dilated common duct, measuring 14 mm at its midportion. No obstructing mass is evident on CT. Mild patchy left lower lobe opacity, possibly reflecting aspiration or pneumonia. Small bilateral pleural effusions with associated mild bibasilar atelectasis. Brachytherapy seeds in the prostate. Suspected BPH indenting the left posterior base of the bladder, tumor not excluded. Additional ancillary findings as above. Electronically Signed   By: Julian Hy M.D.   On: 05/19/2016 10:15   Dg Cholangiogram Operative  Result Date: 05/18/2016 CLINICAL DATA:  Cholecystitis.  Status post cholecystectomy. EXAM: INTRAOPERATIVE CHOLANGIOGRAM TECHNIQUE: Cholangiographic images from the C-arm fluoroscopic device were submitted for interpretation post-operatively. Please see the procedural report for the amount of contrast and the fluoroscopy time utilized. COMPARISON:  Ultrasound 05/16/2016 FINDINGS: Four runs are submitted for interpretation, demonstrating opacification of a dilated common bile duct and intrahepatic radicles. There is apparent tapering of the common bile duct and failure of the midportion of the duct to opacified, raising question of mid common bile duct stricture. The distal portion of the duct fills faintly with contrast and also appears dilated. Contrast is identified within the duodenum. IMPRESSION: Possible stricture of the mid common bile duct. Further evaluation with MRCP is recommended. The study is reviewed with Dr. Lucia Gaskins. Electronically Signed   By: Nolon Nations M.D.   On: 05/18/2016 13:13       Assessment / Plan:    #1 83 yo WM s/p lap chole  on 05/18/2016 - abnormal IOC with question of mid CBD stricture, elevated LFT's and CT shows dilated  CBD at 14 mm.,  no stone or obstruction  Noted. Unable to do MRCP as pt has pacer  R/O choledocholithiasis, CBD stricture  LFT's  are trending down   #2 atrial fib- coumadin on hold- INR 1.28  05/18/16 #3 s/p pacer  #4 hx prostate CA  Plan;  Plan is For ERCP with Dr Fuller Plan at 8:30 am tomorrow  NPO after midnight Unasyn pre-procedure Repeat labs in am   Active Problems:   Acute cholecystitis   Complication of anesthesia   Gallstones   Elevated liver function tests     LOS: 4 days   Blakley Michna  05/20/2016, 9:57 AM

## 2016-05-21 NOTE — Anesthesia Postprocedure Evaluation (Signed)
Anesthesia Post Note  Patient: Manuel Lewis  Procedure(s) Performed: Procedure(s) (LRB): ENDOSCOPIC RETROGRADE CHOLANGIOPANCREATOGRAPHY (ERCP) (N/A)  Patient location during evaluation: PACU Anesthesia Type: General Level of consciousness: sedated and patient cooperative Pain management: pain level controlled Vital Signs Assessment: post-procedure vital signs reviewed and stable Respiratory status: spontaneous breathing Cardiovascular status: stable Anesthetic complications: no       Last Vitals:  Vitals:   05/21/16 0953 05/21/16 1012  BP: (!) 142/72 (!) 150/70  Pulse:    Resp:    Temp:      Last Pain:  Vitals:   05/21/16 0940  TempSrc: Oral  PainSc:                  Nolon Nations

## 2016-05-22 ENCOUNTER — Encounter (HOSPITAL_COMMUNITY): Payer: Self-pay | Admitting: Gastroenterology

## 2016-05-22 MED ORDER — HYDROCODONE-ACETAMINOPHEN 5-325 MG PO TABS: 1.0000 | ORAL_TABLET | Freq: Four times a day (QID) | ORAL | 0 refills | Status: DC | PRN

## 2016-05-22 NOTE — Progress Notes (Signed)
1 Day Post-Op  Subjective: No complaints. Tolerating diet. Sounds like no tumor seen on ERCP  Objective: Vital signs in last 24 hours: Temp:  [97.4 F (36.3 C)-98.4 F (36.9 C)] 97.9 F (36.6 C) (03/08 0455) Pulse Rate:  [60-99] 99 (03/08 0455) Resp:  [18] 18 (03/08 0455) BP: (124-153)/(63-85) 136/85 (03/08 0455) SpO2:  [97 %-99 %] 97 % (03/08 0455) Last BM Date: 05/19/16  Intake/Output from previous day: 03/07 0701 - 03/08 0700 In: 1970 [P.O.:1020; I.V.:950] Out: 1000 [Urine:1000] Intake/Output this shift: No intake/output data recorded.  Resp: clear to auscultation bilaterally Cardio: regular rate and rhythm GI: soft, nontender  Lab Results:  No results for input(s): WBC, HGB, HCT, PLT in the last 72 hours. BMET  Recent Labs  05/21/16 0647  NA 138  K 3.8  CL 106  CO2 24  GLUCOSE 108*  BUN 17  CREATININE 0.99  CALCIUM 9.8   PT/INR No results for input(s): LABPROT, INR in the last 72 hours. ABG No results for input(s): PHART, HCO3 in the last 72 hours.  Invalid input(s): PCO2, PO2  Studies/Results: Dg Ercp With Sphincterotomy  Result Date: 05/21/2016 CLINICAL DATA:  83 year old male with a history of ERCP. EXAM: ERCP TECHNIQUE: Multiple spot images obtained with the fluoroscopic device and submitted for interpretation post-procedure. FLUOROSCOPY TIME:  Fluoroscopy Time:  2 minutes 7 seconds COMPARISON:  CT 05/19/2016 FINDINGS: Limited intraoperative fluoroscopic spot images during ERCP. Initial image demonstrates endoscope projecting over the upper abdomen. There is cannulation of the ampulla with safety wire in position. Retrograde infusion of contrast performed which partially opacifies the extrahepatic biliary system. Final image demonstrates minimal retained contrast within the biliary system. Given the images, the sensitivity and specificity of this study for the detection of a cystic duct leak is reduced. IMPRESSION: Limited images during ERCP demonstrate  partial opacification of the extrahepatic biliary system, with surgical changes of cholecystectomy, as above. Please refer to the dictated operative report for full details of intraoperative findings and procedure. Signed, Dulcy Fanny. Earleen Newport, DO [facility] Radiology Electronically Signed   By: Corrie Mckusick D.O.   On: 05/21/2016 10:20    Anti-infectives: Anti-infectives    Start     Dose/Rate Route Frequency Ordered Stop   05/21/16 0630  ampicillin-sulbactam (UNASYN) 1.5 g in sodium chloride 0.9 % 50 mL IVPB     1.5 g 100 mL/hr over 30 Minutes Intravenous  Once 05/19/16 1550 05/21/16 0638   05/18/16 1900  ceFAZolin (ANCEF) IVPB 1 g/50 mL premix     1 g 100 mL/hr over 30 Minutes Intravenous Every 8 hours 05/18/16 1356 05/19/16 2159      Assessment/Plan: s/p Procedure(s): ENDOSCOPIC RETROGRADE CHOLANGIOPANCREATOGRAPHY (ERCP) (N/A) Advance diet Discharge  F/U with Dr. Lucia Gaskins in 2 weeks  LOS: 6 days    TOTH III,Kaimen Peine S 05/22/2016

## 2016-05-22 NOTE — Discharge Summary (Signed)
Physician Discharge Summary  Manuel Lewis XKG:[id number] DOB: 08-20-1921 DOA: 05/16/2016  PCP: Manuel Shelling, MD  Admit date: 05/16/2016 Discharge date: 05/22/2016  Admitted From: home Disposition:  home  Recommendations for Outpatient Follow-up:  1. Follow up with PCP in 1-2 weeks 2. Please obtain BMP/CBC in one week 3. Please follow up on the following pending results:  Home Health: None Equipment/Devices: None  Discharge Condition: Stable CODE STATUS: Full code Diet recommendation: Regular  HPI: Per Manuel Lewis,  83 year old male Atrial fibrillation [place] score 3 Single chamber Medtronic pacemaker placed 07/2009-followed by Manuel Lewis Hypertension Prostate cancer with seed implant patient 1998 History of urethral stricture dilatation 02/2016 [facility] [place]-urologist is Manuel Lewis Macular degeneration Status post left inguinal hernia repair 08/2011  Admitting 05/16/2016 secondary to moderate to severe abdominal pain and 10 PM 05/15/16 no relation to food. Some nausea. No vomiting. No dark stool. No pars 2. No fever. No chills. No melena. No rash. No falls. Central epigastric radiating to the back, 8/10-relieved by morphine. Did not try any medication at home. Became more intolerable at 0 400 05/16/2016 and decided to come to the emergency room  In the emergency room received morphine 4 mg kept nothing by mouth and general surgery consulted  Bun /creat 21/0.94 AST/ALT-265/159 no WBC Abd [place] multiples  Hospital Course: Discharge Diagnoses:  Active Problems:   Acute cholecystitis   Complication of anesthesia   Gallstones   Elevated LFTs   Common bile duct (CBD) obstruction   Abnormal cholangiogram  Cholecystitis vs passed CBD stone -General surgery and gastroenterology were consulted,   Out of concern for cholecystitis general surgery to patient to the operating room on 3/4 and he is status post cholecystectomy.  He also underwent an intraoperative  cholangiogram which showed an impression of CBD narrowing.  Gastroenterology was consulted.  Patient underwent an ERCP on 3/7, without any significant findings.  Following cholecystectomy and ERCP, patient improved clinically, return to baseline, was able to tolerate a regular diet and was discharged home in stable condition with close outpatient follow-up. LFTs are improving, bilirubin has now normalized. Permanent atrial fibrillation  -Mali score 4, bradycardia status post pacemaker 2011, stable not requiring any antiarrhythmic. Per GI, okay to resume Coumadin in 2 days following the ERCP, will advise patient to resume on 3/9 Prostate Ca s/p Radiation seeds 1998 -follow with Urology at [facility] yearly Obstructive uropathy s/p urethral dilatation -currently stable Stable hypertension -resume home medications Prior macular degeneration  -continue Ocuvite vitamins   Discharge Instructions   Allergies as of 05/22/2016   No Known Allergies     Medication List    TAKE these medications   amLODipine 10 MG tablet Commonly known as:  NORVASC Take 10 mg by mouth daily with breakfast.   beta carotene w/minerals tablet Take 1 tablet by mouth daily with breakfast.   calcium-vitamin D 500-200 MG-UNIT tablet Commonly known as:  OSCAL WITH D Take 1 tablet by mouth at bedtime.   hydrochlorothiazide 12.5 MG capsule Commonly known as:  MICROZIDE Take 12.5 mg by mouth daily with breakfast.   HYDROcodone-acetaminophen 5-325 MG tablet Commonly known as:  NORCO/VICODIN Take 1-2 tablets by mouth every 6 (six) hours as needed for moderate pain.   losartan 100 MG tablet Commonly known as:  COZAAR Take 100 mg by mouth daily with breakfast.   warfarin 2 MG tablet Commonly known as:  COUMADIN Take 1-2 mg by mouth every evening. Takes 1 mg everyday except on 2 mg  on Monday's      Follow-up Information    Manuel H, MD. Schedule an appointment as soon as possible for a visit in 3 week(s).     Specialty:  General Surgery Why:  We are working on your appointment, please call to confirm. Contact information: [place] [address] [phone]        Manuel Shelling, MD. Schedule an appointment as soon as possible for a visit in 2 week(s).   Specialty:  Internal Medicine Contact information: [address] [place] [address] [phone]          No Known Allergies  Consultations:  Gastroenterology  General surgery  Procedures/Studies:  Ct Abdomen Pelvis W Wo Contrast  Result Date: 05/19/2016 CLINICAL DATA:  Status post laparoscopic cholecystectomy, CBD obstruction on intraoperative cholangiogram, possible CBD/pancreatic mass EXAM: CT ABDOMEN AND PELVIS WITHOUT AND WITH CONTRAST TECHNIQUE: Multidetector CT imaging of the abdomen and pelvis was performed following the standard protocol before and following the bolus administration of intravenous contrast. CONTRAST:  173mL ISOVUE-300 IOPAMIDOL (ISOVUE-300) INJECTION 61% COMPARISON:  Intraoperative cholangiogram dated 05/18/2016. Right upper quadrant ultrasound dated 05/16/2016. FINDINGS: Lower chest: Mild patchy left lower lobe opacity (series 3/image 1), possibly reflecting aspiration or pneumonia. Small bilateral pleural effusions, right greater than left. Associated mild left basilar atelectasis. Hepatobiliary: Liver is grossly unremarkable. Calcified granuloma in segment 8 (series 4/ image 23). Status post cholecystectomy. Mild fluid in the gallbladder fossa (series 13/image 44), postsurgical. No intrahepatic duct dilatation. Dilated common duct, measuring up to 14 mm at its midportion (series 13/ image 44), smoothly tapering at the ampulla. No obstructing mass is seen. Pancreas: Within normal limits. No mass along the pancreatic head. No dilated pancreatic duct or atrophy along the pancreatic body/tail. Spleen: Within normal limits. Adrenals/Urinary Tract: Adrenal glands are  within normal limits. Small right renal cysts measuring up to 1.1 cm (series 7/image 62). 7 mm left lower pole renal cyst (series 7/ image 62). No hydronephrosis. Bladder is mildly thick-walled/trabeculated, suggesting chronic bladder outlet obstruction. Nodular thickening along the left posterior bladder wall is favored to reflect enlargement of the central gland of the prostate rather than an irregular bladder wall. Stomach/Bowel: Stomach is within normal limits. No evidence of bowel obstruction. Prior appendectomy. Left colon is decompressed. Vascular/Lymphatic: No evidence of abdominal aortic aneurysm. Atherosclerotic calcifications of the abdominal aorta and branch vessels. No suspicious abdominopelvic lymphadenopathy. Reproductive: Brachytherapy seeds in the prostate. Enlargement/ nodularity of the central gland, which indents the left posterior bladder (series 7/image 114). Tumor is not excluded. Other: No abdominopelvic ascites. Scattered foci of free air, postsurgical. Additional postsurgical changes/gas along the right anterior abdominal wall. Moderate fat containing right inguinal hernia with fluid/stranding. Musculoskeletal: Degenerative changes of the visualized thoracolumbar spine. IMPRESSION: Status post cholecystectomy with associated postsurgical changes. Dilated common duct, measuring 14 mm at its midportion. No obstructing mass is evident on CT. Mild patchy left lower lobe opacity, possibly reflecting aspiration or pneumonia. Small bilateral pleural effusions with associated mild bibasilar atelectasis. Brachytherapy seeds in the prostate. Suspected BPH indenting the left posterior base of the bladder, tumor not excluded. Additional ancillary findings as above. Electronically Signed   By: Julian Hy M.D.   On: 05/19/2016 10:15   Dg Chest 2 View  Result Date: 05/16/2016 CLINICAL DATA:  Upper abdominal pain EXAM: CHEST  2 VIEW COMPARISON:  08/14/2011 FINDINGS: Single lead pacer lead in the  right ventricle unchanged. Heart size upper normal. Negative for heart  failure. Lungs are clear without infiltrate effusion or mass. IMPRESSION: No active cardiopulmonary disease. Electronically Signed   By: Franchot Gallo M.D.   On: 05/16/2016 10:00   Dg Cholangiogram Operative  Result Date: 05/18/2016 CLINICAL DATA:  Cholecystitis.  Status post cholecystectomy. EXAM: INTRAOPERATIVE CHOLANGIOGRAM TECHNIQUE: Cholangiographic images from the C-arm fluoroscopic device were submitted for interpretation post-operatively. Please see the procedural report for the amount of contrast and the fluoroscopy time utilized. COMPARISON:  Ultrasound 05/16/2016 FINDINGS: Four runs are submitted for interpretation, demonstrating opacification of a dilated common bile duct and intrahepatic radicles. There is apparent tapering of the common bile duct and failure of the midportion of the duct to opacified, raising question of mid common bile duct stricture. The distal portion of the duct fills faintly with contrast and also appears dilated. Contrast is identified within the duodenum. IMPRESSION: Possible stricture of the mid common bile duct. Further evaluation with MRCP is recommended. The study is reviewed with Dr. Lucia Gaskins. Electronically Signed   By: Nolon Nations M.D.   On: 05/18/2016 13:13   US Abdomen Limited  Result Date: 05/16/2016 CLINICAL DATA:  Upper abdominal pain EXAM: US ABDOMEN LIMITED - RIGHT UPPER QUADRANT COMPARISON:  None. FINDINGS: Gallbladder: Within the gallbladder, there are multiple echogenic foci which move and shadow consistent with gallstones. Largest individual gallstone measures 2.2 cm in length. Gallbladder wall is thickened and edematous. No pericholecystic fluid. No sonographic Murphy sign noted by sonographer. Common bile duct: Diameter: 3 mm proximally. Most of the common bile duct is obscured by gas. Liver: No focal lesion identified. Within normal limits in parenchymal echogenicity.  IMPRESSION: Gallstones with gallbladder wall thickening. The appearance is concerning for acute cholecystitis. Only a small portion of the common bile duct is visualized; the small portion appears normal. No evident liver lesions. Electronically Signed   By: Lowella Grip III M.D.   On: 05/16/2016 12:43   Dg Ercp With Sphincterotomy  Result Date: 05/21/2016 CLINICAL DATA:  83 year old male with a history of ERCP. EXAM: ERCP TECHNIQUE: Multiple spot images obtained with the fluoroscopic device and submitted for interpretation post-procedure. FLUOROSCOPY TIME:  Fluoroscopy Time:  2 minutes 7 seconds COMPARISON:  CT 05/19/2016 FINDINGS: Limited intraoperative fluoroscopic spot images during ERCP. Initial image demonstrates endoscope projecting over the upper abdomen. There is cannulation of the ampulla with safety wire in position. Retrograde infusion of contrast performed which partially opacifies the extrahepatic biliary system. Final image demonstrates minimal retained contrast within the biliary system. Given the images, the sensitivity and specificity of this study for the detection of a cystic duct leak is reduced. IMPRESSION: Limited images during ERCP demonstrate partial opacification of the extrahepatic biliary system, with surgical changes of cholecystectomy, as above. Please refer to the dictated operative report for full details of intraoperative findings and procedure. Signed, Dulcy Fanny. Earleen Newport, DO [facility] Electronically Signed   By: Corrie Mckusick D.O.   On: 05/21/2016 10:20     Subjective: - no chest pain, shortness of breath, no abdominal pain, nausea or vomiting.  Feels great, wants to go home  Discharge Exam: Vitals:   05/21/16 2100 05/22/16 0455  BP: 124/72 136/85  Pulse: 68 99  Resp: 18 18  Temp: 98.4 F (36.9 C) 97.9 F (36.6 C)   Vitals:   05/21/16 1111 05/21/16 1142 05/21/16 2100 05/22/16 0455  BP: (!) 150/74  139/66 124/72 136/85  Pulse: 60  68 99  Resp: 18 18 18 18   Temp: 97.4 F (  36.3 C) 98.2 F (36.8 C) 98.4 F (36.9 C) 97.9 F (36.6 C)  TempSrc: Oral Oral Oral Oral  SpO2:  99% 98% 97%  Weight:      Height:        General: Pt is alert, awake, not in acute distress Cardiovascular: RRR, S1/S2 +, no rubs, no gallops Respiratory: CTA bilaterally, no wheezing, no rhonchi   The results of significant diagnostics from this hospitalization (including imaging, microbiology, ancillary and laboratory) are listed below for reference.     Microbiology: Recent Results (from the past 240 hour(s))  Surgical pcr screen     Status: None   Collection Time: 05/18/16  5:49 AM  Result Value Ref Range Status   MRSA, PCR NEGATIVE NEGATIVE Final   Staphylococcus aureus NEGATIVE NEGATIVE Final    Comment:        The Xpert SA Assay (FDA approved for NASAL specimens in patients over 72 years of age), is one component of a comprehensive surveillance program.  Test performance has been validated by [facility] for patients greater than or equal to 67 year old. It is not intended to diagnose infection nor to guide or monitor treatment.      Labs: BNP (last 3 results) No results for input(s): BNP in the last 8760 hours. Basic Metabolic Panel:  Recent Labs Lab 05/16/16 0941 05/17/16 0542 05/18/16 0604 05/19/16 0609 05/21/16 0647  NA 136 139 138 137 138  K 3.5 3.1* 3.7 4.1 3.8  CL 103 107 105 106 106  CO2 25 27 24 24 24   GLUCOSE 130* 96 99 103* 108*  BUN 21* 15 14 12 17   CREATININE 0.94 0.82 0.79 0.89 0.99  CALCIUM 10.4* 9.3 9.9 9.6 9.8   Liver Function Tests:  Recent Labs Lab 05/17/16 0542 05/18/16 0604 05/19/16 0609 05/20/16 0650 05/21/16 0647  AST 105* 69* 268* 116* 65*  ALT 116* 99* 262* 160* 124*  ALKPHOS 101 102 164* 129* 133*  BILITOT 1.1 1.6* 2.2* 1.6* 1.2  PROT 6.3* 7.5 6.7 6.1* 7.0  ALBUMIN 3.9 4.5 4.1 3.9 4.3    Recent Labs Lab 05/16/16 0941  LIPASE 50    No results for input(s): AMMONIA in the last 168 hours. CBC:  Recent Labs Lab 05/16/16 0941 05/17/16 0542 05/18/16 0604  WBC 7.5 4.5 7.2  HGB 12.4* 10.4* 11.8*  HCT 36.0* 30.2* 35.5*  MCV 92.5 93.5 94.7  PLT 219 181 223   Urinalysis    Component Value Date/Time   COLORURINE YELLOW 05/16/2016 1051   APPEARANCEUR CLEAR 05/16/2016 1051   LABSPEC 1.011 05/16/2016 1051   PHURINE 7.0 05/16/2016 1051   GLUCOSEU NEGATIVE 05/16/2016 1051   HGBUR NEGATIVE 05/16/2016 1051   BILIRUBINUR NEGATIVE 05/16/2016 1051   KETONESUR 5 (A) 05/16/2016 1051   PROTEINUR 100 (A) 05/16/2016 1051   NITRITE NEGATIVE 05/16/2016 1051   LEUKOCYTESUR NEGATIVE 05/16/2016 1051   Sepsis Labs Invalid input(s): PROCALCITONIN,  WBC,  LACTICIDVEN Microbiology Recent Results (from the past 240 hour(s))  Surgical pcr screen     Status: None   Collection Time: 05/18/16  5:49 AM  Result Value Ref Range Status   MRSA, PCR NEGATIVE NEGATIVE Final   Staphylococcus aureus NEGATIVE NEGATIVE Final    Comment:        The Xpert SA Assay (FDA approved for NASAL specimens in patients over 57 years of age), is one component of a comprehensive surveillance program.  Test performance has been validated by [facility] for patients greater than or  equal to 48 year old. It is not intended to diagnose infection nor to guide or monitor treatment.     Time coordinating discharge: 35 minutes  SIGNED:  Marzetta Board, MD  [facility] 05/22/2016, 12:46 PM Pager [phone]  If 7PM-7AM, please contact night-coverage [email or web address] Password [email or web address]

## 2016-08-16 NOTE — Addendum Note (Signed)
Addendum  created 08/16/16 1046 by Duane Boston, MD   Sign clinical note

## 2016-11-04 ENCOUNTER — Telehealth: Payer: Self-pay | Admitting: Internal Medicine

## 2016-11-04 NOTE — Telephone Encounter (Signed)
New message     Pt received letter from [facility] and they want to know what is going on they dont know who medtron[employer] is

## 2016-11-04 NOTE — Telephone Encounter (Signed)
[employer] letter regarding analog Carelink monitoring. I let Mrs. Janney know that she can disregard the letter since they do not use remote monitor. Confirmed appt with Dr. Caryl Comes 01/30/17.

## 2017-01-16 ENCOUNTER — Encounter: Payer: Self-pay | Admitting: Internal Medicine

## 2017-01-30 ENCOUNTER — Ambulatory Visit: Payer: Medicare Other | Admitting: Internal Medicine

## 2017-01-30 ENCOUNTER — Encounter: Payer: Self-pay | Admitting: Internal Medicine

## 2017-01-30 ENCOUNTER — Encounter (INDEPENDENT_AMBULATORY_CARE_PROVIDER_SITE_OTHER): Payer: Self-pay

## 2017-01-30 VITALS — BP 126/60 | HR 80 | Ht 69.0 in | Wt 151.0 lb

## 2017-01-30 DIAGNOSIS — I482 Chronic atrial fibrillation, unspecified: Secondary | ICD-10-CM

## 2017-01-30 DIAGNOSIS — Z950 Presence of cardiac pacemaker: Secondary | ICD-10-CM

## 2017-01-30 DIAGNOSIS — R001 Bradycardia, unspecified: Secondary | ICD-10-CM

## 2017-01-30 LAB — CUP PACEART INCLINIC DEVICE CHECK
Battery Remaining Longevity: 18 mo
Battery Voltage: 2.72 V
Brady Statistic RV Percent Paced: 100 %
Implantable Lead Location: 753860
Implantable Lead Model: 4471
Lead Channel Impedance Value: 0 Ohm
Lead Channel Setting Pacing Amplitude: 2.5 V
Lead Channel Setting Sensing Sensitivity: 2.8 mV
MDC IDC LEAD IMPLANT DT: 20110531
MDC IDC LEAD SERIAL: 484940
MDC IDC MSMT BATTERY IMPEDANCE: 3387 Ohm
MDC IDC MSMT LEADCHNL RV IMPEDANCE VALUE: 461 Ohm
MDC IDC MSMT LEADCHNL RV PACING THRESHOLD AMPLITUDE: 0.75 V
MDC IDC MSMT LEADCHNL RV PACING THRESHOLD PULSEWIDTH: 0.4 ms
MDC IDC MSMT LEADCHNL RV SENSING INTR AMPL: 11.2 mV
MDC IDC PG IMPLANT DT: 20110531
MDC IDC SESS DTM: 20181116171637
MDC IDC SET LEADCHNL RV PACING PULSEWIDTH: 0.4 ms

## 2017-01-30 NOTE — Patient Instructions (Addendum)
Your physician recommends that you continue on your current medications as directed. Please refer to the Current Medication list given to you today.     Your physician wants you to follow-up in: REMOTE DEVICE  CHECK  05/04/17 AND YEAR WITH DR Gari Crown will receive a reminder letter in the mail two months in advance. If you don't receive a letter, please call our office to schedule the follow-up appointment.

## 2017-01-30 NOTE — Progress Notes (Signed)
Patient Care Team: [name], MD as PCP - General (Internal Medicine)   HPI  Manuel Lewis is a 83 y.o. male Seen to followup for  pacemaker care for single-chamber device implanted in May 2011 for bradycardia in the setting of permanent atrial fibrillation. Thromboembolic risk factors are notable for age-13 and hypertension for a CHADS VASC score of 3    The patient denies chest pain, shortness of breath, nocturnal dyspnea, orthopnea  Specific question is unable to uncover exercise limitation  There have been no palpitations, lightheadedness or syncope.   He does have some peripheral edema.  He takes amlodipine.  He is anticipated hernia surgery.  He is able to climb stairs.  Carry groceries.   Date Cr Hgb  3/18 0.82 11.8           Past Medical History:  Diagnosis Date  . Chronic a-fib ([place])    since early 1990's with profound bradycardia and pauses; s/p  implantation  of Medtronic Adapta [id number], serial # [id number]  . Complication of anesthesia 1998   bradycardia  . Dysrhythmia   . Hypertension 1990  . Inguinal hernia   . Macular degeneration   . Nocturia   . Pacemaker   . Prostate cancer ([place]) 1998   with seed implantation    Past Surgical History:  Procedure Laterality Date  . APPENDECTOMY  1941  . CARDIOVERSION     IN THE 1990's  . CATARACT EXTRACTION     2000 and 2007/ h/o macular degeneration since 2009  . ENDOSCOPIC RETROGRADE CHOLANGIOPANCREATOGRAPHY (ERCP) N/A 05/21/2016   Performed by Ladene Artist, MD at [place]  . HERNIA REPAIR     1945 and 1985  . HERNIA REPAIR  08/18/11   inguinal  . HERNIA REPAIR INGUINAL ADULT Left 08/18/2011   Performed by [name], MD at [place]  . INSERT / REPLACE / REMOVE PACEMAKER  08/14/09   IMPLANTATION OF SINGLE CHAMBER PULSE  GENERATING SYSTEM;   MODEL ADSR01, SERIAL # [id number] H ADAPTA  . INSERTION OF MESH Left 08/18/2011   Performed by [name], MD at [place]  . LAPAROSCOPIC CHOLECYSTECTOMY WITH  INTRAOPERATIVE CHOLANGIOGRAM N/A 05/18/2016   Performed by Alphonsa Overall, MD at [facility] ORS  . THYROIDECTOMY, PARTIAL  1960   benign nodule  . TONSILLECTOMY    . US ECHOCARDIOGRAPHY  07/26/2009   EF 55-60%    Current Outpatient Medications  Medication Sig Dispense Refill  . amLODipine (NORVASC) 10 MG tablet Take 10 mg by mouth daily with breakfast.     . beta carotene w/minerals (OCUVITE) tablet Take 1 tablet by mouth daily with breakfast.    . calcium-vitamin D (OSCAL WITH D) 500-200 MG-UNIT tablet Take 1 tablet by mouth at bedtime.    . hydrochlorothiazide (,MICROZIDE/HYDRODIURIL,) 12.5 MG capsule Take 12.5 mg by mouth daily with breakfast.     . HYDROcodone-acetaminophen (NORCO/VICODIN) 5-325 MG tablet Take 1-2 tablets by mouth every 6 (six) hours as needed for moderate pain. 30 tablet 0  . losartan (COZAAR) 100 MG tablet Take 100 mg by mouth daily with breakfast.     . warfarin (COUMADIN) 2 MG tablet Take 1-2 mg by mouth every evening. Takes 1 mg everyday except on 2 mg on Monday's     No current facility-administered medications for this visit.     No Known Allergies  Review of Systems negative except from HPI and PMH  Physical Exam BP 126/60   Pulse 80   Ht  5\' 9"  (1.753 m)   Wt 151 lb (68.5 kg)   SpO2 96%   BMI 22.30 kg/m  \ Well developed and nourished in no acute distress HENT normal Neck supple with JVP-flat Clear Device pocket well healed; without hematoma or erythema.  There is no tethering  Regular rate and rhythm, no murmurs or gallops Abd-soft with active BS No Clubbing cyanosis 1-2+edema Skin-warm and dry A & Oriented  Grossly normal sensory and motor function  ECG demonstrates atrial fibrillation with ventricular pacing at 71 -/18/46  Assessment and  Plan  Complete heart block stable post pacing  Atrial fibrillation-permanent   pacemaker-Medtronic The patient's device was interrogated.  The information was reviewed. No changes were made in the  programming.    Hypertension  Edema  Preop cardiac evaluation     Patient has permanent atrial fibrillation is on Coumadin.  No bleeding issues.  Blood pressures well controlled but I wonder whether the amlodipine is contributing to his edema.  Would ask Dr. Inda Merlin to review alternative therapies but I would hate to increase his diuretic for drug related side effect.  From a surgical point, his functional status is really quite good.  His surgical risk should be acceptable.

## 2017-02-11 ENCOUNTER — Telehealth: Payer: Self-pay

## 2017-02-11 NOTE — Telephone Encounter (Signed)
Dr. Laurann Montana called to speak to Dr. Burt Knack (DOD) to see whether or not the patient needs a bridge to come off Coumadin for an upcoming hernia repair.  He requests a call back at [phone] to discuss.

## 2017-02-11 NOTE — Telephone Encounter (Signed)
Thanks. Spoke with Dr Laurann Montana. Agree. Pt will hold warfarin without bridging.

## 2017-02-11 NOTE — Telephone Encounter (Signed)
Pt takes Coumadin for afib with CHADS2VASc score of 3 (HTN, age - 2). He should be safe to hold Coumadin for up to 5 days without requiring a Lovenox bridge.

## 2017-02-13 ENCOUNTER — Ambulatory Visit: Payer: Self-pay | Admitting: Surgery

## 2017-03-30 ENCOUNTER — Other Ambulatory Visit: Payer: Self-pay

## 2017-03-30 ENCOUNTER — Encounter (HOSPITAL_BASED_OUTPATIENT_CLINIC_OR_DEPARTMENT_OTHER): Payer: Self-pay | Admitting: *Deleted

## 2017-03-30 NOTE — Progress Notes (Signed)
Patient is on Coumadin for a-fib, pacemaker. His last dose was 03-27-17. He will come in for PT/INR, PTT, BMET.

## 2017-03-31 ENCOUNTER — Encounter (HOSPITAL_BASED_OUTPATIENT_CLINIC_OR_DEPARTMENT_OTHER)
Admission: RE | Admit: 2017-03-31 | Discharge: 2017-03-31 | Disposition: A | Discharge status: Home / Self Care | Payer: Medicare Other | Source: Ambulatory Visit | Attending: Surgery | Admitting: Surgery

## 2017-03-31 DIAGNOSIS — Z79899 Other long term (current) drug therapy: Secondary | ICD-10-CM | POA: Diagnosis not present

## 2017-03-31 DIAGNOSIS — K4091 Unilateral inguinal hernia, without obstruction or gangrene, recurrent: Secondary | ICD-10-CM | POA: Diagnosis not present

## 2017-03-31 DIAGNOSIS — I4891 Unspecified atrial fibrillation: Secondary | ICD-10-CM | POA: Diagnosis not present

## 2017-03-31 DIAGNOSIS — Z8546 Personal history of malignant neoplasm of prostate: Secondary | ICD-10-CM | POA: Diagnosis not present

## 2017-03-31 DIAGNOSIS — I10 Essential (primary) hypertension: Secondary | ICD-10-CM | POA: Diagnosis not present

## 2017-03-31 DIAGNOSIS — Z950 Presence of cardiac pacemaker: Secondary | ICD-10-CM | POA: Diagnosis not present

## 2017-03-31 DIAGNOSIS — Z7901 Long term (current) use of anticoagulants: Secondary | ICD-10-CM | POA: Diagnosis not present

## 2017-03-31 LAB — CBC WITH DIFFERENTIAL/PLATELET
BASOS PCT: 0 %
Basophils Absolute: 0 10*3/uL (ref 0.0–0.1)
EOS ABS: 0.3 10*3/uL (ref 0.0–0.7)
EOS PCT: 6 %
HCT: 38.6 % — ABNORMAL LOW (ref 39.0–52.0)
Hemoglobin: 12.9 g/dL — ABNORMAL LOW (ref 13.0–17.0)
Lymphocytes Relative: 26 %
Lymphs Abs: 1.3 10*3/uL (ref 0.7–4.0)
MCH: 31.8 pg (ref 26.0–34.0)
MCHC: 33.4 g/dL (ref 30.0–36.0)
MCV: 95.1 fL (ref 78.0–100.0)
MONO ABS: 0.5 10*3/uL (ref 0.1–1.0)
Monocytes Relative: 10 %
Neutro Abs: 3 10*3/uL (ref 1.7–7.7)
Neutrophils Relative %: 58 %
PLATELETS: 265 10*3/uL (ref 150–400)
RBC: 4.06 MIL/uL — ABNORMAL LOW (ref 4.22–5.81)
RDW: 12.9 % (ref 11.5–15.5)
WBC: 5.1 10*3/uL (ref 4.0–10.5)

## 2017-03-31 LAB — PROTIME-INR
INR: 1.58
PROTHROMBIN TIME: 18.8 s — AB (ref 11.4–15.2)

## 2017-03-31 LAB — APTT: aPTT: 36 seconds (ref 24–36)

## 2017-03-31 NOTE — Progress Notes (Signed)
Ensure pre surgery drink given with instructions to complete by 0400 dos, pt verbalized understanding. 

## 2017-04-01 LAB — BASIC METABOLIC PANEL
ANION GAP: 8 (ref 5–15)
BUN: 18 mg/dL (ref 6–20)
CHLORIDE: 108 mmol/L (ref 101–111)
CO2: 24 mmol/L (ref 22–32)
Calcium: 9.8 mg/dL (ref 8.9–10.3)
Creatinine, Ser: 1.04 mg/dL (ref 0.61–1.24)
GFR calc non Af Amer: 60 mL/min — ABNORMAL LOW (ref >60)
Glucose, Bld: 102 mg/dL — ABNORMAL HIGH (ref 65–99)
POTASSIUM: 3.9 mmol/L (ref 3.5–5.1)
SODIUM: 140 mmol/L (ref 135–145)

## 2017-04-01 NOTE — Progress Notes (Addendum)
Dr. Nydia Bouton requesting INR be drawn day of surgery.  Will have patient labs drawn upon arrival and sent stat to [facility].  Informed Dr. Conrad Waukena of 7:30 start and possible delay awaiting labs.

## 2017-04-01 NOTE — Anesthesia Preprocedure Evaluation (Signed)
Anesthesia Evaluation  Patient identified by MRN, date of birth, ID band Patient awake    Reviewed: Allergy & Precautions, NPO status , Patient's Chart, lab work & pertinent test results  History of Anesthesia Complications Negative for: history of anesthetic complications  Airway Mallampati: II  TM Distance: >3 FB Neck ROM: Full    Dental no notable dental hx. (+) Teeth Intact, Dental Advisory Given   Pulmonary neg pulmonary ROS,    Pulmonary exam normal breath sounds clear to auscultation       Cardiovascular hypertension, Pt. on medications Normal cardiovascular exam+ dysrhythmias Atrial Fibrillation + pacemaker  Rhythm:Regular Rate:Normal     Neuro/Psych negative neurological ROS  negative psych ROS   GI/Hepatic Neg liver ROS,   Endo/Other  negative endocrine ROS  Renal/GU negative Renal ROS     Musculoskeletal   Abdominal   Peds  Hematology negative hematology ROS (+)   Anesthesia Other Findings   Reproductive/Obstetrics                             Anesthesia Physical  Anesthesia Plan  ASA: III  Anesthesia Plan: General   Post-op Pain Management: GA combined w/ Regional for post-op pain   Induction: Intravenous  PONV Risk Score and Plan: 2 and Ondansetron, Dexamethasone and Treatment may vary due to age or medical condition  Airway Management Planned: LMA  Additional Equipment:   Intra-op Plan:   Post-operative Plan: Extubation in OR  Informed Consent: I have reviewed the patients History and Physical, chart, labs and discussed the procedure including the risks, benefits and alternatives for the proposed anesthesia with the patient or authorized representative who has indicated his/her understanding and acceptance.   Dental advisory given  Plan Discussed with: CRNA  Anesthesia Plan Comments:         Anesthesia Quick Evaluation

## 2017-04-01 NOTE — H&P (Signed)
Manuel Lewis  Location: [facility] Patient #: [id number] DOB: 1921/06/14 Married / Language: English / Race: White Male  History of Present Illness   The patient is a 84 year old male who presents with a more symptomatic recurrent right inguinal hernia.  The PCP is [name]  He comes with his son, Manuel Lewis.  Since I saw Manuel Lewis in August 2018, he's had increasing discomfort in his right groin associated with a hernia. This is particularly true when he is active. He has had no obstructive symptoms. He is interested in getting the hernia fixed. He did well with his gallbladder surgery in March 2018 and has had no further symptoms.  History of recurrent right inguinal hernia (10/2016): The patient comes for a recurrent right inguinal hernia. He had hernia repair in the [employer] in the 1940s. He is a little unsure of which side. But he has a right groin scar, that would be consistent with a prior open right inguinal hernia repair. He's noticed a right inguinal hernia for some time. But it seems like it is bothering him more now, particularly when he stands for a while. He had a open left inguinal hernia repair by [name] on 18 August 2011. This has done well. [name] it is noted did say this was a recurrent hernia. Manuel Lewis has also noticed some change in his bowels. He takes MiraLAX regularly. He says he is not getting much exercise. More specifically, he is not drinking as much liquid as normal because he developed to get up in the middle the night to urinate. He's had some urethral issues and underwent a dilatation in December 2017 at [facility]. He is to see [name] next week and I encouraged him to talk about his frequency of urination and if there are any concerns about having surgery.   I discussed the indications and complications of hernia surgery with the patient. I discussed both the laparoscopic and open  approach to hernia repair. I think that he is best served with an open hernia repair. The potential risks of hernia surgery include, but are not limited to, bleeding, infection, open surgery, nerve injury, and recurrence of the hernia.  I provided the patient literature about hernia surgery.  Past Medical History: 1. A. Fib Has pacemaker - placed 2011 2. Anticoagulated On coumadin - followed by Dr. Laurann Montana 3. HTN 4. History of prostate cancer Seed implant - 1998 Had bradycardia post op 5. History of urethral stricture dilatation 02/2016 [facility] Urologist is [name] 6. HOH 7. Laparoscopic cholecystectomy with cholangiogram on 18 May 2016. At the time of the cholangiogram, he appeared to have a narrowing of his distal common bile duct. ERCP by Dr. Lucio Edward on 21 May 2016 which was unremarkable for any common bile duct stricture 8. Open left inguinal hernia repair - 08/18/2011 - [facility] [note: he did him under MAC anesthesia]  Social History: Married. His son is Dr. Crissie Lewis   Allergies Alean Rinne, [place]; 01/29/2017 4:17 PM) No Known Drug Allergies 11/23/2013 Allergies Reconciled   Medication History Alean Rinne, [place]; 01/29/2017 4:17 PM) AmLODIPine Besylate (10MG Tablet, Oral) Active. Losartan Potassium (100MG Tablet, Oral) Active. Hydrochlorothiazide (12.5MG Capsule, Oral) Active. Warfarin Sodium (2MG Tablet, Oral) Active. Fluorouracil (5% Cream, External) Active. Medications Reconciled  Vitals Alean Rinne RMA; 01/29/2017 4:17 PM) 01/29/2017 4:17 PM Weight: 150.4 lb Height: 72in Body Surface Area: 1.89 m Body Mass Index: 20.4 kg/m  Temp.: 97.21F  Pulse: 90 (Regular)  BP: 155/80 (Sitting, Left Arm, Standard)   Physical Exam  General: WN older thin WM alert and generally healthy appearing. He is a little hard of hearing. HEENT: Normal.  Pupils equal.  Neck: Supple. No mass. No thyroid mass.  Lymph Nodes: No supraclavicular or cervical nodes.  Lungs: Clear to auscultation and symmetric breath sounds. Heart: No murmur or rub. Pacemaker in right upper chest. He appears to be in a sinus rythm right now.  Abdomen: Soft. No mass. No tenderness. No hernia. Normal bowel sounds.  He has a right abdominal scar from an appendectomy. He has a right groin scar from the prior right inguinal hernia repair - that he said occured in 1940's, while in the [employer]. Left groin scar. I do not feel a left inguinal hernia. He has a small reducible right inguinal hernia - that appears to be a recurrent hernia. It is easily reducible in a supine position. I don't think that this has changed much since August 2018.  Extremities: Good strength and ROM in upper and lower extremities.  Neurologic: Grossly intact to motor and sensory function. Psychiatric: Has normal mood and affect. Behavior is normal.    Assessment & Plan  1.  UNILATERAL RECURRENT INGUINAL HERNIA WITHOUT OBSTRUCTION OR GANGRENE (K40.91)  Plan:  1) Cardiac clearance - he sees S. Caryl Comes tomorrow Addendum Note(Pheobe Sandiford H. Lucia Gaskins MD; 01/30/2017 5:10 PM) From Olin Pia: From a surgical point, his functional status is really quite good. His surgical risk should be acceptable  Addendum Note(Johniya Durfee H. Lucia Gaskins MD; 02/13/2017 2:00 PM) From Dr. Sherren Mocha (02/01/2017) Thanks. Spoke with Dr Laurann Montana. Agree. Pt will hold warfarin without bridging.  2) Stop coumadin 7 days before surgery - look to input from Dr. Caryl Comes  3) Schedule open right inguinal hernia repair after cardiac clearance  2.  HISTORY OF LAPAROSCOPIC CHOLECYSTECTOMY (Z90.49)  Story: lap chole on 05/18/2016 - D. Marlan Steward  3.  ANTICOAGULATED ON COUMADIN (Z51.81)  On coumadin - followed by Dr. Laurann Montana  PT/INR - 18.8/1.58 on 03/31/2016  4. A. Fib Has pacemaker - placed 2011 5.  HTN 6. History of prostate cancer Seed implant - 1998 Had bradycardia post op 7. History of urethral stricture dilatation 02/2016 [facility] Urologist is [name] 8. HOH 9. Laparoscopic cholecystectomy with cholangiogram on 18 May 2016.   At the time of the cholangiogram, he appeared to have a narrowing of his distal common bile duct.  ERCP by Dr. Lucio Edward on 21 May 2016 which was unremarkable for any common bile duct stricture 10. Open left inguinal hernia repair - 08/18/2011 - [facility]  [note: he did him under MAC anesthesia]   Alphonsa Overall, MD, [facility] Pager: [phone] Office phone:  [phone]

## 2017-04-02 ENCOUNTER — Encounter (HOSPITAL_BASED_OUTPATIENT_CLINIC_OR_DEPARTMENT_OTHER): Payer: Self-pay | Admitting: Anesthesiology

## 2017-04-02 ENCOUNTER — Ambulatory Visit (HOSPITAL_BASED_OUTPATIENT_CLINIC_OR_DEPARTMENT_OTHER)
Admission: RE | Admit: 2017-04-02 | Discharge: 2017-04-02 | Disposition: A | Discharge status: Home / Self Care | Payer: Medicare Other | Source: Ambulatory Visit | Attending: Surgery | Admitting: Surgery

## 2017-04-02 ENCOUNTER — Encounter (HOSPITAL_BASED_OUTPATIENT_CLINIC_OR_DEPARTMENT_OTHER)
Admission: RE | Disposition: A | Discharge status: Home / Self Care | Payer: Self-pay | Source: Ambulatory Visit | Attending: Surgery

## 2017-04-02 ENCOUNTER — Ambulatory Visit (HOSPITAL_BASED_OUTPATIENT_CLINIC_OR_DEPARTMENT_OTHER): Payer: Medicare Other | Admitting: Anesthesiology

## 2017-04-02 DIAGNOSIS — I10 Essential (primary) hypertension: Secondary | ICD-10-CM | POA: Insufficient documentation

## 2017-04-02 DIAGNOSIS — I4891 Unspecified atrial fibrillation: Secondary | ICD-10-CM | POA: Diagnosis not present

## 2017-04-02 DIAGNOSIS — Z7901 Long term (current) use of anticoagulants: Secondary | ICD-10-CM | POA: Insufficient documentation

## 2017-04-02 DIAGNOSIS — Z79899 Other long term (current) drug therapy: Secondary | ICD-10-CM | POA: Insufficient documentation

## 2017-04-02 DIAGNOSIS — Z950 Presence of cardiac pacemaker: Secondary | ICD-10-CM | POA: Diagnosis not present

## 2017-04-02 DIAGNOSIS — K4091 Unilateral inguinal hernia, without obstruction or gangrene, recurrent: Secondary | ICD-10-CM | POA: Insufficient documentation

## 2017-04-02 DIAGNOSIS — Z8546 Personal history of malignant neoplasm of prostate: Secondary | ICD-10-CM | POA: Insufficient documentation

## 2017-04-02 HISTORY — PX: INSERTION OF MESH: SHX5868

## 2017-04-02 HISTORY — PX: INGUINAL HERNIA REPAIR: SHX194

## 2017-04-02 LAB — PROTIME-INR
INR: 1.36
PROTHROMBIN TIME: 16.7 s — AB (ref 11.4–15.2)

## 2017-04-02 SURGERY — REPAIR, HERNIA, INGUINAL, ADULT
Anesthesia: General | Site: Groin | Laterality: Right

## 2017-04-02 MED ORDER — PROMETHAZINE HCL 25 MG/ML IJ SOLN: 6.2500 mg | INTRAMUSCULAR | Status: DC | PRN

## 2017-04-02 MED ORDER — CHLORHEXIDINE GLUCONATE CLOTH 2 % EX PADS: 6.0000 | MEDICATED_PAD | Freq: Once | CUTANEOUS | Status: DC

## 2017-04-02 MED ORDER — BUPIVACAINE-EPINEPHRINE (PF) 0.5% -1:200000 IJ SOLN
INTRAMUSCULAR | Status: AC
  Filled 2017-04-02: qty 30

## 2017-04-02 MED ORDER — FENTANYL CITRATE (PF) 100 MCG/2ML IJ SOLN
INTRAMUSCULAR | Status: AC
  Filled 2017-04-02: qty 2

## 2017-04-02 MED ORDER — PROPOFOL 10 MG/ML IV BOLUS
INTRAVENOUS | Status: DC | PRN
  Administered 2017-04-02: 100 mg via INTRAVENOUS

## 2017-04-02 MED ORDER — MEPERIDINE HCL 25 MG/ML IJ SOLN: 6.2500 mg | INTRAMUSCULAR | Status: DC | PRN

## 2017-04-02 MED ORDER — DEXAMETHASONE SODIUM PHOSPHATE 10 MG/ML IJ SOLN
INTRAMUSCULAR | Status: AC
Start: 2017-04-02 — End: ?
  Filled 2017-04-02: qty 1

## 2017-04-02 MED ORDER — ACETAMINOPHEN 500 MG PO TABS
1000.0000 mg | ORAL_TABLET | ORAL | Status: AC
  Administered 2017-04-02: 1000 mg via ORAL

## 2017-04-02 MED ORDER — SCOPOLAMINE 1 MG/3DAYS TD PT72: 1.0000 | MEDICATED_PATCH | Freq: Once | TRANSDERMAL | Status: DC | PRN

## 2017-04-02 MED ORDER — GABAPENTIN 300 MG PO CAPS
ORAL_CAPSULE | ORAL | Status: AC
  Filled 2017-04-02: qty 1

## 2017-04-02 MED ORDER — LIDOCAINE 2% (20 MG/ML) 5 ML SYRINGE
INTRAMUSCULAR | Status: AC
  Filled 2017-04-02: qty 5

## 2017-04-02 MED ORDER — LACTATED RINGERS IV SOLN
INTRAVENOUS | Status: DC
  Administered 2017-04-02 (×3): via INTRAVENOUS

## 2017-04-02 MED ORDER — SODIUM CHLORIDE 0.9 % IJ SOLN
INTRAMUSCULAR | Status: DC | PRN
  Administered 2017-04-02: 10 mL

## 2017-04-02 MED ORDER — ONDANSETRON HCL 4 MG/2ML IJ SOLN
INTRAMUSCULAR | Status: AC
  Filled 2017-04-02: qty 2

## 2017-04-02 MED ORDER — GABAPENTIN 300 MG PO CAPS
300.0000 mg | ORAL_CAPSULE | ORAL | Status: AC
  Administered 2017-04-02: 300 mg via ORAL

## 2017-04-02 MED ORDER — HYDROCODONE-ACETAMINOPHEN 5-325 MG PO TABS: 1.0000 | ORAL_TABLET | Freq: Four times a day (QID) | ORAL | 0 refills | Status: DC | PRN

## 2017-04-02 MED ORDER — LIDOCAINE 2% (20 MG/ML) 5 ML SYRINGE
INTRAMUSCULAR | Status: DC | PRN
  Administered 2017-04-02: 100 mg via INTRAVENOUS

## 2017-04-02 MED ORDER — MIDAZOLAM HCL 2 MG/2ML IJ SOLN: 1.0000 mg | INTRAMUSCULAR | Status: DC | PRN

## 2017-04-02 MED ORDER — FENTANYL CITRATE (PF) 100 MCG/2ML IJ SOLN
50.0000 ug | INTRAMUSCULAR | Status: DC | PRN
  Administered 2017-04-02: 50 ug via INTRAVENOUS
  Administered 2017-04-02: 25 ug via INTRAVENOUS

## 2017-04-02 MED ORDER — BUPIVACAINE HCL (PF) 0.25 % IJ SOLN
INTRAMUSCULAR | Status: AC
  Filled 2017-04-02: qty 30

## 2017-04-02 MED ORDER — SODIUM CHLORIDE 0.9 % IJ SOLN
INTRAMUSCULAR | Status: AC
  Filled 2017-04-02: qty 20

## 2017-04-02 MED ORDER — BUPIVACAINE-EPINEPHRINE 0.5% -1:200000 IJ SOLN
INTRAMUSCULAR | Status: DC | PRN
  Administered 2017-04-02: 10 mL

## 2017-04-02 MED ORDER — FENTANYL CITRATE (PF) 100 MCG/2ML IJ SOLN: 25.0000 ug | INTRAMUSCULAR | Status: DC | PRN

## 2017-04-02 MED ORDER — PHENYLEPHRINE HCL 10 MG/ML IJ SOLN
INTRAMUSCULAR | Status: DC | PRN
  Administered 2017-04-02: 120 ug via INTRAVENOUS
  Administered 2017-04-02: 80 ug via INTRAVENOUS

## 2017-04-02 MED ORDER — ONDANSETRON HCL 4 MG/2ML IJ SOLN
INTRAMUSCULAR | Status: DC | PRN
  Administered 2017-04-02: 4 mg via INTRAVENOUS

## 2017-04-02 MED ORDER — CEFAZOLIN SODIUM-DEXTROSE 2-4 GM/100ML-% IV SOLN
INTRAVENOUS | Status: AC
  Filled 2017-04-02: qty 100

## 2017-04-02 MED ORDER — DEXAMETHASONE SODIUM PHOSPHATE 4 MG/ML IJ SOLN
INTRAMUSCULAR | Status: DC | PRN
  Administered 2017-04-02: 4 mg via INTRAVENOUS

## 2017-04-02 MED ORDER — HYDROCODONE-ACETAMINOPHEN 7.5-325 MG PO TABS: 1.0000 | ORAL_TABLET | Freq: Once | ORAL | Status: DC | PRN

## 2017-04-02 MED ORDER — BUPIVACAINE-EPINEPHRINE (PF) 0.5% -1:200000 IJ SOLN
INTRAMUSCULAR | Status: DC | PRN
  Administered 2017-04-02: 25 mL

## 2017-04-02 MED ORDER — ACETAMINOPHEN 500 MG PO TABS
ORAL_TABLET | ORAL | Status: AC
  Filled 2017-04-02: qty 2

## 2017-04-02 MED ORDER — PROPOFOL 10 MG/ML IV BOLUS
INTRAVENOUS | Status: AC
  Filled 2017-04-02: qty 20

## 2017-04-02 MED ORDER — CEFAZOLIN SODIUM-DEXTROSE 2-4 GM/100ML-% IV SOLN
2.0000 g | INTRAVENOUS | Status: AC
  Administered 2017-04-02: 2 g via INTRAVENOUS

## 2017-04-02 SURGICAL SUPPLY — 50 items
ADH SKN CLS APL DERMABOND .7 (GAUZE/BANDAGES/DRESSINGS) ×1
APL SKNCLS STERI-STRIP NONHPOA (GAUZE/BANDAGES/DRESSINGS)
BENZOIN TINCTURE PRP APPL 2/3 (GAUZE/BANDAGES/DRESSINGS) IMPLANT
BLADE CLIPPER SURG (BLADE) ×3 IMPLANT
BLADE SURG 10 STRL SS (BLADE) ×3 IMPLANT
CHLORAPREP W/TINT 26ML (MISCELLANEOUS) ×3 IMPLANT
CLEANER CAUTERY TIP 5X5 PAD (MISCELLANEOUS) ×1 IMPLANT
CLOSURE WOUND 1/2 X4 (GAUZE/BANDAGES/DRESSINGS)
COVER BACK TABLE 60X90IN (DRAPES) ×3 IMPLANT
COVER MAYO STAND STRL (DRAPES) ×3 IMPLANT
DECANTER SPIKE VIAL GLASS SM (MISCELLANEOUS) ×1 IMPLANT
DERMABOND ADVANCED (GAUZE/BANDAGES/DRESSINGS) ×2
DERMABOND ADVANCED .7 DNX12 (GAUZE/BANDAGES/DRESSINGS) ×1 IMPLANT
DRAIN PENROSE 1/2X12 LTX STRL (WOUND CARE) ×3 IMPLANT
DRAPE LAPAROTOMY T 102X78X121 (DRAPES) ×3 IMPLANT
DRAPE UTILITY XL STRL (DRAPES) ×3 IMPLANT
ELECT REM PT RETURN 9FT ADLT (ELECTROSURGICAL) ×3
ELECTRODE REM PT RTRN 9FT ADLT (ELECTROSURGICAL) ×1 IMPLANT
GAUZE SPONGE 4X4 12PLY STRL LF (GAUZE/BANDAGES/DRESSINGS) IMPLANT
GLOVE BIO SURGEON STRL SZ 6.5 (GLOVE) ×1 IMPLANT
GLOVE BIO SURGEONS STRL SZ 6.5 (GLOVE) ×1
GLOVE BIOGEL PI IND STRL 7.0 (GLOVE) IMPLANT
GLOVE BIOGEL PI INDICATOR 7.0 (GLOVE) ×2
GLOVE EXAM NITRILE MD LF STRL (GLOVE) ×2 IMPLANT
GLOVE SURG SIGNA 7.5 PF LTX (GLOVE) ×3 IMPLANT
GOWN STRL REUS W/ TWL LRG LVL3 (GOWN DISPOSABLE) ×2 IMPLANT
GOWN STRL REUS W/TWL LRG LVL3 (GOWN DISPOSABLE) ×6
MESH ULTRAPRO 3X6 7.6X15CM (Mesh General) ×2 IMPLANT
NDL HYPO 25X1 1.5 SAFETY (NEEDLE) ×1 IMPLANT
NEEDLE HYPO 25X1 1.5 SAFETY (NEEDLE) ×3 IMPLANT
NS IRRIG 1000ML POUR BTL (IV SOLUTION) ×2 IMPLANT
PACK BASIN DAY SURGERY FS (CUSTOM PROCEDURE TRAY) ×3 IMPLANT
PAD CLEANER CAUTERY TIP 5X5 (MISCELLANEOUS) ×2
PENCIL BUTTON HOLSTER BLD 10FT (ELECTRODE) ×3 IMPLANT
SLEEVE SCD COMPRESS KNEE MED (MISCELLANEOUS) ×2 IMPLANT
SPONGE INTESTINAL PEANUT (DISPOSABLE) IMPLANT
SPONGE LAP 18X18 X RAY DECT (DISPOSABLE) ×3 IMPLANT
STRIP CLOSURE SKIN 1/2X4 (GAUZE/BANDAGES/DRESSINGS) IMPLANT
SUT CHROMIC 2 0 SH (SUTURE) IMPLANT
SUT MON AB 5-0 PS2 18 (SUTURE) ×3 IMPLANT
SUT NOVA 0 T19/GS 22DT (SUTURE) ×8 IMPLANT
SUT VIC AB 0 SH 27 (SUTURE) IMPLANT
SUT VIC AB 2-0 SH 27 (SUTURE) ×3
SUT VIC AB 2-0 SH 27XBRD (SUTURE) IMPLANT
SUT VICRYL 3-0 CR8 SH (SUTURE) ×5 IMPLANT
SUT VICRYL AB 2 0 TIE (SUTURE) IMPLANT
SUT VICRYL AB 2 0 TIES (SUTURE)
SYR CONTROL 10ML LL (SYRINGE) ×3 IMPLANT
TOWEL OR 17X24 6PK STRL BLUE (TOWEL DISPOSABLE) ×6 IMPLANT
TOWEL OR NON WOVEN STRL DISP B (DISPOSABLE) ×1 IMPLANT

## 2017-04-02 NOTE — Anesthesia Postprocedure Evaluation (Signed)
Anesthesia Post Note  Patient: Manuel Lewis  Procedure(s) Performed: OPEN RIGHT INGUINAL HERNIA REPAIR (Right Groin) INSERTION OF MESH (Right Groin)     Patient location during evaluation: PACU Anesthesia Type: General Level of consciousness: sedated and patient cooperative Pain management: pain level controlled Vital Signs Assessment: post-procedure vital signs reviewed and stable Respiratory status: spontaneous breathing Cardiovascular status: stable Anesthetic complications: no    Last Vitals:  Vitals:   04/02/17 1015 04/02/17 1030  BP: 135/67 133/78  Pulse: 64 67  Resp: 14 18  Temp:  36.5 C  SpO2: 100% 98%    Last Pain:  Vitals:   04/02/17 1030  TempSrc:   PainSc: [place]

## 2017-04-02 NOTE — Discharge Instructions (Signed)
[facility] - DISCHARGE INSTRUCTIONS TO PATIENT  Activity:  Driving - May drive in 3 to 5 days, if doing well and off pain meds   Lifting - No lifting more than 15 pounds for 4 weeks.  Wound Care:   Leave incision dry for 2 days, then you may shower  Diet:  As tolerated  Follow up appointment:  Call Dr. Pollie Friar office [facility]) at [phone] for an appointment in 2 to 4 weeks.  Medications and dosages:  Resume your home medications.  You have a prescription for:  Vicodin  Call Dr. Lucia Gaskins or his office  [phone]) if you have:  Temperature greater than 100.4,  Persistent nausea and vomiting,  Severe uncontrolled pain,  Redness, tenderness, or signs of infection (pain, swelling, redness, odor or green/yellow discharge around the site),  Difficulty breathing, headache or visual disturbances,  Any other questions or concerns you may have after discharge.  In an emergency, call 911 or go to an Emergency Department at a nearby hospital.   Last Dose Of Tylenol given at 6:55am.   Post Anesthesia Home Care Instructions  Activity: Get plenty of rest for the remainder of the day. A responsible individual must stay with you for 24 hours following the procedure.  For the next 24 hours, DO NOT: -Drive a car -Paediatric nurse -Drink alcoholic beverages -Take any medication unless instructed by your physician -Make any legal decisions or sign important papers.  Meals: Start with liquid foods such as gelatin or soup. Progress to regular foods as tolerated. Avoid greasy, spicy, heavy foods. If nausea and/or vomiting occur, drink only clear liquids until the nausea and/or vomiting subsides. Call your physician if vomiting continues.  Special Instructions/Symptoms: Your throat may feel dry or sore from the anesthesia or the breathing tube placed in your throat during surgery. If this causes discomfort, gargle with warm salt water. The discomfort  should disappear within 24 hours.  If you had a scopolamine patch placed behind your ear for the management of post- operative nausea and/or vomiting:  1. The medication in the patch is effective for 72 hours, after which it should be removed.  Wrap patch in a tissue and discard in the trash. Wash hands thoroughly with soap and water. 2. You may remove the patch earlier than 72 hours if you experience unpleasant side effects which may include dry mouth, dizziness or visual disturbances. 3. Avoid touching the patch. Wash your hands with soap and water after contact with the patch.

## 2017-04-02 NOTE — Progress Notes (Signed)
Assisted Dr. Germeroth with right, ultrasound guided, transabdominal plane block. Side rails up, monitors on throughout procedure. See vital signs in flow sheet. Tolerated Procedure well. 

## 2017-04-02 NOTE — Transfer of Care (Signed)
Immediate Anesthesia Transfer of Care Note  Patient: Manuel Lewis  Procedure(s) Performed: OPEN RIGHT INGUINAL HERNIA REPAIR (Right Groin) INSERTION OF MESH (Right Groin)  Patient Location: PACU  Anesthesia Type:GA combined with regional for post-op pain  Level of Consciousness: sedated  Airway & Oxygen Therapy: Patient Spontanous Breathing and Patient connected to face mask oxygen  Post-op Assessment: Report given to RN and Post -op Vital signs reviewed and stable  Post vital signs: Reviewed and stable  Last Vitals:  Vitals:   04/02/17 0936 04/02/17 0937  BP: 113/63   Pulse:  64  Resp:  14  Temp:    SpO2:  100%    Last Pain:  Vitals:   04/02/17 0744  TempSrc: Oral  PainSc: 0-No pain         Complications: No apparent anesthesia complications

## 2017-04-02 NOTE — Op Note (Signed)
04/02/2017  9:31 AM  PATIENT:  Manuel Lewis, 84 y.o., male, MRN: [id number]  PREOP DIAGNOSIS:  RECURRENT RIGHT INGUINAL HERNIA  POSTOP DIAGNOSIS:   Recurrent indirect right inguinal hernia  PROCEDURE:   Procedure(s): OPEN RIGHT INGUINAL HERNIA REPAIR, INSERTION OF MESH  SURGEON:   Alphonsa Overall, M.D.  ANESTHESIA:   general  Anesthesiologist: Nolon Nations, MD CRNA: Blocker, Ernesta Amble, CRNA; Maryella Shivers, CRNA  General  EBL:  minimal  ml  LOCAL MEDICATIONS USED:   Anesthesia placed a block, 20 cc of 1/4% marcaine  SPECIMEN:   Hernia sac  COUNTS CORRECT:  YES  INDICATIONS FOR PROCEDURE:  Manuel Lewis is a 84 y.o. (DOB: Sep 08, 1921) white male whose primary care physician is [name], MD and comes for repair of recurrent right inguinal hernia.    He had been on coumadin and stopped this 7 days ago.  His INR today was 1.36   The indications and risks of the hernia surgery were explained to the patient.  The risks include, but are not limited to, infection, bleeding, recurrence of the hernia, and nerve injury.  Operative Note: The patient was taken to room number 1 at [facility].  He underwent a general anesthesia.    A time out was held and the surgical checklist run.  His lower abdomen was shaved and then prepped with chloroprep.  A right inguinal incision was made through the subcutaneous fat to the external oblique fascia.  The external ring was opened and the cord structures encircled with a penrose drain.  The patient had a large indirect inguinal hernia.  His inguinal floor was in good shape.  The patient had a large hernia sac the came along the anterior medial portion of the cord structures.  There was also a large component of preperitoneal fat. The whole mass protruded 14 cm, 7 cm of preperitoneal fat and 7 cm of hernia sac.  The sac was dissected free of the cord structures.  The sac was opened and went to the peritoneal cavity.  There was no palpable  mass within the peritoneal cavity.  The sac was ligated with a 2-0 Vicryl suture.  The inguinal floor was repaired with a 3 x 6 inch piece of Ultrapro mesh.  The mesh was cut to fit the inguinal floor.  The mesh was sewn in place with interrupted 0 Novafil suture.  A key hole was made for the internal ring.  I sewed the mesh medially to the pubic tubercle, inferiorly to the shelving edge of the inguinal ligament, and superiorly to the transversalis fascia.  The mesh lay flat.  The inguinal floor was covered and the internal ring recreated.  The cord structures were returned to a normal location.  The external oblique was closed with a 3-0 vicryl.  The fascia and subcutaneous tissues were infiltrated with 20 cc of 1/4% marcaine plain.  The skin was closed with 4-0 monocryl and painted with Dermabond. The sponge and needle count were correct at the end of the case.  The patient was transported to the recovery room in good condition.  The patient will go home today.  Discharge instructions reviewed.  Alphonsa Overall, MD, [facility] Pager: [phone] Office phone:  [phone]

## 2017-04-02 NOTE — Anesthesia Procedure Notes (Signed)
Anesthesia Regional Block: TAP block   Pre-Anesthetic Checklist: ,, timeout performed, Correct Patient, Correct Site, Correct Laterality, Correct Procedure, Correct Position, site marked, Risks and benefits discussed,  Surgical consent,  Pre-op evaluation,  At surgeon's request and post-op pain management  Laterality: Right  Prep: chloraprep       Needles:   Needle Type: Stimiplex     Needle Length: 9cm      Additional Needles:   Procedures:,,,, ultrasound used (permanent image in chart),,,,  Narrative:  Start time: 04/02/2017 7:20 AM End time: 04/02/2017 7:25 AM Injection made incrementally with aspirations every 5 mL.  Performed by: Personally  Anesthesiologist: Nolon Nations, MD  Additional Notes: Patient tolerated well. Good fascial spread noted.

## 2017-04-02 NOTE — Interval H&P Note (Signed)
History and Physical Interval Note:  04/02/2017 7:15 AM  Manuel Lewis  has presented today for surgery, with the diagnosis of RECURRENT RIGHT INGUINAL HERNIA  The various methods of treatment have been discussed with the patient and family.  INR was 1.58 on 03/31/2017 - pending today.  Son, Thomson Herbers, with patient.  After consideration of risks, benefits and other options for treatment, the patient has consented to  Procedure(s): OPEN RIGHT INGUINAL HERNIA REPAIR (Right) INSERTION OF MESH (Right) as a surgical intervention .  The patient's history has been reviewed, patient examined, no change in status, stable for surgery.  I have reviewed the patient's chart and labs.  Questions were answered to the patient's satisfaction.     Shann Medal

## 2017-04-02 NOTE — Anesthesia Procedure Notes (Signed)
Procedure Name: LMA Insertion Date/Time: 04/02/2017 7:55 AM Performed by: Maryella Shivers, CRNA Pre-anesthesia Checklist: Patient identified, Emergency Drugs available, Suction available and Patient being monitored Patient Re-evaluated:Patient Re-evaluated prior to induction Oxygen Delivery Method: Circle system utilized Preoxygenation: Pre-oxygenation with 100% oxygen Induction Type: IV induction Ventilation: Mask ventilation without difficulty LMA: LMA inserted LMA Size: 5.0 Number of attempts: 2 (4 to 5 LMA) Airway Equipment and Method: Bite block Placement Confirmation: positive ETCO2 Tube secured with: Tape Dental Injury: Teeth and Oropharynx as per pre-operative assessment

## 2017-04-04 ENCOUNTER — Encounter (HOSPITAL_BASED_OUTPATIENT_CLINIC_OR_DEPARTMENT_OTHER): Payer: Self-pay | Admitting: Surgery

## 2017-05-04 ENCOUNTER — Ambulatory Visit: Payer: Medicare Other | Admitting: *Deleted

## 2017-05-04 ENCOUNTER — Telehealth: Payer: Self-pay | Admitting: Cardiology

## 2017-05-04 NOTE — Telephone Encounter (Signed)
Confirmed remote transmission w/ pt wife.   

## 2017-05-07 ENCOUNTER — Encounter: Payer: Self-pay | Admitting: Cardiology

## 2017-05-07 NOTE — Progress Notes (Signed)
Not received  

## 2017-05-22 ENCOUNTER — Ambulatory Visit (INDEPENDENT_AMBULATORY_CARE_PROVIDER_SITE_OTHER): Payer: Medicare Other | Admitting: *Deleted

## 2017-05-22 DIAGNOSIS — R001 Bradycardia, unspecified: Secondary | ICD-10-CM | POA: Diagnosis not present

## 2017-05-25 ENCOUNTER — Encounter: Payer: Self-pay | Admitting: Cardiology

## 2017-05-25 NOTE — Progress Notes (Signed)
Remote pacemaker transmission.   

## 2017-06-07 LAB — CUP PACEART REMOTE DEVICE CHECK
Battery Impedance: 3705 Ohm
Brady Statistic RV Percent Paced: 100 %
Lead Channel Impedance Value: 0 Ohm
Lead Channel Impedance Value: 481 Ohm
Lead Channel Pacing Threshold Amplitude: 0.75 V
Lead Channel Setting Pacing Amplitude: 2.5 V
Lead Channel Setting Pacing Pulse Width: 0.4 ms
Lead Channel Setting Sensing Sensitivity: 2.8 mV
MDC IDC LEAD IMPLANT DT: 20110531
MDC IDC LEAD LOCATION: 753860
MDC IDC LEAD SERIAL: 484940
MDC IDC MSMT BATTERY REMAINING LONGEVITY: 15 mo
MDC IDC MSMT BATTERY VOLTAGE: 2.7 V
MDC IDC MSMT LEADCHNL RV PACING THRESHOLD PULSEWIDTH: 0.4 ms
MDC IDC PG IMPLANT DT: 20110531
MDC IDC SESS DTM: 20190308210532

## 2017-06-12 IMAGING — CR DG CHEST 2V
2 series · 2 of 2 positions shown · non-contrast
Comparison: 08/14/2011

CLINICAL DATA: Upper abdominal pain

EXAM:
CHEST  2 VIEW

[w chest pa]
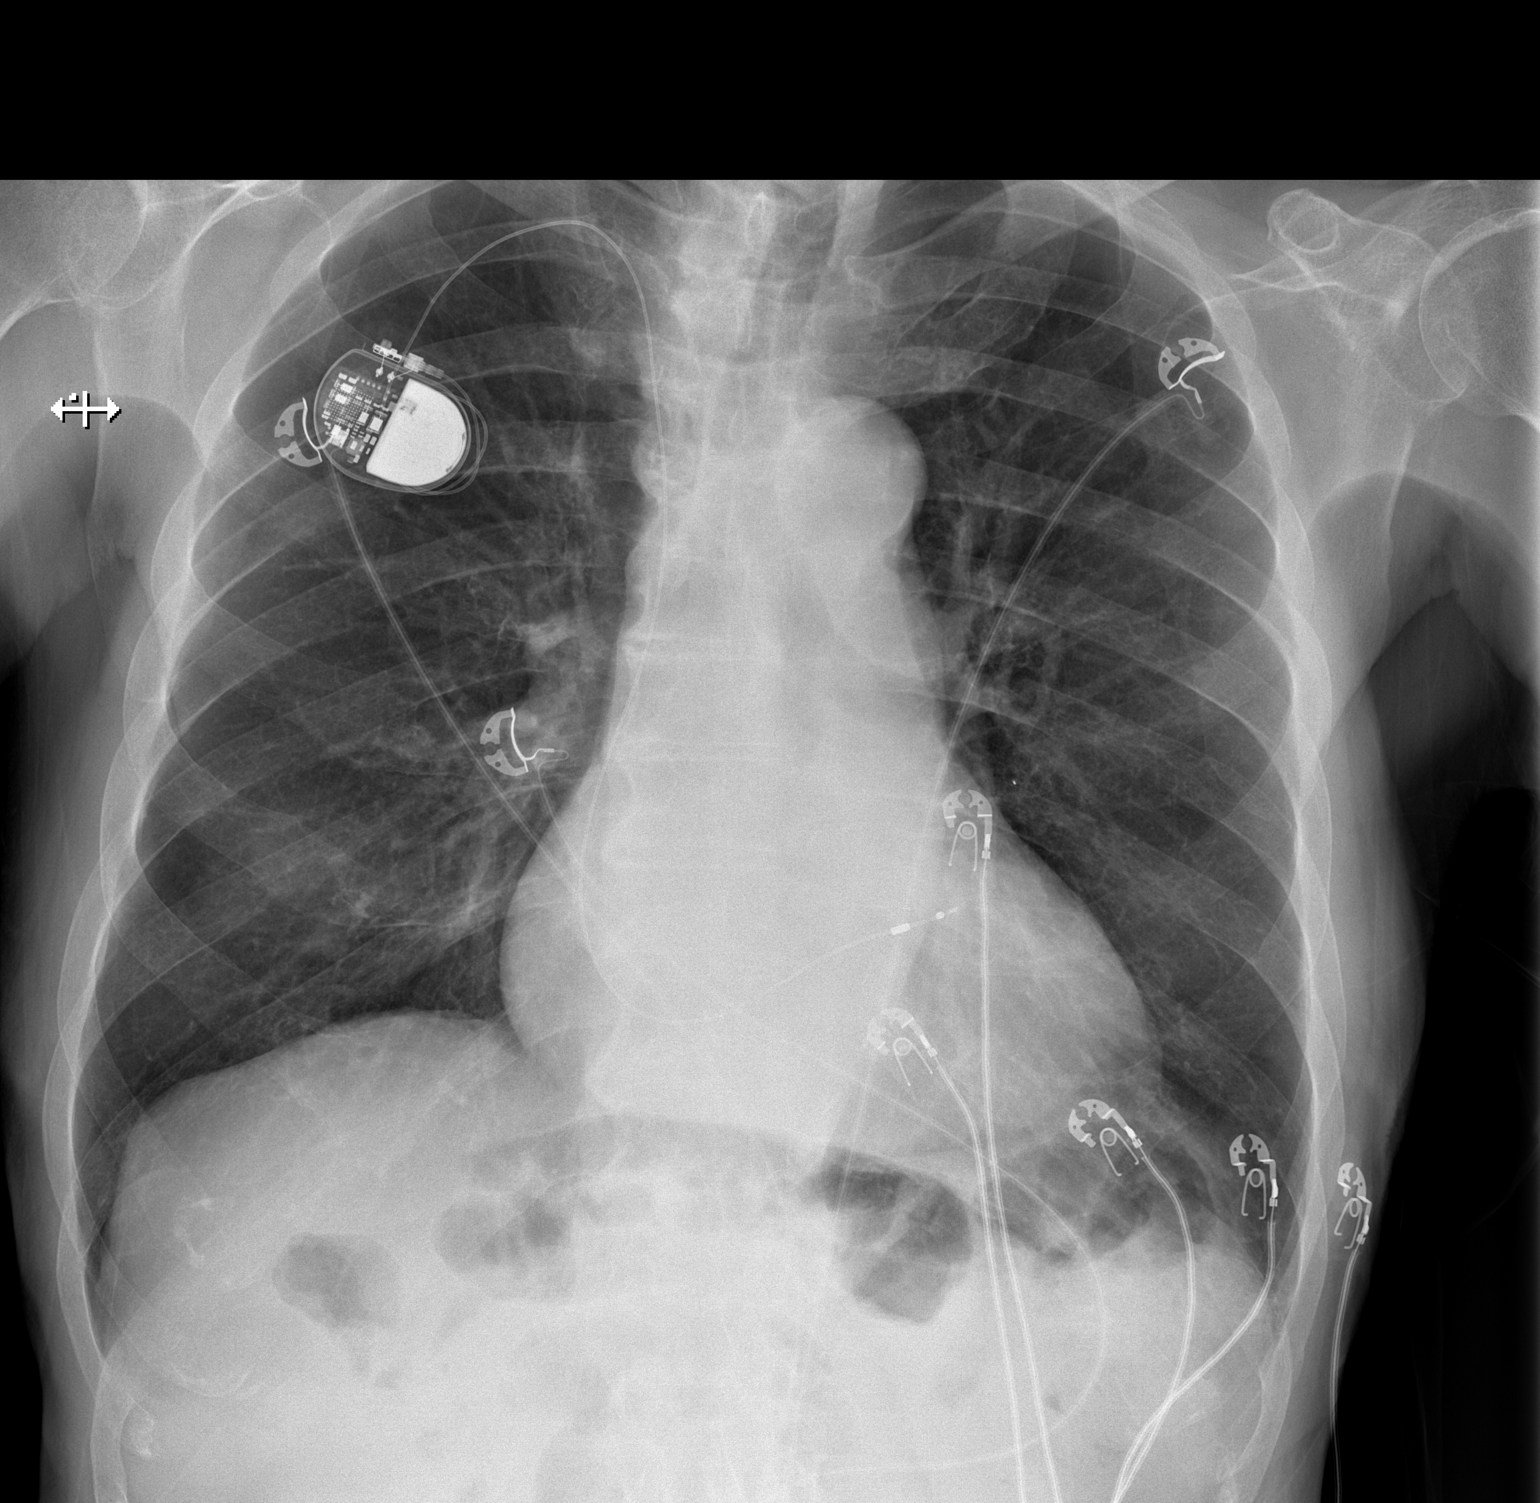

[w chest lat]
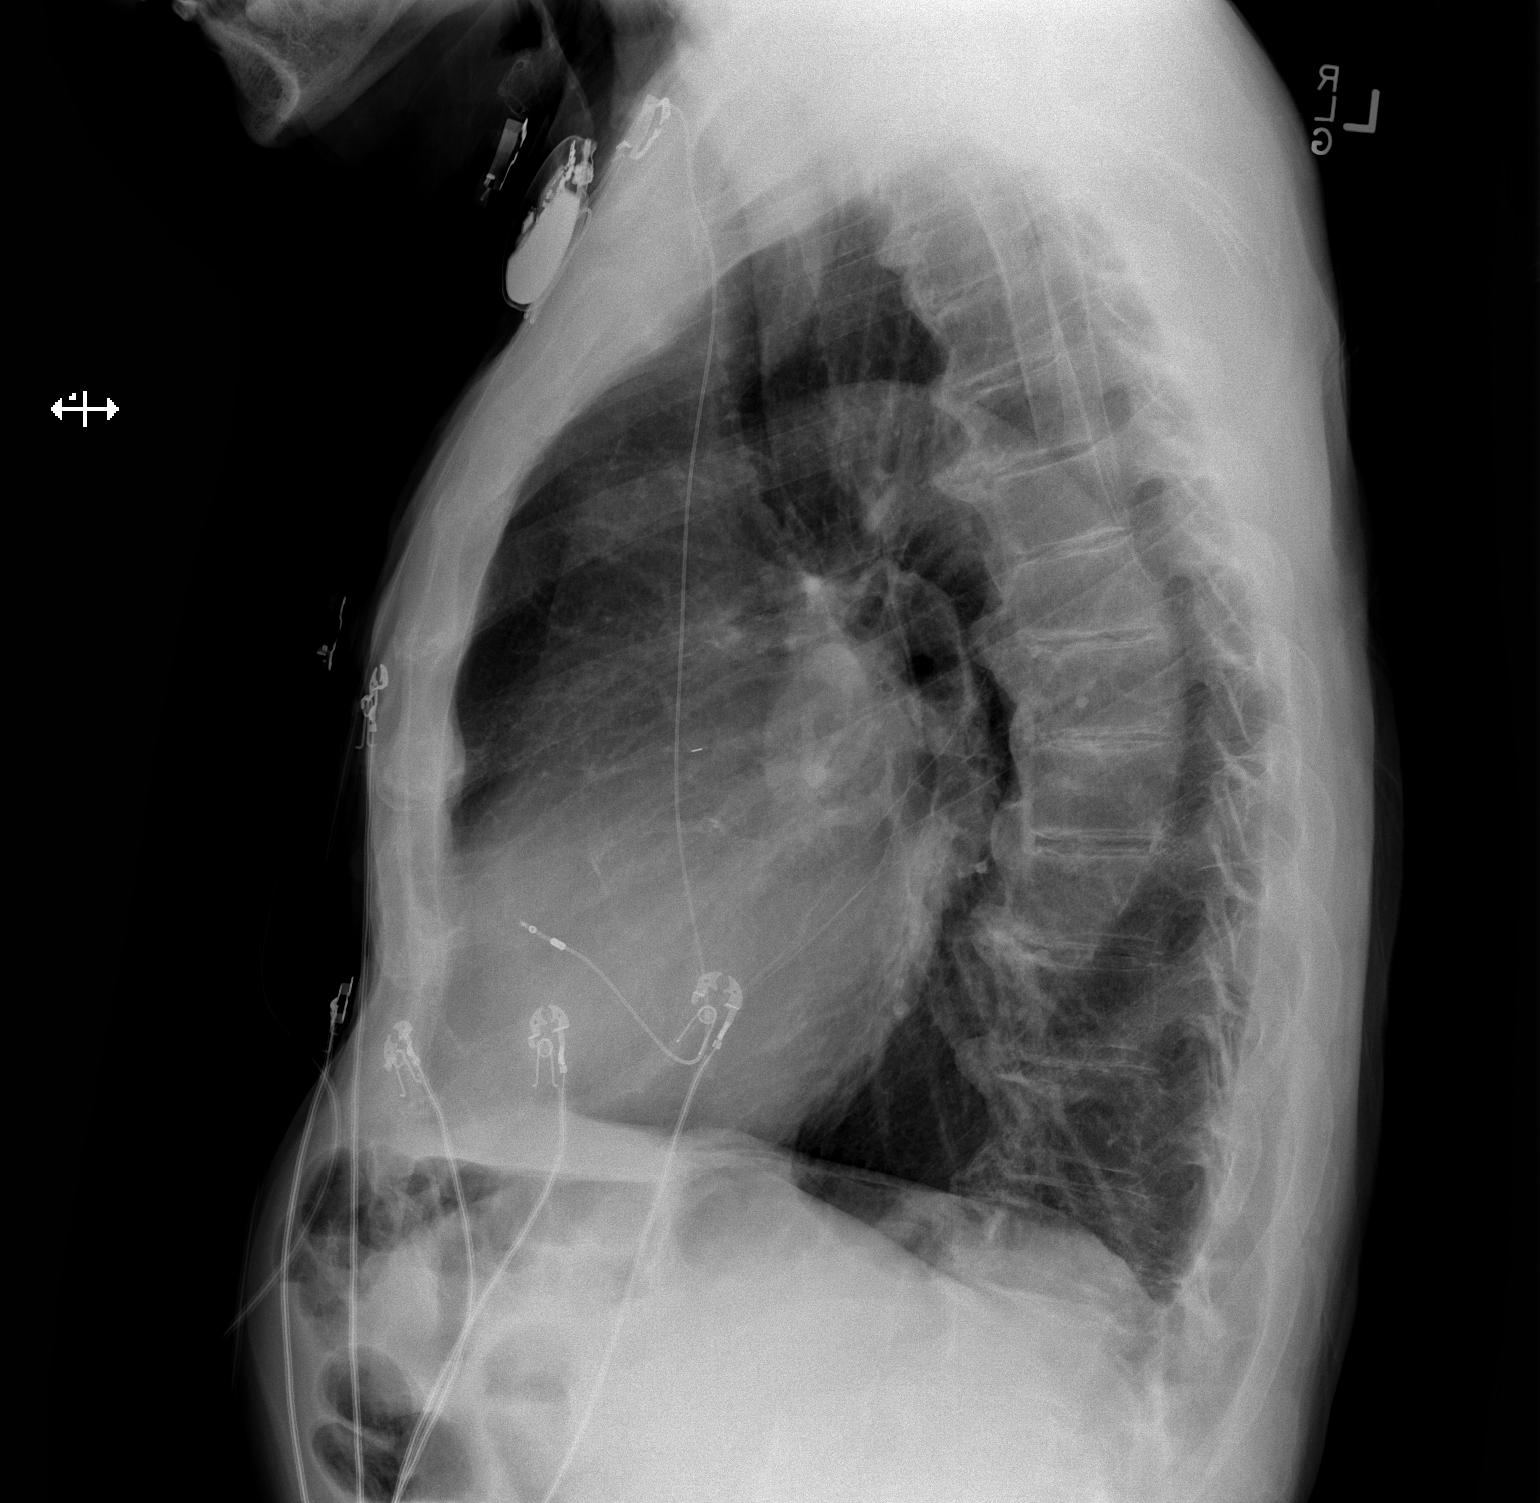

[2 of 2 positions shown; findings below may reference images not displayed]

FINDINGS: Single lead pacer lead in the right ventricle unchanged. Heart size
upper normal. Negative for heart failure. Lungs are clear without
infiltrate effusion or mass.
IMPRESSION: No active cardiopulmonary disease.

## 2017-06-15 IMAGING — CT CT ABD-PEL WO/W CM
3 of 11 series · 14 of 46 positions shown, 16 images · IV contrast (iopamidol)
Comparison: Intraoperative cholangiogram dated 05/18/2016. Right
upper quadrant ultrasound dated 05/16/2016.

CLINICAL DATA: Status post laparoscopic cholecystectomy, CBD
obstruction on intraoperative cholangiogram, possible CBD/pancreatic
mass

EXAM:
CT ABDOMEN AND PELVIS WITHOUT AND WITH CONTRAST
TECHNIQUE: Multidetector CT imaging of the abdomen and pelvis was performed
following the standard protocol before and following the bolus
administration of intravenous contrast.
CONTRAST:  100mL SO2AKJ-NYY IOPAMIDOL (SO2AKJ-NYY) INJECTION 61%

[Series 2: axial pre · axial · non-contrast · 0.81mm/px · z∈[-312,-247]mm · 2 of 39 slices shown]
[im 13/39  soft-tissue]
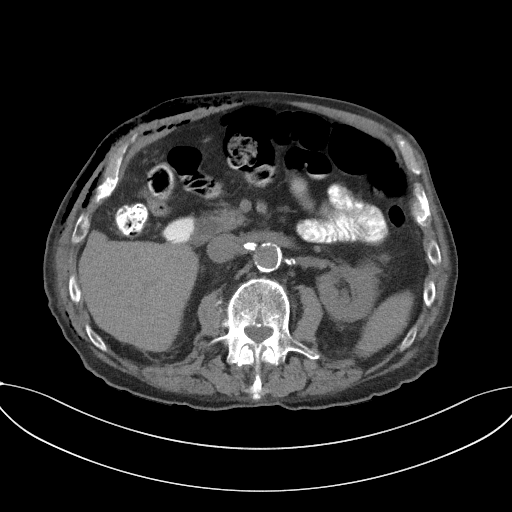
[im 26/39  soft-tissue]
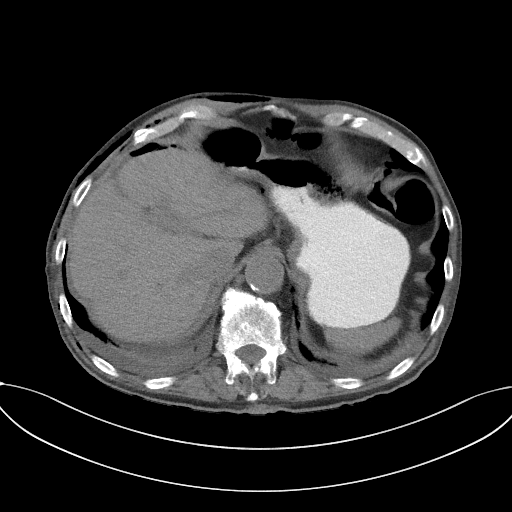

[Series 7: axial venous · axial · portal-venous · 0.81mm/px · z∈[-551,-224]mm · 9 of 137 slices shown, 11 images]
[im 14/137  soft-tissue]
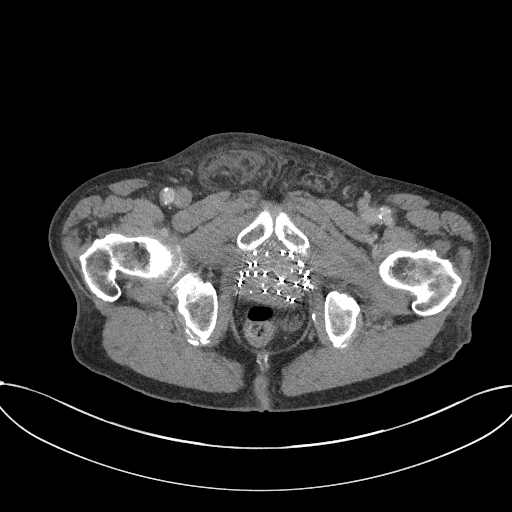
[im 14/137  bone]
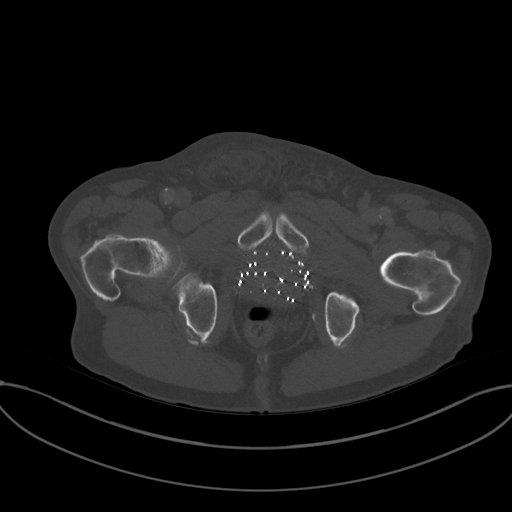
[im 28/137  soft-tissue]
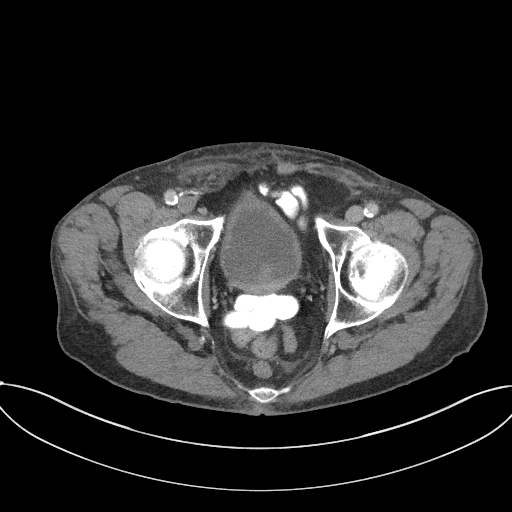
[im 41/137  soft-tissue]
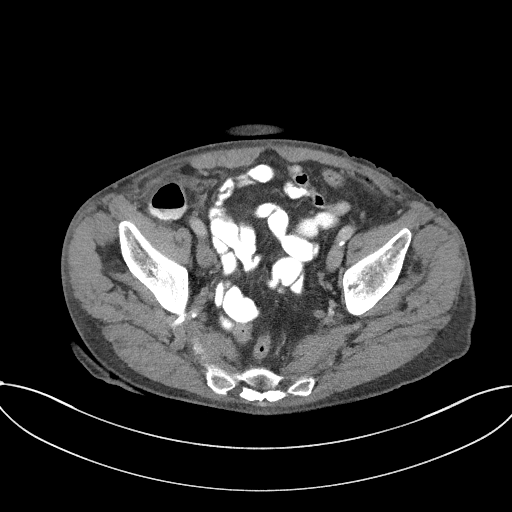
[im 55/137  soft-tissue]
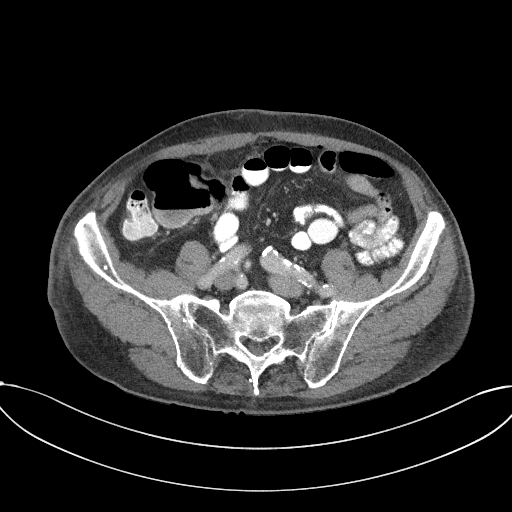
[im 69/137  soft-tissue]
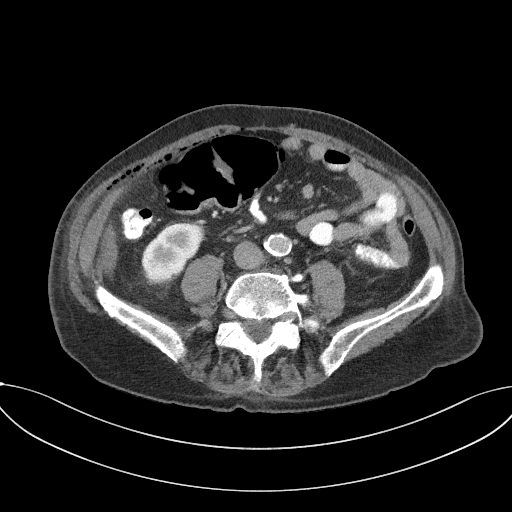
[im 82/137  soft-tissue]
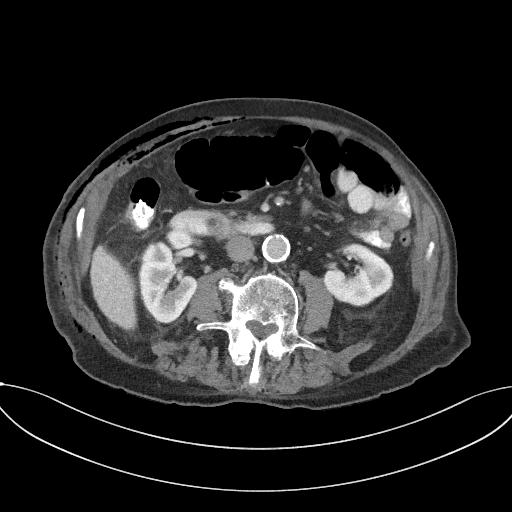
[im 96/137  soft-tissue]
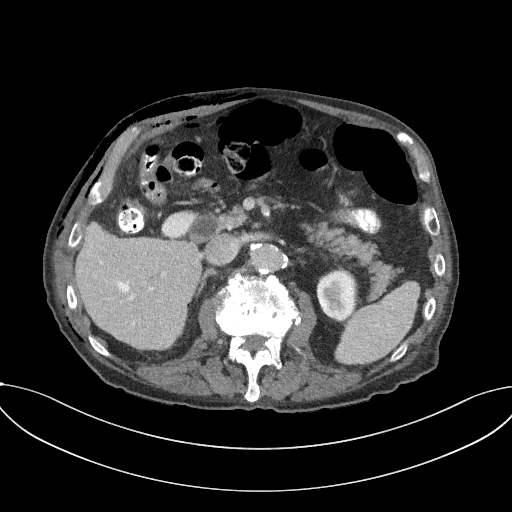
[im 109/137  soft-tissue]
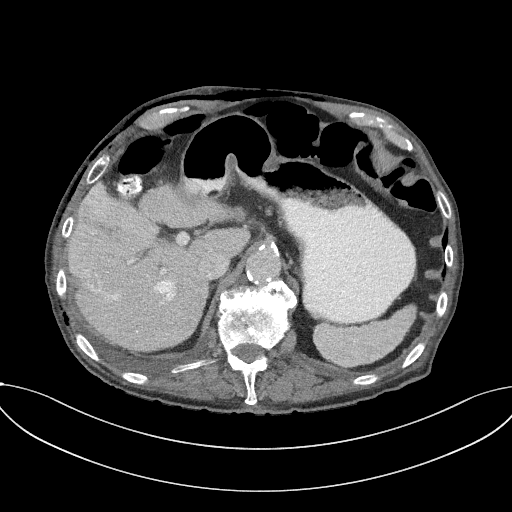
[im 123/137  soft-tissue]
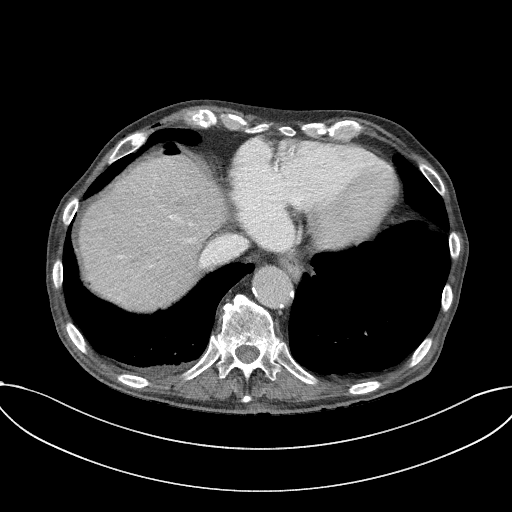
[im 123/137  bone]
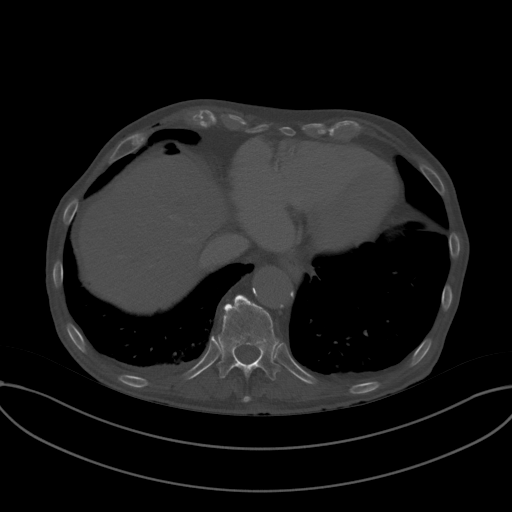

[Series 11: coronal arterial · coronal · arterial · 0.45mm/px · 3 of 100 slices shown]
[im 25/100  soft-tissue]
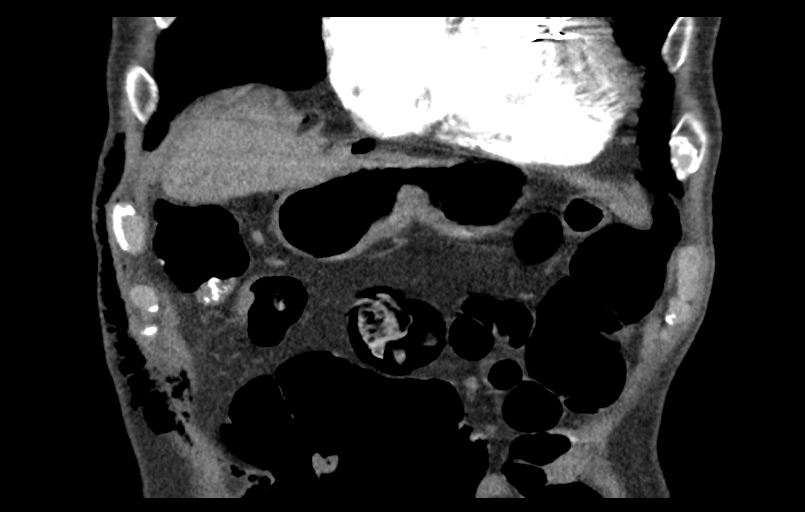
[im 50/100  soft-tissue]
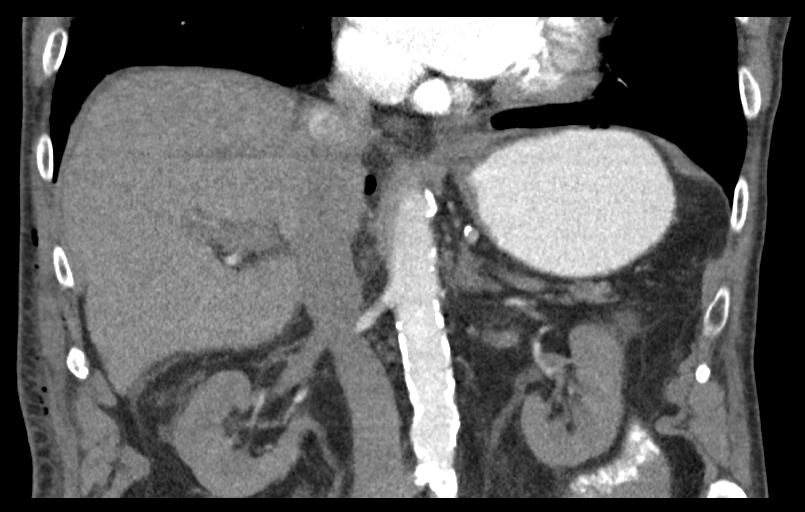
[im 75/100  soft-tissue]
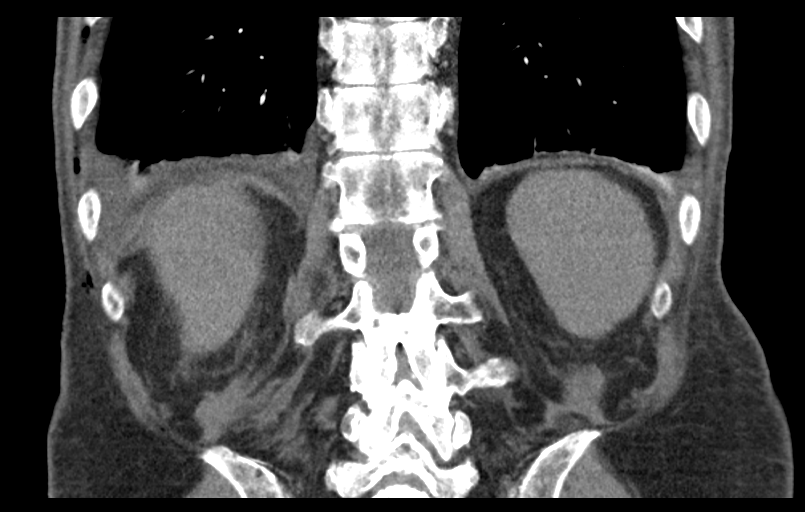

[14 of 46 positions shown; findings below may reference images not displayed]

FINDINGS: Lower chest: Mild patchy left lower lobe opacity (series 3/image 1),
possibly reflecting aspiration or pneumonia.

Small bilateral pleural effusions, right greater than left.
Associated mild left basilar atelectasis.

Hepatobiliary: Liver is grossly unremarkable. Calcified granuloma in
segment 8 (series 4/ image 23).

Status post cholecystectomy. Mild fluid in the gallbladder fossa
(series 13/image 44), postsurgical.

No intrahepatic duct dilatation. Dilated common duct, measuring up
to 14 mm at its midportion (series 13/ image 44), smoothly tapering
at the ampulla. No obstructing mass is seen.

Pancreas: Within normal limits. No mass along the pancreatic head.
No dilated pancreatic duct or atrophy along the pancreatic
body/tail.

Spleen: Within normal limits.

Adrenals/Urinary Tract: Adrenal glands are within normal limits.

Small right renal cysts measuring up to 1.1 cm (series 7/image 62).
7 mm left lower pole renal cyst (series 7/ image 62). No
hydronephrosis.

Bladder is mildly thick-walled/trabeculated, suggesting chronic
bladder outlet obstruction. Nodular thickening along the left
posterior bladder wall is favored to reflect enlargement of the
central gland of the prostate rather than an irregular bladder wall.

Stomach/Bowel: Stomach is within normal limits.

No evidence of bowel obstruction.

Prior appendectomy.

Left colon is decompressed.

Vascular/Lymphatic: No evidence of abdominal aortic aneurysm.

Atherosclerotic calcifications of the abdominal aorta and branch
vessels.

No suspicious abdominopelvic lymphadenopathy.

Reproductive: Brachytherapy seeds in the prostate.

Enlargement/ nodularity of the central gland, which indents the left
posterior bladder (series 7/image 114). Tumor is not excluded.

Other: No abdominopelvic ascites.

Scattered foci of free air, postsurgical.

Additional postsurgical changes/gas along the right anterior
abdominal wall.

Moderate fat containing right inguinal hernia with fluid/stranding.

Musculoskeletal: Degenerative changes of the visualized
thoracolumbar spine.
IMPRESSION: Status post cholecystectomy with associated postsurgical changes.

Dilated common duct, measuring 14 mm at its midportion. No
obstructing mass is evident on CT.

Mild patchy left lower lobe opacity, possibly reflecting aspiration
or pneumonia. Small bilateral pleural effusions with associated mild
bibasilar atelectasis.

Brachytherapy seeds in the prostate. Suspected BPH indenting the
left posterior base of the bladder, tumor not excluded.

Additional ancillary findings as above.

## 2017-08-24 ENCOUNTER — Ambulatory Visit (INDEPENDENT_AMBULATORY_CARE_PROVIDER_SITE_OTHER): Payer: Medicare Other | Admitting: *Deleted

## 2017-08-24 DIAGNOSIS — R001 Bradycardia, unspecified: Secondary | ICD-10-CM | POA: Diagnosis not present

## 2017-08-24 NOTE — Progress Notes (Signed)
Remote pacemaker transmission.   

## 2017-08-25 ENCOUNTER — Encounter: Payer: Self-pay | Admitting: Cardiology

## 2017-08-25 LAB — CUP PACEART REMOTE DEVICE CHECK
Date Time Interrogation Session: 20190610145450
Implantable Lead Implant Date: 20110531
Implantable Lead Location: 753860
Implantable Lead Serial Number: 484940
Implantable Pulse Generator Implant Date: 20110531
Lead Channel Impedance Value: 0 Ohm
Lead Channel Impedance Value: 465 Ohm
Lead Channel Pacing Threshold Pulse Width: 0.4 ms
MDC IDC MSMT BATTERY IMPEDANCE: 4251 Ohm
MDC IDC MSMT BATTERY REMAINING LONGEVITY: 12 mo
MDC IDC MSMT BATTERY VOLTAGE: 2.7 V
MDC IDC MSMT LEADCHNL RV PACING THRESHOLD AMPLITUDE: 0.75 V
MDC IDC SET LEADCHNL RV PACING AMPLITUDE: 2.5 V
MDC IDC SET LEADCHNL RV PACING PULSEWIDTH: 0.4 ms
MDC IDC SET LEADCHNL RV SENSING SENSITIVITY: 2.8 mV
MDC IDC STAT BRADY RV PERCENT PACED: 100 %

## 2017-11-23 ENCOUNTER — Encounter: Payer: Self-pay | Admitting: Cardiology

## 2017-11-23 ENCOUNTER — Ambulatory Visit (INDEPENDENT_AMBULATORY_CARE_PROVIDER_SITE_OTHER): Payer: Medicare Other | Admitting: *Deleted

## 2017-11-23 DIAGNOSIS — R001 Bradycardia, unspecified: Secondary | ICD-10-CM

## 2017-11-23 NOTE — Progress Notes (Signed)
Remote pacemaker transmission.   

## 2017-12-12 LAB — CUP PACEART REMOTE DEVICE CHECK
Battery Impedance: 4664 Ohm
Battery Remaining Longevity: 10 mo
Battery Voltage: 2.68 V
Implantable Lead Implant Date: 20110531
Implantable Lead Location: 753860
Implantable Lead Model: 4471
Implantable Lead Serial Number: 484940
Implantable Pulse Generator Implant Date: 20110531
Lead Channel Impedance Value: 476 Ohm
Lead Channel Pacing Threshold Pulse Width: 0.4 ms
Lead Channel Setting Pacing Amplitude: 2.5 V
Lead Channel Setting Sensing Sensitivity: 2.8 mV
MDC IDC MSMT LEADCHNL RA IMPEDANCE VALUE: 0 Ohm
MDC IDC MSMT LEADCHNL RV PACING THRESHOLD AMPLITUDE: 0.875 V
MDC IDC SESS DTM: 20190909121816
MDC IDC SET LEADCHNL RV PACING PULSEWIDTH: 0.4 ms
MDC IDC STAT BRADY RV PERCENT PACED: 100 %

## 2018-02-01 ENCOUNTER — Ambulatory Visit: Payer: Medicare Other | Admitting: Internal Medicine

## 2018-02-01 ENCOUNTER — Encounter: Payer: Self-pay | Admitting: Internal Medicine

## 2018-02-01 VITALS — BP 136/80 | HR 67 | Ht 69.0 in | Wt 148.2 lb

## 2018-02-01 DIAGNOSIS — Z950 Presence of cardiac pacemaker: Secondary | ICD-10-CM

## 2018-02-01 DIAGNOSIS — I482 Chronic atrial fibrillation, unspecified: Secondary | ICD-10-CM

## 2018-02-01 DIAGNOSIS — R001 Bradycardia, unspecified: Secondary | ICD-10-CM

## 2018-02-01 NOTE — Patient Instructions (Addendum)
Your physician recommends that you continue on your current medications as directed. Please refer to the Current Medication list given to you today.   Your physician wants you to follow-up in: 6 MONTHS WITH DR KLEIN You will receive a reminder letter in the mail two months in advance. If you don't receive a letter, please call our office to schedule the follow-up appointment.  

## 2018-02-01 NOTE — Progress Notes (Addendum)
Patient Care Team: [name], MD as PCP - General (Internal Medicine)   HPI  Manuel Lewis is a 84 y.o. male Seen to followup for  pacemaker care for single-chamber device implanted in May 2011 for bradycardia in the setting of permanent atrial fibrillation. Thromboembolic risk factors are notable for age-47 and hypertension for a CHADS VASC score of 3      Date Cr K Hgb  3/18 0.82  11.8  1/19  1.04 3.9 12.9        The patient denies chest pain, shortness of breath, nocturnal dyspnea, orthopnea or peripheral edema.  There have been no palpitations, lightheadedness or syncope.   They told me of their meeting Insurance claims handler and Bart//// music- piano playing sentimental journey)    Past Medical History:  Diagnosis Date  . Chronic a-fib    since early 1990's with profound bradycardia and pauses; s/p  implantation  of Medtronic Adapta [id number], serial # [id number]  . Complication of anesthesia 1998   bradycardia  . Dysrhythmia   . Hypertension 1990  . Inguinal hernia   . Macular degeneration   . Nocturia   . Pacemaker   . Prostate cancer ([place]) 1998   with seed implantation    Past Surgical History:  Procedure Laterality Date  . APPENDECTOMY  1941  . CARDIOVERSION     IN THE 1990's  . CATARACT EXTRACTION     2000 and 2007/ h/o macular degeneration since 2009  . CHOLECYSTECTOMY N/A 05/18/2016   Procedure: LAPAROSCOPIC CHOLECYSTECTOMY WITH INTRAOPERATIVE CHOLANGIOGRAM;  Surgeon: Alphonsa Overall, MD;  Location: [facility];  Service: General;  Laterality: N/A;  . ERCP N/A 05/21/2016   Procedure: ENDOSCOPIC RETROGRADE CHOLANGIOPANCREATOGRAPHY (ERCP);  Surgeon: Ladene Artist, MD;  Location: [facility];  Service: Endoscopy;  Laterality: N/A;  . [place]  . HERNIA REPAIR  08/18/11   inguinal  . INGUINAL HERNIA REPAIR  08/18/2011   Procedure: HERNIA REPAIR INGUINAL ADULT;  Surgeon: [name], MD;  Location: [facility];  Service: General;  Laterality: Left;  Recurrent  Left inguinal hernia repair  . INGUINAL HERNIA REPAIR Right 04/02/2017   Procedure: OPEN RIGHT INGUINAL HERNIA REPAIR;  Surgeon: Alphonsa Overall, MD;  Location: [facility];  Service: General;  Laterality: Right;  . INSERT / REPLACE / REMOVE PACEMAKER  08/14/09   IMPLANTATION OF SINGLE CHAMBER PULSE  GENERATING SYSTEM;   MODEL ADSR01, SERIAL # [id number] H ADAPTA  . INSERTION OF MESH Right 04/02/2017   Procedure: INSERTION OF MESH;  Surgeon: Alphonsa Overall, MD;  Location: [facility];  Service: General;  Laterality: Right;  . THYROIDECTOMY, PARTIAL  1960   benign nodule  . TONSILLECTOMY    . US ECHOCARDIOGRAPHY  07/26/2009   EF 55-60%    Current Outpatient Medications  Medication Sig Dispense Refill  . amLODipine (NORVASC) 5 MG tablet Take 5 mg by mouth daily.  3  . beta carotene w/minerals (OCUVITE) tablet Take 1 tablet by mouth daily with breakfast.    . calcium-vitamin D (OSCAL WITH D) 500-200 MG-UNIT tablet Take 1 tablet by mouth at bedtime.    . hydrochlorothiazide (,MICROZIDE/HYDRODIURIL,) 12.5 MG capsule Take 12.5 mg by mouth daily with breakfast.     . losartan (COZAAR) 100 MG tablet Take 100 mg by mouth daily with breakfast.     . warfarin (COUMADIN) 2 MG tablet Take 1-2 mg by mouth every evening. Takes 1 mg everyday except on 2 mg  on Monday's     No current facility-administered medications for this visit.     No Known Allergies  Review of Systems negative except from HPI and PMH  Physical Exam BP 136/80   Pulse 67   Ht 5\' 9"  (1.753 m)   Wt 148 lb 3.2 oz (67.2 kg)   SpO2 91%   BMI 21.89 kg/m  Well developed and nourished in no acute distress HENT normal Neck supple with JVP-flat Clear Regular rate and rhythm, no murmurs or gallops Abd-soft with active BS No Clubbing cyanosis edema Skin-warm and dry A & Oriented  Grossly normal sensory and motor function   ECG atrial fibrillation with complete heart block and V pacing @ 67    Assessment  and  Plan  Complete heart block stable post pacing  Atrial fibrillation-permanent   pacemaker-Medtronic The patient's device was interrogated.  The information was reviewed. No changes were made in the programming.     Hypertension  BP well controlled  On Anticoagulation;  No bleeding issues   Will check labs on diuretics and anticoagulation

## 2018-02-02 ENCOUNTER — Encounter: Payer: Medicare Other | Admitting: Internal Medicine

## 2018-02-02 LAB — BASIC METABOLIC PANEL
BUN/Creatinine Ratio: 16 (ref 10–24)
BUN: 16 mg/dL (ref 10–36)
CALCIUM: 10.2 mg/dL (ref 8.6–10.2)
CO2: 18 mmol/L — ABNORMAL LOW (ref 20–29)
Chloride: 101 mmol/L (ref 96–106)
Creatinine, Ser: 0.97 mg/dL (ref 0.76–1.27)
GFR calc Af Amer: 77 mL/min/{1.73_m2} (ref >59)
GFR calc non Af Amer: 67 mL/min/{1.73_m2} (ref >59)
Glucose: 103 mg/dL — ABNORMAL HIGH (ref 65–99)
POTASSIUM: 4 mmol/L (ref 3.5–5.2)
Sodium: 147 mmol/L — ABNORMAL HIGH (ref 134–144)

## 2018-02-02 LAB — CBC
HEMOGLOBIN: 12.5 g/dL — AB (ref 13.0–17.7)
Hematocrit: 36.6 % — ABNORMAL LOW (ref 37.5–51.0)
MCH: 33 pg (ref 26.6–33.0)
MCHC: 34.2 g/dL (ref 31.5–35.7)
MCV: 97 fL (ref 79–97)
Platelets: 291 10*3/uL (ref 150–450)
RBC: 3.79 x10E6/uL — AB (ref 4.14–5.80)
RDW: 12.4 % (ref 12.3–15.4)
WBC: 5.9 10*3/uL (ref 3.4–10.8)

## 2018-02-16 LAB — CUP PACEART INCLINIC DEVICE CHECK
Battery Remaining Longevity: 8 mo
Battery Voltage: 2.66 V
Date Time Interrogation Session: 20191118171159
Implantable Lead Location: 753860
Implantable Lead Serial Number: 484940
Lead Channel Impedance Value: 0 Ohm
Lead Channel Impedance Value: 485 Ohm
Lead Channel Pacing Threshold Pulse Width: 0.4 ms
Lead Channel Setting Pacing Amplitude: 2.5 V
MDC IDC LEAD IMPLANT DT: 20110531
MDC IDC MSMT BATTERY IMPEDANCE: 5089 Ohm
MDC IDC MSMT LEADCHNL RV PACING THRESHOLD AMPLITUDE: 0.75 V
MDC IDC PG IMPLANT DT: 20110531
MDC IDC SET LEADCHNL RV PACING PULSEWIDTH: 0.4 ms
MDC IDC SET LEADCHNL RV SENSING SENSITIVITY: 2.8 mV
MDC IDC STAT BRADY RV PERCENT PACED: 100 %

## 2018-02-22 ENCOUNTER — Ambulatory Visit (INDEPENDENT_AMBULATORY_CARE_PROVIDER_SITE_OTHER): Payer: Medicare Other

## 2018-02-22 ENCOUNTER — Telehealth: Payer: Self-pay | Admitting: Internal Medicine

## 2018-02-22 DIAGNOSIS — R001 Bradycardia, unspecified: Secondary | ICD-10-CM

## 2018-02-22 NOTE — Telephone Encounter (Signed)
New Message    1. Has your device fired? no  2. Is you device beeping? no  3. Are you experiencing draining or swelling at device site? no  4. Are you calling to see if we received your device transmission? yes  5. Have you passed out? No   Patients wife is calling to see if remote transmission is received. Please call.  Please route to [place]

## 2018-02-22 NOTE — Telephone Encounter (Signed)
Informed Manuel Lewis that patient's remote was received. Estimated longevity 6 months (range: <1-61months). Stable lead measurements. Normal device function. Manuel Lewis verbalized understanding and appreciation of information.

## 2018-02-23 NOTE — Progress Notes (Signed)
Remote pacemaker transmission.   

## 2018-04-07 LAB — CUP PACEART REMOTE DEVICE CHECK
Battery Impedance: 5515 Ohm
Date Time Interrogation Session: 20191209142128
Implantable Lead Serial Number: 484940
Implantable Pulse Generator Implant Date: 20110531
Lead Channel Impedance Value: 0 Ohm
Lead Channel Impedance Value: 481 Ohm
Lead Channel Pacing Threshold Pulse Width: 0.4 ms
MDC IDC LEAD IMPLANT DT: 20110531
MDC IDC LEAD LOCATION: 753860
MDC IDC MSMT BATTERY REMAINING LONGEVITY: 6 mo
MDC IDC MSMT BATTERY VOLTAGE: 2.66 V
MDC IDC MSMT LEADCHNL RV PACING THRESHOLD AMPLITUDE: 0.75 V
MDC IDC SET LEADCHNL RV PACING AMPLITUDE: 2.5 V
MDC IDC SET LEADCHNL RV PACING PULSEWIDTH: 0.4 ms
MDC IDC SET LEADCHNL RV SENSING SENSITIVITY: 2.8 mV
MDC IDC STAT BRADY RV PERCENT PACED: 100 %

## 2018-04-26 ENCOUNTER — Emergency Department (HOSPITAL_COMMUNITY)
Admission: EM | Admit: 2018-04-26 | Discharge: 2018-04-26 | Disposition: A | Discharge status: Home / Self Care | Payer: Medicare Other | Attending: Emergency Medicine | Admitting: Emergency Medicine

## 2018-04-26 ENCOUNTER — Encounter (HOSPITAL_COMMUNITY): Payer: Self-pay

## 2018-04-26 ENCOUNTER — Other Ambulatory Visit: Payer: Self-pay

## 2018-04-26 DIAGNOSIS — Y9301 Activity, walking, marching and hiking: Secondary | ICD-10-CM | POA: Insufficient documentation

## 2018-04-26 DIAGNOSIS — Y999 Unspecified external cause status: Secondary | ICD-10-CM | POA: Diagnosis not present

## 2018-04-26 DIAGNOSIS — I10 Essential (primary) hypertension: Secondary | ICD-10-CM | POA: Diagnosis not present

## 2018-04-26 DIAGNOSIS — W101XXA Fall (on)(from) sidewalk curb, initial encounter: Secondary | ICD-10-CM | POA: Diagnosis not present

## 2018-04-26 DIAGNOSIS — Z79899 Other long term (current) drug therapy: Secondary | ICD-10-CM | POA: Insufficient documentation

## 2018-04-26 DIAGNOSIS — S61412A Laceration without foreign body of left hand, initial encounter: Secondary | ICD-10-CM | POA: Diagnosis not present

## 2018-04-26 DIAGNOSIS — Z8546 Personal history of malignant neoplasm of prostate: Secondary | ICD-10-CM | POA: Insufficient documentation

## 2018-04-26 DIAGNOSIS — S6992XA Unspecified injury of left wrist, hand and finger(s), initial encounter: Secondary | ICD-10-CM | POA: Diagnosis present

## 2018-04-26 DIAGNOSIS — Z7901 Long term (current) use of anticoagulants: Secondary | ICD-10-CM | POA: Insufficient documentation

## 2018-04-26 DIAGNOSIS — Y929 Unspecified place or not applicable: Secondary | ICD-10-CM | POA: Diagnosis not present

## 2018-04-26 DIAGNOSIS — Z950 Presence of cardiac pacemaker: Secondary | ICD-10-CM | POA: Diagnosis not present

## 2018-04-26 DIAGNOSIS — S61411A Laceration without foreign body of right hand, initial encounter: Secondary | ICD-10-CM

## 2018-04-26 NOTE — Discharge Instructions (Addendum)
WOUND CARE n  Keep area clean and dry for 24 hours. Do not remove bandage, if applied.  After 24 hours, remove bandage and wash wound gently with mild soap and warm water. Reapply a new bandage after cleaning wound, if directed.  Continue daily cleansing with soap and water until stitches/staples are removed.  Do not apply any ointments or creams to the wound while stitches/staples are in place, as this may cause delayed healing.  Seek medical careif you experience any of the following signs of infection: Swelling, redness, pus drainage, streaking, fever >101.0 F  Seek care if you experience excessive bleeding that does not stop after 15-20 minutes of constant, firm pressure.

## 2018-04-26 NOTE — ED Triage Notes (Signed)
Pt reports that he was at the doc office when he had a mechanical fall onto the curb. Pt has skin tears to bilateral hands. Pt denies head injury. Pt reports minimal pain to abrasions. Bleeding controlled at this time.

## 2018-04-26 NOTE — ED Notes (Signed)
Pt has 2 laceration skin tears  noted to bilateral palms of hands. Pt reports 6/10 burning pain. Area cleansed with normal saline, clean dressing applied. Secured with coban. Pt reports that he is on coumadin.

## 2018-04-26 NOTE — ED Provider Notes (Signed)
[facility] DEPT Provider Note   CSN: [id number] Arrival date & time: 04/26/18  1503     History   Chief Complaint Chief Complaint  Patient presents with  . Extremity Laceration    HPI Manuel Lewis is a 85 y.o. male who presents emergency department for lacerations.  Patient was leaving the eye doctor when he tripped over his feet and fell forward onto outstretched hands onto the concrete and suffered skin tears of the bilateral thenar eminence's.  Patient is on Coumadin but bleeding has subsided.  He denies any pain in his joints.  He did not hit his head or lose consciousness.  This or tingling in his fingers.  HPI  Past Medical History:  Diagnosis Date  . Chronic a-fib    since early 1990's with profound bradycardia and pauses; s/p  implantation  of Medtronic Adapta [id number], serial # [id number]  . Complication of anesthesia 1998   bradycardia  . Dysrhythmia   . Hypertension 1990  . Inguinal hernia   . Macular degeneration   . Nocturia   . Pacemaker   . Prostate cancer ([place]) 1998   with seed implantation    Patient Active Problem List   Diagnosis Date Noted  . Common bile duct (CBD) obstruction   . Abnormal cholangiogram   . Gallstones   . Elevated LFTs   . Acute cholecystitis 05/16/2016  . Edema 06/30/2012  . Hypertension 08/06/2010  . Atrial fibrillation ([place]) 08/06/2010  . Bradycardia 08/06/2010  . Pacemaker-Single chamber-MDT 08/06/2010  . Complication of anesthesia 03/17/1996    Past Surgical History:  Procedure Laterality Date  . APPENDECTOMY  1941  . CARDIOVERSION     IN THE 1990's  . CATARACT EXTRACTION     2000 and 2007/ h/o macular degeneration since 2009  . CHOLECYSTECTOMY N/A 05/18/2016   Procedure: LAPAROSCOPIC CHOLECYSTECTOMY WITH INTRAOPERATIVE CHOLANGIOGRAM;  Surgeon: Alphonsa Overall, MD;  Location[facility];  Service: General;  Laterality: N/A;  . ERCP N/A 05/21/2016   Procedure: ENDOSCOPIC RETROGRADE  CHOLANGIOPANCREATOGRAPHY (ERCP);  Surgeon: Ladene Artist, MD;  Location: [facility];  Service: Endoscopy;  Laterality: N/A;  . [place]  . HERNIA REPAIR  08/18/11   inguinal  . INGUINAL HERNIA REPAIR  08/18/2011   Procedure: HERNIA REPAIR INGUINAL ADULT;  Surgeon: [name], MD;  Location: [facility];  Service: General;  Laterality: Left;  Recurrent Left inguinal hernia repair  . INGUINAL HERNIA REPAIR Right 04/02/2017   Procedure: OPEN RIGHT INGUINAL HERNIA REPAIR;  Surgeon: Alphonsa Overall, MD;  Location: [facility];  Service: General;  Laterality: Right;  . INSERT / REPLACE / REMOVE PACEMAKER  08/14/09   IMPLANTATION OF SINGLE CHAMBER PULSE  GENERATING SYSTEM;   MODEL ADSR01, SERIAL # [id number] H ADAPTA  . INSERTION OF MESH Right 04/02/2017   Procedure: INSERTION OF MESH;  Surgeon: Alphonsa Overall, MD;  Location: [facility];  Service: General;  Laterality: Right;  . THYROIDECTOMY, PARTIAL  1960   benign nodule  . TONSILLECTOMY    . US ECHOCARDIOGRAPHY  07/26/2009   EF 55-60%        Home Medications    Prior to Admission medications   Medication Sig Start Date End Date Taking? Authorizing Provider  amLODipine (NORVASC) 5 MG tablet Take 5 mg by mouth daily. 12/16/17   [provider]  beta carotene w/minerals (OCUVITE) tablet Take 1 tablet by mouth daily with breakfast.  [provider]  calcium-vitamin D (OSCAL WITH D) 500-200 MG-UNIT tablet Take 1 tablet by mouth at bedtime.    [provider]  hydrochlorothiazide (,MICROZIDE/HYDRODIURIL,) 12.5 MG capsule Take 12.5 mg by mouth daily with breakfast.     [provider]  losartan (COZAAR) 100 MG tablet Take 100 mg by mouth daily with breakfast.     [provider]  warfarin (COUMADIN) 2 MG tablet Take 1-2 mg by mouth every evening. Takes 1 mg everyday except on 2 mg on Monday's    [provider]    Family History Family History    Problem Relation Age of Onset  . Stroke Mother   . Kidney failure Father     Social History Social History   Tobacco Use  . Smoking status: Never Smoker  . Smokeless tobacco: Never Used  Substance Use Topics  . Alcohol use: Yes    Alcohol/week: 7.0 standard drinks    Types: 7 Glasses of wine per week    Comment: 1 GLASS OF WINE NIGHTLY  . Drug use: No     Allergies   Patient has no known allergies.   Review of Systems Review of Systems  Skin: Positive for wound.  Neurological: Negative for dizziness, syncope, weakness, numbness and headaches.  Hematological: Bruises/bleeds easily.     Physical Exam Updated Vital Signs BP (!) 156/80   Pulse 62   Temp 97.7 F (36.5 C) (Oral)   Resp 18   Wt 65.8 kg   SpO2 99%   BMI 21.41 kg/m   Physical Exam Vitals signs and nursing note reviewed.  Constitutional:      General: He is not in acute distress.    Appearance: He is well-developed. He is not diaphoretic.  HENT:     Head: Normocephalic and atraumatic.  Eyes:     General: No scleral icterus.    Conjunctiva/sclera: Conjunctivae normal.  Neck:     Musculoskeletal: Normal range of motion and neck supple.  Cardiovascular:     Rate and Rhythm: Normal rate and regular rhythm.     Heart sounds: Normal heart sounds.  Pulmonary:     Effort: Pulmonary effort is normal. No respiratory distress.     Breath sounds: Normal breath sounds.  Abdominal:     Palpations: Abdomen is soft.     Tenderness: There is no abdominal tenderness.  Skin:    General: Skin is warm and dry.     Comments: 4 cm skin tear over the left thenar eminence, 1 cm skin care over the right thenar eminence.  Patient has full range of motion's of each joint within the finger, wrists elbows and shoulders bilaterally. Normal radial pulse and sensation in the fingertips.  Neurological:     Mental Status: He is alert.     Gait: Gait abnormal.  Psychiatric:        Behavior: Behavior normal.   ;   ED  Treatments / Results  Labs (all labs ordered are listed, but only abnormal results are displayed) Labs Reviewed - No data to display  EKG None  Radiology No results found.  Procedures Procedures (including critical care time) LACERATION REPAIR Performed by: Margarita Mail Authorized by: Margarita Mail Consent: Verbal consent obtained. Risks and benefits: risks, benefits and alternatives were discussed Consent given by: patient Patient identity confirmed: provided demographic data Prepped and Draped in normal sterile fashion Wound explored  Laceration Location: Left hand  Laceration Length: 4cm  No Foreign Bodies seen or palpated  Anesthesia: local infiltration  Local anesthetic: lidocaine 1% w/o epinephrine  Anesthetic total: 6 ml  Irrigation method: syringe Amount of cleaning: standard  Skin closure: 4.0 chromic gut  Number of sutures: 8  Technique: SI  Patient tolerance: Patient tolerated the procedure well with no immediate complications.  LACERATION REPAIR Performed by: Margarita Mail Authorized by: Margarita Mail Consent: Verbal consent obtained. Risks and benefits: risks, benefits and alternatives were discussed Consent given by: patient Patient identity confirmed: provided demographic data Prepped and Draped in normal sterile fashion Wound explored  Laceration Location: left hand  Laceration Length: 2 cm  No Foreign Bodies seen or palpated  Anesthesia: local infiltration  Local anesthetic: lidocaine 1% w/o epinephrine  Anesthetic total: 4 ml  Irrigation method: syringe Amount of cleaning: standard  Skin closure: 4.0 chromic gut  Number of sutures: 4  Technique: SI  Patient tolerance: Patient tolerated the procedure well with no immediate complications.  Medications Ordered in ED Medications - No data to display   Initial Impression / Assessment and Plan / ED Course  I have reviewed the triage vital signs and the nursing  notes.  Pertinent labs & imaging results that were available during my care of the patient were reviewed by me and considered in my medical decision making (see chart for details).    Manuel Lewis is a 85 y.o. male who presents to ED for laceration of both hands. EMR reviewed. Hx given by the patient, his wife and daughter who are at bedside. Wound thoroughly cleaned in ED today. Wound explored and bottom of wound seen in a bloodless field. Laceration repaired as dictated above. Patient counseled on home wound care. Follow up with PCP/urgent care or return to ER for  Concerns. Patient was urged to return to the Emergency Department for worsening pain, swelling, expanding erythema especially if it streaks away from the affected area, fever, or for any additional concerns. Patient seen and discussed with Dr. Lita Mains.  Patient verbalized understanding. No imaging necessary. Patient is UTC on his meds. All questions answered. Pt feels improved after observation and/or treatment in ED.   l  Final Clinical Impressions(s) / ED Diagnoses   Final diagnoses:  Laceration of left hand without foreign body, initial encounter  Laceration of right hand without foreign body, initial encounter    ED Discharge Orders    None       Margarita Mail, PA-C 04/26/18 1808    Julianne Rice, MD 04/26/18 [phone]

## 2018-04-26 NOTE — ED Notes (Signed)
Pt is alert and oriented x 4 and is ambulating the halls at this time. Pt

## 2018-04-27 ENCOUNTER — Other Ambulatory Visit: Payer: Self-pay

## 2018-04-27 ENCOUNTER — Ambulatory Visit (HOSPITAL_COMMUNITY)
Admission: EM | Admit: 2018-04-27 | Discharge: 2018-04-27 | Disposition: A | Discharge status: Home / Self Care | Payer: Medicare Other | Attending: Family Medicine | Admitting: Family Medicine

## 2018-04-27 ENCOUNTER — Encounter (HOSPITAL_COMMUNITY): Payer: Self-pay

## 2018-04-27 DIAGNOSIS — Z5189 Encounter for other specified aftercare: Secondary | ICD-10-CM

## 2018-04-27 DIAGNOSIS — S61411A Laceration without foreign body of right hand, initial encounter: Secondary | ICD-10-CM

## 2018-04-27 DIAGNOSIS — W19XXXA Unspecified fall, initial encounter: Secondary | ICD-10-CM

## 2018-04-27 DIAGNOSIS — S61412A Laceration without foreign body of left hand, initial encounter: Secondary | ICD-10-CM

## 2018-04-27 NOTE — Discharge Instructions (Addendum)
Keep your wound clean and as dry as possible. Do not immerse or soak your wound in water. This means no swimming, washing dishes (unless thick rubber gloves are used), baths, or hot tubs until the stitches are removed or dissolve. Leave original bandages on for the first 24 hours. After this time, showering or rinsing is recommended, rather than bathing. After the first 24 hours, remove old bandages and gently cleanse your wound with soap and water.

## 2018-04-27 NOTE — ED Notes (Signed)
Patient able to ambulate independently  

## 2018-04-27 NOTE — ED Triage Notes (Signed)
Pt presents to [facility] for wound check and bandage change

## 2018-04-28 NOTE — ED Provider Notes (Signed)
[facility]   [id number] 04/27/18 Arrival Time: 6063  ASSESSMENT & PLAN:  1. Visit for wound check    Bilateral hand/palm lacerations. Wounds look good. No bleeding. Sutures intact. No signs of infection. Bandaged by RN.    Discharge Instructions     Keep your wound clean and as dry as possible. Do not immerse or soak your wound in water. This means no swimming, washing dishes (unless thick rubber gloves are used), baths, or hot tubs until the stitches are removed or dissolve. Leave original bandages on for the first 24 hours. After this time, showering or rinsing is recommended, rather than bathing. After the first 24 hours, remove old bandages and gently cleanse your wound with soap and water.    Reviewed expectations re: course of current medical issues. Questions answered. Outlined signs and symptoms indicating need for more acute intervention. Patient verbalized understanding. After Visit Summary given.   SUBJECTIVE:  Manuel Lewis is a 85 y.o. male who presents with bilateral hand lacerations after a fall. Repaired in the ED yesterday. Daughter would like wounds checked. Would also like to be shown how to clean and re-bandage. No pain. No bleeding. He is ambulatory without assistance. He has no concerns.  ROS: As per HPI.   OBJECTIVE:  Vitals:   04/27/18 1830 04/27/18 1833  BP: (!) 153/68   Pulse: 81   Resp: 17   Temp: (!) 97.5 F (36.4 C)   TempSrc: Oral   SpO2: 100% 100%     General appearance: alert; no distress Skin: bilateral palmar lacerations of both hands; sutures intact; without active draining; without active bleeding; FROM of bilateral wrists and all fingers without difficulty; normal capillary refill; normal distal sensation of both hands Psychological: alert and cooperative; normal mood and affect  No Known Allergies  Past Medical History:  Diagnosis Date  . Chronic a-fib    since early 1990's with profound bradycardia and pauses;  s/p  implantation  of Medtronic Adapta [id number], serial # [id number] H  . Complication of anesthesia 1998   bradycardia  . Dysrhythmia   . Hypertension 1990  . Inguinal hernia   . Macular degeneration   . Nocturia   . Pacemaker   . Prostate cancer ([place]) 1998   with seed implantation   Social History   Socioeconomic History  . Marital status: Married    Spouse name: Not on file  . Number of children: 1  . Years of education: Not on file  . Highest education level: Not on file  Occupational History  . 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 Financial resource strain: Not on file  . Food insecurity:    Worry: Not on file    Inability: Not on file  . Transportation needs:    Medical: Not on file    Non-medical: Not on file  Tobacco Use  . Smoking status: Never Smoker  . Smokeless tobacco: Never Used  Substance and Sexual Activity  . Alcohol use: Yes    Alcohol/week: 7.0 standard drinks    Types: 7 Glasses of wine per week    Comment: 1 GLASS OF WINE NIGHTLY  . Drug use: No  . Sexual activity: Never  Lifestyle  . Physical activity:    Days per week: Not on file    Minutes per session: Not on file  . Stress: Not on file  Relationships  . Social connections:    Talks on  phone: Not on file    Gets together: Not on file    Attends religious service: Not on file    Active member of club or organization: Not on file    Attends meetings of clubs or organizations: Not on file    Relationship status: Not on file  Other Topics Concern  . Not on file  Social History Narrative   MARRIED   SON IS DR. DAVID Panik   RETIRED [employer] ENGINEER   NEVER SMOKED   1 GLASS OF WINE NIGHTLY   HE HAD ALSO BEEN A PILOT AND FLIGHT Geanie Logan, MD 04/28/18 [phone]

## 2018-05-24 ENCOUNTER — Ambulatory Visit (INDEPENDENT_AMBULATORY_CARE_PROVIDER_SITE_OTHER): Payer: Medicare Other | Admitting: *Deleted

## 2018-05-24 DIAGNOSIS — R001 Bradycardia, unspecified: Secondary | ICD-10-CM

## 2018-05-24 DIAGNOSIS — I482 Chronic atrial fibrillation, unspecified: Secondary | ICD-10-CM | POA: Diagnosis not present

## 2018-05-25 ENCOUNTER — Telehealth: Payer: Self-pay | Admitting: *Deleted

## 2018-05-25 LAB — CUP PACEART REMOTE DEVICE CHECK
Battery Impedance: 7367 Ohm
Battery Remaining Longevity: 1 mo — CL
Battery Voltage: 2.6 V
Brady Statistic RV Percent Paced: 100 %
Implantable Lead Implant Date: 20110531
Implantable Lead Location: 753860
Implantable Lead Model: 4471
Implantable Lead Serial Number: 484940
Lead Channel Impedance Value: 449 Ohm
Lead Channel Pacing Threshold Pulse Width: 0.4 ms
Lead Channel Setting Pacing Pulse Width: 0.4 ms
Lead Channel Setting Sensing Sensitivity: 2.8 mV
MDC IDC MSMT LEADCHNL RA IMPEDANCE VALUE: 0 Ohm
MDC IDC MSMT LEADCHNL RV PACING THRESHOLD AMPLITUDE: 0.625 V
MDC IDC PG IMPLANT DT: 20110531
MDC IDC SESS DTM: 20200309123524
MDC IDC SET LEADCHNL RV PACING AMPLITUDE: 2.5 V

## 2018-05-25 NOTE — Telephone Encounter (Signed)
Remote transmission from 05/24/18 shows PPM battery nearing ERI, but not yet tripped ERI (<65month estimated).   Spoke with patient. He is aware of findings, otherwise normal PPM function. Advised of VVI 65bpm when ERI is tripped. Pt agrees to send next transmission on 06/23/18 for monthly battery check. He denies additional questions or concerns at this time.

## 2018-05-31 NOTE — Progress Notes (Signed)
Remote pacemaker transmission.   

## 2018-06-23 ENCOUNTER — Other Ambulatory Visit: Payer: Self-pay

## 2018-06-23 ENCOUNTER — Ambulatory Visit (INDEPENDENT_AMBULATORY_CARE_PROVIDER_SITE_OTHER): Payer: Medicare Other | Admitting: *Deleted

## 2018-06-23 ENCOUNTER — Telehealth: Payer: Self-pay | Admitting: Internal Medicine

## 2018-06-23 DIAGNOSIS — I482 Chronic atrial fibrillation, unspecified: Secondary | ICD-10-CM

## 2018-06-23 DIAGNOSIS — R001 Bradycardia, unspecified: Secondary | ICD-10-CM

## 2018-06-23 DIAGNOSIS — I4891 Unspecified atrial fibrillation: Secondary | ICD-10-CM

## 2018-06-23 LAB — CUP PACEART REMOTE DEVICE CHECK
Battery Impedance: 8947 Ohm
Battery Voltage: 2.59 V
Brady Statistic RV Percent Paced: 100 %
Date Time Interrogation Session: 20200408122037
Implantable Lead Implant Date: 20110531
Implantable Lead Location: 753860
Implantable Lead Model: 4471
Implantable Lead Serial Number: 484940
Implantable Pulse Generator Implant Date: 20110531
Lead Channel Impedance Value: 0 Ohm
Lead Channel Impedance Value: 453 Ohm
Lead Channel Setting Pacing Amplitude: 2.5 V
Lead Channel Setting Pacing Pulse Width: 0.4 ms
Lead Channel Setting Sensing Sensitivity: 2.8 mV

## 2018-06-23 NOTE — Telephone Encounter (Signed)
Per pt call needs a call back please to confirm the office received it he has been having trouble machine.

## 2018-06-23 NOTE — Telephone Encounter (Signed)
Transmission received - device at [facility] since 06/14/18. Routed to Dr Caryl Comes and Rolanda Lundborg for timing of gen change  Chanetta Marshall, NP 06/23/2018 9:01 AM

## 2018-06-28 NOTE — Telephone Encounter (Signed)
L  Give him a call please  He will have reverted to VVI without rate response, so the question would be exercise tolerance and this would inform change out timing thnaks

## 2018-06-29 NOTE — Telephone Encounter (Signed)
Pt states he is feeling fine. He exercises daily with no SOB, CP, or fatigue. He understands I will be calling him to schedule his PPM generator change in the next few weeks. He will call the office if anything changes.

## 2018-07-01 NOTE — Progress Notes (Signed)
Remote pacemaker transmission.   

## 2018-07-05 ENCOUNTER — Telehealth: Payer: Self-pay | Admitting: Internal Medicine

## 2018-07-05 NOTE — Telephone Encounter (Signed)
LVM for return call. 

## 2018-07-05 NOTE — Telephone Encounter (Signed)
New Message:    Pt's son is calling. He wants to talk to somebody about pt's battery running low on his pacemaker. He is concerned about this.

## 2018-07-05 NOTE — Telephone Encounter (Signed)
Device at [facility] - will route to [place]/Dr Caryl Comes for timing of gen change

## 2018-07-05 NOTE — Telephone Encounter (Signed)
Device reached ERI 06/14/18     Called and spoke with his son to confirm no change in functional status   Pt is Very HOH and son isnt sure how much he has understood

## 2018-07-20 ENCOUNTER — Other Ambulatory Visit: Payer: Self-pay

## 2018-07-20 ENCOUNTER — Other Ambulatory Visit: Payer: Medicare Other

## 2018-07-20 DIAGNOSIS — I4891 Unspecified atrial fibrillation: Secondary | ICD-10-CM

## 2018-07-20 LAB — PROTIME-INR
INR: 2.2 — ABNORMAL HIGH (ref 0.8–1.2)
Prothrombin Time: 21.8 s — ABNORMAL HIGH (ref 9.1–12.0)

## 2018-07-20 NOTE — Telephone Encounter (Signed)
Pt's INR is 2.2; will call son tomorrow with instructions from Dr Curt Bears.

## 2018-07-20 NOTE — Telephone Encounter (Signed)
Pt has been scheduled for his PPM Generator Change out with Dr Curt Bears, per Dr. Olin Pia recommendation.   Pt will come in for lab work today; BMP, CBC, and PT/INR. The change out instructions have been discussed with Dr Crissie Reese, pt's son. He understands we will call with coumadin instructions, otherwise pt may take his normal medications on the morning of his procedure with a sip of water.   I will call pt's son back with additional instructions after labs have resulted.

## 2018-07-20 NOTE — Addendum Note (Signed)
Addended by: Dollene Primrose on: 07/20/2018 10:15 AM   Modules accepted: Orders

## 2018-07-21 ENCOUNTER — Telehealth: Payer: Self-pay

## 2018-07-21 LAB — CBC
Hematocrit: 36.8 % — ABNORMAL LOW (ref 37.5–51.0)
Hemoglobin: 12.5 g/dL — ABNORMAL LOW (ref 13.0–17.7)
MCH: 32.7 pg (ref 26.6–33.0)
MCHC: 34 g/dL (ref 31.5–35.7)
MCV: 96 fL (ref 79–97)
Platelets: 275 10*3/uL (ref 150–450)
RBC: 3.82 x10E6/uL — ABNORMAL LOW (ref 4.14–5.80)
RDW: 12.6 % (ref 11.6–15.4)
WBC: 5 10*3/uL (ref 3.4–10.8)

## 2018-07-21 LAB — BASIC METABOLIC PANEL
BUN/Creatinine Ratio: 26 — ABNORMAL HIGH (ref 10–24)
BUN: 23 mg/dL (ref 10–36)
CO2: 21 mmol/L (ref 20–29)
Calcium: 9.6 mg/dL (ref 8.6–10.2)
Chloride: 101 mmol/L (ref 96–106)
Creatinine, Ser: 0.9 mg/dL (ref 0.76–1.27)
GFR calc Af Amer: 84 mL/min/{1.73_m2} (ref >59)
GFR calc non Af Amer: 73 mL/min/{1.73_m2} (ref >59)
Glucose: 91 mg/dL (ref 65–99)
Potassium: 4.4 mmol/L (ref 3.5–5.2)
Sodium: 139 mmol/L (ref 134–144)

## 2018-07-21 NOTE — Telephone Encounter (Signed)
Pt is HOH, so I spoke with pt's son (per DPR) regarding pre and post procedural instructions. He agrees to the following:  Arrive at [facility] @ 0730 on May 8. Check in at patient registration and he will be brought back to short stay. No visitors will be allowed to sit with pt while in pre or post procedural area. Pt's son will be contacted prior to his discharge.   Nothing to eat or drink after midnight the night before his procedure.  Hold all medications the morning of his procedure.  Pt is ok to continue his normal dose of coumadin up to the day of his procedure, per Dr Curt Bears.   Pt will shower with CHX scrub the evening before and morning of his procedure.   Pt will go home the same day and will need someone to drive him home.   Pt has scheduled wound check at the [facility] device clinic on May 20th at 0900.    Pt's son had no additional questions concerning his procedure.

## 2018-07-21 NOTE — Telephone Encounter (Signed)
See encounter for 5/6

## 2018-07-23 ENCOUNTER — Encounter (HOSPITAL_COMMUNITY): Payer: Self-pay | Admitting: Cardiology

## 2018-07-23 ENCOUNTER — Ambulatory Visit (HOSPITAL_COMMUNITY)
Admission: RE | Disposition: A | Discharge status: Home / Self Care | Payer: Medicare Other | Source: Home / Self Care | Attending: Cardiology

## 2018-07-23 ENCOUNTER — Other Ambulatory Visit: Payer: Self-pay

## 2018-07-23 ENCOUNTER — Ambulatory Visit: Payer: Medicare Other | Admitting: *Deleted

## 2018-07-23 ENCOUNTER — Ambulatory Visit (HOSPITAL_COMMUNITY)
Admission: RE | Admit: 2018-07-23 | Discharge: 2018-07-23 | Disposition: A | Discharge status: Home / Self Care | Payer: Medicare Other | Attending: Cardiology | Admitting: Cardiology

## 2018-07-23 DIAGNOSIS — Z45010 Encounter for checking and testing of cardiac pacemaker pulse generator [battery]: Secondary | ICD-10-CM

## 2018-07-23 DIAGNOSIS — R001 Bradycardia, unspecified: Secondary | ICD-10-CM | POA: Diagnosis not present

## 2018-07-23 DIAGNOSIS — I10 Essential (primary) hypertension: Secondary | ICD-10-CM | POA: Insufficient documentation

## 2018-07-23 DIAGNOSIS — R351 Nocturia: Secondary | ICD-10-CM | POA: Diagnosis not present

## 2018-07-23 DIAGNOSIS — I4821 Permanent atrial fibrillation: Secondary | ICD-10-CM | POA: Diagnosis not present

## 2018-07-23 DIAGNOSIS — Z950 Presence of cardiac pacemaker: Secondary | ICD-10-CM

## 2018-07-23 HISTORY — PX: PPM GENERATOR CHANGEOUT: EP1233

## 2018-07-23 LAB — SURGICAL PCR SCREEN
MRSA, PCR: NEGATIVE
Staphylococcus aureus: NEGATIVE

## 2018-07-23 LAB — PROTIME-INR
INR: 2 — ABNORMAL HIGH (ref 0.8–1.2)
Prothrombin Time: 22.8 seconds — ABNORMAL HIGH (ref 11.4–15.2)

## 2018-07-23 SURGERY — PPM GENERATOR CHANGEOUT

## 2018-07-23 MED ORDER — CHLORHEXIDINE GLUCONATE 4 % EX LIQD: 60.0000 mL | Freq: Once | CUTANEOUS | Status: DC

## 2018-07-23 MED ORDER — ACETAMINOPHEN 325 MG PO TABS: 325.0000 mg | ORAL_TABLET | ORAL | Status: DC | PRN

## 2018-07-23 MED ORDER — ONDANSETRON HCL 4 MG/2ML IJ SOLN: 4.0000 mg | Freq: Four times a day (QID) | INTRAMUSCULAR | Status: DC | PRN

## 2018-07-23 MED ORDER — CEFAZOLIN SODIUM-DEXTROSE 2-4 GM/100ML-% IV SOLN
INTRAVENOUS | Status: AC
  Filled 2018-07-23: qty 100

## 2018-07-23 MED ORDER — LIDOCAINE HCL 1 % IJ SOLN
INTRAMUSCULAR | Status: AC
  Filled 2018-07-23: qty 60

## 2018-07-23 MED ORDER — MUPIROCIN 2 % EX OINT
TOPICAL_OINTMENT | CUTANEOUS | Status: AC
  Administered 2018-07-23: 08:00:00
  Filled 2018-07-23: qty 22

## 2018-07-23 MED ORDER — LIDOCAINE HCL (PF) 1 % IJ SOLN
INTRAMUSCULAR | Status: DC | PRN
  Administered 2018-07-23: 10:00:00 50 mL

## 2018-07-23 MED ORDER — SODIUM CHLORIDE 0.9 % IV SOLN
INTRAVENOUS | Status: DC
  Administered 2018-07-23: 08:00:00 via INTRAVENOUS

## 2018-07-23 MED ORDER — SODIUM CHLORIDE 0.9 % IV SOLN
80.0000 mg | INTRAVENOUS | Status: AC
  Administered 2018-07-23: 80 mg

## 2018-07-23 MED ORDER — SODIUM CHLORIDE 0.9 % IV SOLN
INTRAVENOUS | Status: AC
  Filled 2018-07-23: qty 2

## 2018-07-23 MED ORDER — CEFAZOLIN SODIUM-DEXTROSE 2-4 GM/100ML-% IV SOLN
2.0000 g | INTRAVENOUS | Status: AC
  Administered 2018-07-23: 2 g via INTRAVENOUS

## 2018-07-23 SURGICAL SUPPLY — 5 items
CABLE SURGICAL S-101-97-12 (CABLE) ×3 IMPLANT
IPG PACE AZUR XT SR MRI W1SR01 (Pacemaker) IMPLANT
PACE AZURE XT SR MRI W1SR01 (Pacemaker) ×3 IMPLANT
PAD PRO RADIOLUCENT 2001M-C (PAD) ×3 IMPLANT
TRAY PACEMAKER INSERTION (PACKS) ×3 IMPLANT

## 2018-07-23 NOTE — Discharge Instructions (Signed)
Pacemaker Battery Change, Care After  This sheet gives you information about how to care for yourself after your procedure. Your health care provider may also give you more specific instructions. If you have problems or questions, contact your health care provider.  What can I expect after the procedure?  After your procedure, it is common to have:   Pain or soreness at the site where the pacemaker was inserted.   Swelling at the site where the pacemaker was inserted.  Follow these instructions at home:  Incision care     Keep the incision clean and dry.  ? Do not take baths, swim, or use a hot tub until your health care provider approves.  ? You may shower the day after your procedure, or as directed by your health care provider.  ? Pat the area dry with a clean towel. Do not rub the area. This may cause bleeding.   Follow instructions from your health care provider about how to take care of your incision. Make sure you:  ? Wash your hands with soap and water before you change your bandage (dressing). If soap and water are not available, use hand sanitizer.  ? Change your dressing as told by your health care provider.  ? Leave stitches (sutures), skin glue, or adhesive strips in place. These skin closures may need to stay in place for 2 weeks or longer. If adhesive strip edges start to loosen and curl up, you may trim the loose edges. Do not remove adhesive strips completely unless your health care provider tells you to do that.   Check your incision area every day for signs of infection. Check for:  ? More redness, swelling, or pain.  ? More fluid or blood.  ? Warmth.  ? Pus or a bad smell.  Activity   Do not lift anything that is heavier than 10 lb (4.5 kg) until your health care provider says it is okay to do so.   For the first 2 weeks, or as long as told by your health care provider:  ? Avoid lifting your left arm higher than your shoulder.  ? Be gentle when you move your arms over your head. It is okay  to raise your arm to comb your hair.  ? Avoid strenuous exercise.   Ask your health care provider when it is okay to:  ? Resume your normal activities.  ? Return to work or school.  ? Resume sexual activity.  Eating and drinking   Eat a heart-healthy diet. This should include plenty of fresh fruits and vegetables, whole grains, low-fat dairy products, and lean protein like chicken and fish.   Limit alcohol intake to no more than 1 drink a day for non-pregnant women and 2 drinks a day for men. One drink equals 12 oz of beer, 5 oz of wine, or 1 oz of hard liquor.   Check ingredients and nutrition facts on packaged foods and beverages. Avoid the following types of food:  ? Food that is high in salt (sodium).  ? Food that is high in saturated fat, like full-fat dairy or red meat.  ? Food that is high in trans fat, like fried food.  ? Food and drinks that are high in sugar.  Lifestyle   Do not use any products that contain nicotine or tobacco, such as cigarettes and e-cigarettes. If you need help quitting, ask your health care provider.   Take steps to manage and control your weight.     Get regular exercise. Aim for 150 minutes of moderate-intensity exercise (such as walking or yoga) or 75 minutes of vigorous exercise (such as running or swimming) each week.   Manage other health problems, such as diabetes or high blood pressure. Ask your health care provider how you can manage these conditions.  General instructions   Do not drive for 24 hours after your procedure if you were given a medicine to help you relax (sedative).   Take over-the-counter and prescription medicines only as told by your health care provider.   Avoid putting pressure on the area where the pacemaker was placed.   If you need an MRI after your pacemaker has been placed, be sure to tell the health care provider who orders the MRI that you have a pacemaker.   Avoid close and prolonged exposure to electrical devices that have strong  magnetic fields. These include:  ? Cell phones. Avoid keeping them in a pocket near the pacemaker, and try using the ear opposite the pacemaker.  ? MP3 players.  ? Household appliances, like microwaves.  ? Metal detectors.  ? Electric generators.  ? High-tension wires.   Keep all follow-up visits as directed by your health care provider. This is important.  Contact a health care provider if:   You have pain at the incision site that is not relieved by over-the-counter or prescription medicines.   You have any of these around your incision site or coming from it:  ? More redness, swelling, or pain.  ? Fluid or blood.  ? Warmth to the touch.  ? Pus or a bad smell.   You have a fever.   You feel brief, occasional palpitations, light-headedness, or any symptoms that you think might be related to your heart.  Get help right away if:   You experience chest pain that is different from the pain at the pacemaker site.   You develop a red streak that extends above or below the incision site.   You experience shortness of breath.   You have palpitations or an irregular heartbeat.   You have light-headedness that does not go away quickly.   You faint or have dizzy spells.   Your pulse suddenly drops or increases rapidly and does not return to normal.   You begin to gain weight and your legs and ankles swell.  Summary   After your procedure, it is common to have pain, soreness, and some swelling where the pacemaker was inserted.   Make sure to keep your incision clean and dry. Follow instructions from your health care provider about how to take care of your incision.   Check your incision every day for signs of infection, such as more pain or swelling, pus or a bad smell, warmth, or leaking fluid and blood.   Avoid strenuous exercise and lifting your left arm higher than your shoulder for 2 weeks, or as long as told by your health care provider.  This information is not intended to replace advice given to you by  your health care provider. Make sure you discuss any questions you have with your health care provider.  Document Released: 12/22/2012 Document Revised: 01/24/2016 Document Reviewed: 01/24/2016  Elsevier Interactive Patient Education  2019 [employer]

## 2018-07-23 NOTE — H&P (Signed)
Patient Care Team: [name], MD as PCP - General (Internal Medicine)   HPI  Manuel Lewis is a 85 y.o. male Seen to followup for  pacemaker care for single-chamber device implanted in May 2011 for bradycardia in the setting of permanent atrial fibrillation. Thromboembolic risk factors are notable for age-77 and hypertension for a CHADS VASC score of 3      Date Cr K Hgb  3/18 0.82  11.8  1/19  1.04 3.9 12.9        Today, denies symptoms of palpitations, chest pain, shortness of breath, orthopnea, PND, lower extremity edema, claudication, dizziness, presyncope, syncope, bleeding, or neurologic sequela. The patient is tolerating medications without difficulties.    Past Medical History:  Diagnosis Date  . Chronic a-fib    since early 1990's with profound bradycardia and pauses; s/p  implantation  of Medtronic Adapta [id number], serial # [id number]  . Complication of anesthesia 1998   bradycardia  . Dysrhythmia   . Hypertension 1990  . Inguinal hernia   . Macular degeneration   . Nocturia   . Pacemaker   . Prostate cancer ([place]) 1998   with seed implantation    Past Surgical History:  Procedure Laterality Date  . APPENDECTOMY  1941  . CARDIOVERSION     IN THE 1990's  . CATARACT EXTRACTION     2000 and 2007/ h/o macular degeneration since 2009  . CHOLECYSTECTOMY N/A 05/18/2016   Procedure: LAPAROSCOPIC CHOLECYSTECTOMY WITH INTRAOPERATIVE CHOLANGIOGRAM;  Surgeon: Alphonsa Overall, MD;  Location: [facility];  Service: General;  Laterality: N/A;  . ERCP N/A 05/21/2016   Procedure: ENDOSCOPIC RETROGRADE CHOLANGIOPANCREATOGRAPHY (ERCP);  Surgeon: Ladene Artist, MD;  Location: [facility];  Service: Endoscopy;  Laterality: N/A;  . [place]  . HERNIA REPAIR  08/18/11   inguinal  . INGUINAL HERNIA REPAIR  08/18/2011   Procedure: HERNIA REPAIR INGUINAL ADULT;  Surgeon: [name], MD;  Location: [facility];  Service: General;  Laterality: Left;  Recurrent Left  inguinal hernia repair  . INGUINAL HERNIA REPAIR Right 04/02/2017   Procedure: OPEN RIGHT INGUINAL HERNIA REPAIR;  Surgeon: Alphonsa Overall, MD;  Location: [facility];  Service: General;  Laterality: Right;  . INSERT / REPLACE / REMOVE PACEMAKER  08/14/09   IMPLANTATION OF SINGLE CHAMBER PULSE  GENERATING SYSTEM;   MODEL ADSR01, SERIAL # [id number] H ADAPTA  . INSERTION OF MESH Right 04/02/2017   Procedure: INSERTION OF MESH;  Surgeon: Alphonsa Overall, MD;  Location: [facility];  Service: General;  Laterality: Right;  . THYROIDECTOMY, PARTIAL  1960   benign nodule  . TONSILLECTOMY    . US ECHOCARDIOGRAPHY  07/26/2009   EF 55-60%    Current Facility-Administered Medications  Medication Dose Route Frequency Provider Last Rate Last Dose  . 0.9 %  sodium chloride infusion   Intravenous Continuous Constance Haw, MD 50 mL/hr at 07/23/18 0800    . ceFAZolin (ANCEF) IVPB 2g/100 mL premix  2 g Intravenous On Call Kalyb Pemble Hassell Done, MD      . chlorhexidine (HIBICLENS) 4 % liquid 4 application  60 mL Topical Once Adarrius Graeff Hassell Done, MD      . chlorhexidine (HIBICLENS) 4 % liquid 4 application  60 mL Topical Once Masiel Gentzler Hassell Done, MD      . gentamicin (GARAMYCIN) 80 mg in sodium chloride 0.9 % 500 mL irrigation  80 mg Irrigation On Call Constance Haw, MD  No Known Allergies  Review of Systems negative except from HPI and PMH  Physical Exam BP (!) 165/67   Pulse 66   Temp (!) 97.4 F (36.3 C) (Oral)   Resp 18   Ht 5\' 9"  (1.753 m)   Wt 66.7 kg   SpO2 98%   BMI 21.71 kg/m  GEN: Well nourished, well developed, in no acute distress  HEENT: normal  Neck: no JVD, carotid bruits, or masses Cardiac: RRR; no murmurs, rubs, or gallops,no edema  Respiratory:  clear to auscultation bilaterally, normal work of breathing GI: soft, nontender, nondistended, + BS MS: no deformity or atrophy  Skin: warm and dry, device site well healed Neuro:  Strength  and sensation are intact Psych: euthymic mood, full affect    ECG atrial fibrillation with complete heart block and V pacing @ 67    Assessment and  Plan  Complete heart block stable post pacing  Atrial fibrillation-permanent   pacemaker-Medtronic The patient's device was interrogated.  The information was reviewed. No changes were made in the programming.     Hypertension  Manuel Lewis has presented today for surgery, with the diagnosis of complete heart block.  The various methods of treatment have been discussed with the patient and family. After consideration of risks, benefits and other options for treatment, the patient has consented to  Procedure(s): Pacemaker generator change as a surgical intervention .  Risks include but not limited to bleeding, tamponade, infection, pneumothorax, among others. The patient's history has been reviewed, patient examined, no change in status, stable for surgery.  I have reviewed the patient's chart and labs.  Questions were answered to the patient's satisfaction.    [name], MD 07/23/2018 8:35 AM

## 2018-07-23 NOTE — Progress Notes (Signed)
Reviewed discharge instructions with pt voices understanding, Pt son Dr [name] called to see if he had any questions. Reviewed d/c instructions

## 2018-07-26 ENCOUNTER — Telehealth: Payer: Self-pay

## 2018-07-26 MED FILL — Cefazolin Sodium-Dextrose IV Solution 2 GM/100ML-4%: INTRAVENOUS | Qty: 100 | Status: AC

## 2018-07-26 MED FILL — Gentamicin Sulfate Inj 40 MG/ML: INTRAMUSCULAR | Qty: 80 | Status: AC

## 2018-07-26 MED FILL — Lidocaine HCl Local Inj 1%: INTRAMUSCULAR | Qty: 60 | Status: AC

## 2018-07-26 NOTE — Telephone Encounter (Signed)
Left message for patient to remind of missed remote transmission.  

## 2018-07-27 NOTE — Progress Notes (Signed)
Opened in error

## 2018-08-04 ENCOUNTER — Other Ambulatory Visit: Payer: Self-pay

## 2018-08-04 ENCOUNTER — Ambulatory Visit (INDEPENDENT_AMBULATORY_CARE_PROVIDER_SITE_OTHER): Payer: Medicare Other | Admitting: *Deleted

## 2018-08-04 DIAGNOSIS — R001 Bradycardia, unspecified: Secondary | ICD-10-CM

## 2018-08-04 LAB — CUP PACEART INCLINIC DEVICE CHECK
Battery Remaining Longevity: 133 mo
Battery Voltage: 3.21 V
Brady Statistic RV Percent Paced: 99.84 %
Date Time Interrogation Session: 20200520115536
Implantable Lead Implant Date: 20110531
Implantable Lead Location: 753860
Implantable Lead Model: 4471
Implantable Lead Serial Number: 484940
Implantable Pulse Generator Implant Date: 20200508
Lead Channel Impedance Value: 247 Ohm
Lead Channel Impedance Value: 304 Ohm
Lead Channel Pacing Threshold Amplitude: 1 V
Lead Channel Pacing Threshold Pulse Width: 0.4 ms
Lead Channel Sensing Intrinsic Amplitude: 8 mV
Lead Channel Setting Pacing Amplitude: 2.5 V
Lead Channel Setting Pacing Pulse Width: 0.4 ms
Lead Channel Setting Sensing Sensitivity: 1.2 mV

## 2018-08-04 NOTE — Patient Instructions (Signed)
Hold Coumadin for 3 days. Remove pressure dressing on Saturday.

## 2018-08-12 ENCOUNTER — Ambulatory Visit (INDEPENDENT_AMBULATORY_CARE_PROVIDER_SITE_OTHER): Payer: Medicare Other | Admitting: *Deleted

## 2018-08-12 ENCOUNTER — Other Ambulatory Visit: Payer: Self-pay

## 2018-08-12 DIAGNOSIS — R001 Bradycardia, unspecified: Secondary | ICD-10-CM

## 2018-08-12 NOTE — Patient Instructions (Addendum)
Follow-up with Dr Laurann Montana to have INR checked . Call our office if hematoma at pacemaker site becomes larger or if there is any bleeding or drainage from site. Have INR checked with Dr Laurann Montana 08/13/18 at 9 am.

## 2018-08-12 NOTE — Progress Notes (Signed)
Wound check F/U. Hematoma at incision site decreased in size and softer. Dr Curt Bears informed of pt condition. Dr Curt Bears  asked that pt f/u with Dr Laurann Montana to have INR checked by 08/16/18. Pt educated to call office if he has drainage or bleeding from site.Pt has appointment on 08/13/18 with Dr Laurann Montana for INR check.

## 2018-11-01 ENCOUNTER — Telehealth: Payer: Self-pay

## 2018-11-01 NOTE — Telephone Encounter (Signed)

## 2018-11-02 ENCOUNTER — Telehealth: Payer: Self-pay | Admitting: Internal Medicine

## 2018-11-02 ENCOUNTER — Ambulatory Visit (INDEPENDENT_AMBULATORY_CARE_PROVIDER_SITE_OTHER): Payer: Medicare Other | Admitting: *Deleted

## 2018-11-02 DIAGNOSIS — R001 Bradycardia, unspecified: Secondary | ICD-10-CM

## 2018-11-02 LAB — CUP PACEART REMOTE DEVICE CHECK
Battery Remaining Longevity: 129 mo
Battery Voltage: 3.18 V
Brady Statistic RV Percent Paced: 99.66 %
Date Time Interrogation Session: 20200818123120
Implantable Lead Implant Date: 20110531
Implantable Lead Location: 753860
Implantable Lead Model: 4471
Implantable Lead Serial Number: 484940
Implantable Pulse Generator Implant Date: 20200508
Lead Channel Impedance Value: 266 Ohm
Lead Channel Impedance Value: 304 Ohm
Lead Channel Pacing Threshold Amplitude: 1 V
Lead Channel Pacing Threshold Pulse Width: 0.4 ms
Lead Channel Sensing Intrinsic Amplitude: 6.875 mV
Lead Channel Sensing Intrinsic Amplitude: 6.875 mV
Lead Channel Setting Pacing Amplitude: 2.5 V
Lead Channel Setting Pacing Pulse Width: 0.4 ms
Lead Channel Setting Sensing Sensitivity: 1.2 mV

## 2018-11-02 NOTE — Telephone Encounter (Signed)
New message     1. Has your device fired? NO  2. Is you device beeping? NO  3. Are you experiencing draining or swelling at device site? NO  4. Are you calling to see if we received your device transmission? YES  5. Have you passed out? NO    Please route to [place]

## 2018-11-02 NOTE — Telephone Encounter (Signed)
LMOVM informing pt that his remote transmission has been received.

## 2018-11-04 ENCOUNTER — Encounter: Payer: Self-pay | Admitting: Internal Medicine

## 2018-11-04 ENCOUNTER — Other Ambulatory Visit: Payer: Self-pay

## 2018-11-04 ENCOUNTER — Telehealth (INDEPENDENT_AMBULATORY_CARE_PROVIDER_SITE_OTHER): Payer: Medicare Other | Admitting: Internal Medicine

## 2018-11-04 VITALS — BP 142/74 | HR 81 | Wt 145.0 lb

## 2018-11-04 DIAGNOSIS — Z950 Presence of cardiac pacemaker: Secondary | ICD-10-CM | POA: Diagnosis not present

## 2018-11-04 DIAGNOSIS — I442 Atrioventricular block, complete: Secondary | ICD-10-CM | POA: Diagnosis not present

## 2018-11-04 DIAGNOSIS — I4821 Permanent atrial fibrillation: Secondary | ICD-10-CM | POA: Diagnosis not present

## 2018-11-04 DIAGNOSIS — Z7901 Long term (current) use of anticoagulants: Secondary | ICD-10-CM

## 2018-11-04 DIAGNOSIS — I10 Essential (primary) hypertension: Secondary | ICD-10-CM | POA: Diagnosis not present

## 2018-11-04 DIAGNOSIS — I4891 Unspecified atrial fibrillation: Secondary | ICD-10-CM

## 2018-11-04 NOTE — Progress Notes (Signed)
Electrophysiology TeleHealth Note   Due to national recommendations of social distancing due to COVID 19, an audio/video telehealth visit is felt to be most appropriate for this patient at this time.  See MyChart message from today for the patient's consent to telehealth for [facility]   Date:  11/04/2018   ID:  Manuel Lewis, DOB 04-10-1921, MRN [id number]  Location: patient's home  Provider location: [address], [place]  Evaluation Performed: Follow-up visit  PCP:  [name], MD  Cardiologist:     Electrophysiologist:  SK   Chief Complaint:  afib  History of Present Illness:    Manuel Lewis is a 85 y.o. male who presents via audio/video conferencing for a telehealth visit today.  Since last being seen in our clinic for complete heart block with a previously implanted pacemaker in the setting of atrial fibrillation-permanent*  the patient reports doing well.  Biggest issue has been trying to adjust to the loss of his wife of 74 years  Denies edema chest pain shortness of breath.  No bleeding.    The patient denies symptoms of fevers, chills, cough, or new SOB worrisome for COVID 19.   Past Medical History:  Diagnosis Date  . Chronic a-fib    since early 1990's with profound bradycardia and pauses; s/p  implantation  of Medtronic Adapta [id number], serial # [id number]  . Complication of anesthesia 1998   bradycardia  . Dysrhythmia   . Hypertension 1990  . Inguinal hernia   . Macular degeneration   . Nocturia   . Pacemaker   . Prostate cancer ([place]) 1998   with seed implantation    Past Surgical History:  Procedure Laterality Date  . APPENDECTOMY  1941  . CARDIOVERSION     IN THE 1990's  . CATARACT EXTRACTION     2000 and 2007/ h/o macular degeneration since 2009  . CHOLECYSTECTOMY N/A 05/18/2016   Procedure: LAPAROSCOPIC CHOLECYSTECTOMY WITH INTRAOPERATIVE CHOLANGIOGRAM;  Surgeon: Alphonsa Overall, MD;  Location: [facility];  Service: General;   Laterality: N/A;  . ERCP N/A 05/21/2016   Procedure: ENDOSCOPIC RETROGRADE CHOLANGIOPANCREATOGRAPHY (ERCP);  Surgeon: Ladene Artist, MD;  Location: [facility];  Service: Endoscopy;  Laterality: N/A;  . [place]  . HERNIA REPAIR  08/18/11   inguinal  . INGUINAL HERNIA REPAIR  08/18/2011   Procedure: HERNIA REPAIR INGUINAL ADULT;  Surgeon: [name], MD;  Location: [facility];  Service: General;  Laterality: Left;  Recurrent Left inguinal hernia repair  . INGUINAL HERNIA REPAIR Right 04/02/2017   Procedure: OPEN RIGHT INGUINAL HERNIA REPAIR;  Surgeon: Alphonsa Overall, MD;  Location: [facility];  Service: General;  Laterality: Right;  . INSERT / REPLACE / REMOVE PACEMAKER  08/14/09   IMPLANTATION OF SINGLE CHAMBER PULSE  GENERATING SYSTEM;   MODEL ADSR01, SERIAL # [id number] H ADAPTA  . INSERTION OF MESH Right 04/02/2017   Procedure: INSERTION OF MESH;  Surgeon: Alphonsa Overall, MD;  Location: [facility];  Service: General;  Laterality: Right;  . PPM GENERATOR CHANGEOUT N/A 07/23/2018   Procedure: PPM GENERATOR CHANGEOUT;  Surgeon: Constance Haw, MD;  Location: [facility];  Service: Cardiovascular;  Laterality: N/A;  . THYROIDECTOMY, PARTIAL  1960   benign nodule  . TONSILLECTOMY    . US ECHOCARDIOGRAPHY  07/26/2009   EF 55-60%    Current Outpatient Medications  Medication Sig Dispense Refill  . amLODipine (  NORVASC) 5 MG tablet Take 5 mg by mouth daily.  3  . hydrochlorothiazide (,MICROZIDE/HYDRODIURIL,) 12.5 MG capsule Take 12.5 mg by mouth daily with breakfast.     . magnesium hydroxide (MILK OF MAGNESIA) 400 MG/5ML suspension Take 15 mLs by mouth daily as needed for mild constipation.    . polyethylene glycol (MIRALAX / GLYCOLAX) 17 g packet Take 17 g by mouth daily as needed for mild constipation.    Marland Kitchen telmisartan (MICARDIS) 80 MG tablet Take 80 mg by mouth daily.    Marland Kitchen warfarin (COUMADIN) 2 MG tablet Take 1-2 mg by mouth every  evening. Takes 1 mg in the evening everyday except on Monday's take 2 mg in the evening     No current facility-administered medications for this visit.     Allergies:   Patient has no known allergies.   Social History:  The patient  reports that he has never smoked. He has never used smokeless tobacco. He reports current alcohol use of about 7.0 standard drinks of alcohol per week. He reports that he does not use drugs.   Family History:  The patient's   family history includes Kidney failure in his father; Stroke in his mother.   ROS:  Please see the history of present illness.   All other systems are personally reviewed and negative.    Exam:    Vital Signs:  BP (!) 142/74 (BP Location: Right Arm, Patient Position: Sitting, Cuff Size: Normal)   Pulse 81   Wt 145 lb (65.8 kg)   BMI 21.41 kg/m     Well appearing, alert and conversant, regular work of breathing,  good skin color Eyes- anicteric, neuro- grossly intact, skin- no apparent rash or lesions or cyanosis, mouth- oral mucosa is pink   Labs/Other Tests and Data Reviewed:    Recent Labs: 07/20/2018: BUN 23; Creatinine, Ser 0.90; Hemoglobin 12.5; Platelets 275; Potassium 4.4; Sodium 139   Wt Readings from Last 3 Encounters:  11/04/18 145 lb (65.8 kg)  07/23/18 147 lb (66.7 kg)  04/26/18 145 lb (65.8 kg)     Other studies personally reviewed:    Device function 8/20 was reviewed.  They are not Paceart.  Normal device function.  No arrhythmias.  ASSESSMENT & PLAN:    Complete heart block    Atrial fibrillation-permanent   pacemaker-Medtronic   Hypertension  On Anticoagulation;  No bleeding issues discussion with his daughter regarding transition to a Shelda Altes specifically apixaban.  Think that that would be reasonable.  Previously declined.  Blood pressure better on telmisartan being managed by Dr. Laurann Montana  COVID 19 screen The patient denies symptoms of COVID 19 at this time.  The importance of social  distancing was discussed today.  Follow-up:  36m    Current medicines are reviewed at length with the patient today.   The patient  Has concerns regarding his medicines indeed his daughter and I talked about switching him from warfarin to a NOAC.  They will consider and let us know..   The following changes were made today:  none  Labs/ tests ordered today include:   No orders of the defined types were placed in this encounter.   Future tests ( post COVID )     Patient Risk:  after full review of this patients clinical status, I feel that they are at moderate* * risk at this time.  Today, I have spent 41minutes with the patient with telehealth technology discussing the above.  Signed, Virl Axe, MD  11/04/2018 1:45 PM     [address] [place] [address] [phone] (office) [phone] (fax)

## 2018-11-05 NOTE — Patient Instructions (Signed)
Your physician wants you to follow-up in: 12 months with Dr. Caryl Comes.  You will receive a reminder letter in the mail two months in advance. If you don't receive a letter, please call our office to schedule the follow-up appointment.   [phone].

## 2018-11-10 ENCOUNTER — Encounter: Payer: Self-pay | Admitting: Cardiology

## 2018-11-10 NOTE — Progress Notes (Signed)
Remote pacemaker transmission.   

## 2019-02-02 ENCOUNTER — Encounter: Payer: Medicare Other | Admitting: *Deleted

## 2019-03-14 ENCOUNTER — Telehealth: Payer: Self-pay

## 2019-03-14 ENCOUNTER — Ambulatory Visit (INDEPENDENT_AMBULATORY_CARE_PROVIDER_SITE_OTHER): Payer: Medicare Other | Admitting: *Deleted

## 2019-03-14 DIAGNOSIS — I4821 Permanent atrial fibrillation: Secondary | ICD-10-CM

## 2019-03-14 LAB — CUP PACEART REMOTE DEVICE CHECK
Battery Remaining Longevity: 125 mo
Battery Voltage: 3.1 V
Brady Statistic RV Percent Paced: 99.86 %
Date Time Interrogation Session: 20201228095104
Implantable Lead Implant Date: 20110531
Implantable Lead Location: 753860
Implantable Lead Model: 4471
Implantable Lead Serial Number: 484940
Implantable Pulse Generator Implant Date: 20200508
Lead Channel Impedance Value: 266 Ohm
Lead Channel Impedance Value: 304 Ohm
Lead Channel Pacing Threshold Amplitude: 0.75 V
Lead Channel Pacing Threshold Pulse Width: 0.4 ms
Lead Channel Sensing Intrinsic Amplitude: 7 mV
Lead Channel Sensing Intrinsic Amplitude: 7 mV
Lead Channel Setting Pacing Amplitude: 2.5 V
Lead Channel Setting Pacing Pulse Width: 0.4 ms
Lead Channel Setting Sensing Sensitivity: 1.2 mV

## 2019-03-14 NOTE — Telephone Encounter (Signed)
Transmission received. I put him on a 91 day schedule.

## 2019-03-14 NOTE — Telephone Encounter (Signed)
Pt going to try to transmit and wants me to call him back to let him know if we got the transmission.

## 2019-04-03 ENCOUNTER — Ambulatory Visit: Payer: Medicare Other | Attending: Internal Medicine

## 2019-04-03 DIAGNOSIS — Z23 Encounter for immunization: Secondary | ICD-10-CM | POA: Insufficient documentation

## 2019-04-03 NOTE — Progress Notes (Signed)
   Covid-19 Vaccination Clinic  Name:  FREY KUE    MRN: [id number] DOB: 05/21/1921  04/03/2019  Mr. Steiner was observed post Covid-19 immunization for 15 mins  without incidence. He was provided with Vaccine Information Sheet and instruction to access the V-Safe system.   Mr. Hasenstab was instructed to call 911 with any severe reactions post vaccine: Marland Kitchen Difficulty breathing  . Swelling of your face and throat  . A fast heartbeat  . A bad rash all over your body  . Dizziness and weakness

## 2019-04-23 ENCOUNTER — Ambulatory Visit: Payer: Medicare Other | Attending: Internal Medicine

## 2019-04-23 DIAGNOSIS — Z23 Encounter for immunization: Secondary | ICD-10-CM | POA: Insufficient documentation

## 2019-04-23 NOTE — Progress Notes (Signed)
   Covid-19 Vaccination Clinic  Name:  Manuel Lewis    MRN: [id number] DOB: 05/24/1921  04/23/2019  [name] was observed post Covid-19 immunization for 15 minutes without incidence. He was provided with Vaccine Information Sheet and instruction to access the V-Safe system.   [name] was instructed to call 911 with any severe reactions post vaccine: Marland Kitchen Difficulty breathing  . Swelling of your face and throat  . A fast heartbeat  . A bad rash all over your body  . Dizziness and weakness    Immunizations Administered    Name Date Dose VIS Date Route   Pfizer COVID-19 Vaccine 04/23/2019 10:15 AM 0.3 mL 02/25/2019 Intramuscular   Manufacturer: [place]   Lot: [id number]   [place]: [id number]

## 2019-06-07 ENCOUNTER — Other Ambulatory Visit: Payer: Self-pay | Admitting: Plastic Surgery

## 2019-06-13 ENCOUNTER — Ambulatory Visit (INDEPENDENT_AMBULATORY_CARE_PROVIDER_SITE_OTHER): Payer: Medicare Other | Admitting: *Deleted

## 2019-06-13 DIAGNOSIS — I4821 Permanent atrial fibrillation: Secondary | ICD-10-CM

## 2019-06-13 LAB — CUP PACEART REMOTE DEVICE CHECK
Battery Remaining Longevity: 124 mo
Battery Voltage: 3.05 V
Brady Statistic RV Percent Paced: 99.89 %
Date Time Interrogation Session: 20210329092623
Implantable Lead Implant Date: 20110531
Implantable Lead Location: 753860
Implantable Lead Model: 4471
Implantable Lead Serial Number: 484940
Implantable Pulse Generator Implant Date: 20200508
Lead Channel Impedance Value: 285 Ohm
Lead Channel Impedance Value: 323 Ohm
Lead Channel Pacing Threshold Amplitude: 0.75 V
Lead Channel Pacing Threshold Pulse Width: 0.4 ms
Lead Channel Sensing Intrinsic Amplitude: 6.375 mV
Lead Channel Sensing Intrinsic Amplitude: 6.375 mV
Lead Channel Setting Pacing Amplitude: 2.5 V
Lead Channel Setting Pacing Pulse Width: 0.4 ms
Lead Channel Setting Sensing Sensitivity: 1.2 mV

## 2019-06-13 NOTE — Progress Notes (Signed)
PPM Remote  

## 2019-06-23 ENCOUNTER — Encounter (INDEPENDENT_AMBULATORY_CARE_PROVIDER_SITE_OTHER): Payer: Medicare Other | Admitting: Ophthalmology

## 2019-06-28 ENCOUNTER — Encounter (INDEPENDENT_AMBULATORY_CARE_PROVIDER_SITE_OTHER): Payer: Medicare Other | Admitting: Ophthalmology

## 2019-06-30 ENCOUNTER — Encounter (HOSPITAL_COMMUNITY): Payer: Self-pay | Admitting: Emergency Medicine

## 2019-06-30 ENCOUNTER — Emergency Department (HOSPITAL_COMMUNITY): Payer: Medicare Other

## 2019-06-30 ENCOUNTER — Other Ambulatory Visit: Payer: Self-pay

## 2019-06-30 ENCOUNTER — Inpatient Hospital Stay (HOSPITAL_COMMUNITY)
Admission: EM | Admit: 2019-06-30 | Discharge: 2019-07-06 | DRG: 522 | Disposition: A | Discharge status: Home Health | Payer: Medicare Other | Attending: Internal Medicine | Admitting: Internal Medicine

## 2019-06-30 DIAGNOSIS — Z20822 Contact with and (suspected) exposure to covid-19: Secondary | ICD-10-CM | POA: Diagnosis present

## 2019-06-30 DIAGNOSIS — W03XXXA Other fall on same level due to collision with another person, initial encounter: Secondary | ICD-10-CM | POA: Diagnosis present

## 2019-06-30 DIAGNOSIS — S72002A Fracture of unspecified part of neck of left femur, initial encounter for closed fracture: Principal | ICD-10-CM | POA: Diagnosis present

## 2019-06-30 DIAGNOSIS — D649 Anemia, unspecified: Secondary | ICD-10-CM | POA: Diagnosis present

## 2019-06-30 DIAGNOSIS — S79912A Unspecified injury of left hip, initial encounter: Secondary | ICD-10-CM | POA: Diagnosis present

## 2019-06-30 DIAGNOSIS — S72009A Fracture of unspecified part of neck of unspecified femur, initial encounter for closed fracture: Secondary | ICD-10-CM | POA: Diagnosis present

## 2019-06-30 DIAGNOSIS — S72012A Unspecified intracapsular fracture of left femur, initial encounter for closed fracture: Principal | ICD-10-CM | POA: Diagnosis present

## 2019-06-30 DIAGNOSIS — I4821 Permanent atrial fibrillation: Secondary | ICD-10-CM | POA: Diagnosis not present

## 2019-06-30 DIAGNOSIS — Z419 Encounter for procedure for purposes other than remedying health state, unspecified: Secondary | ICD-10-CM

## 2019-06-30 DIAGNOSIS — Z950 Presence of cardiac pacemaker: Secondary | ICD-10-CM | POA: Diagnosis present

## 2019-06-30 DIAGNOSIS — I482 Chronic atrial fibrillation, unspecified: Secondary | ICD-10-CM | POA: Diagnosis present

## 2019-06-30 DIAGNOSIS — Y9289 Other specified places as the place of occurrence of the external cause: Secondary | ICD-10-CM

## 2019-06-30 DIAGNOSIS — S72042A Displaced fracture of base of neck of left femur, initial encounter for closed fracture: Secondary | ICD-10-CM | POA: Diagnosis not present

## 2019-06-30 DIAGNOSIS — M1712 Unilateral primary osteoarthritis, left knee: Secondary | ICD-10-CM | POA: Diagnosis present

## 2019-06-30 DIAGNOSIS — H1132 Conjunctival hemorrhage, left eye: Secondary | ICD-10-CM | POA: Diagnosis present

## 2019-06-30 DIAGNOSIS — I442 Atrioventricular block, complete: Secondary | ICD-10-CM | POA: Diagnosis present

## 2019-06-30 DIAGNOSIS — Z96642 Presence of left artificial hip joint: Secondary | ICD-10-CM

## 2019-06-30 DIAGNOSIS — Z7901 Long term (current) use of anticoagulants: Secondary | ICD-10-CM

## 2019-06-30 DIAGNOSIS — E876 Hypokalemia: Secondary | ICD-10-CM | POA: Diagnosis present

## 2019-06-30 DIAGNOSIS — I4891 Unspecified atrial fibrillation: Secondary | ICD-10-CM | POA: Diagnosis present

## 2019-06-30 DIAGNOSIS — W19XXXA Unspecified fall, initial encounter: Secondary | ICD-10-CM

## 2019-06-30 DIAGNOSIS — I10 Essential (primary) hypertension: Secondary | ICD-10-CM | POA: Diagnosis present

## 2019-06-30 HISTORY — DX: Unspecified hearing loss, unspecified ear: H91.90

## 2019-06-30 HISTORY — DX: Unspecified atrial fibrillation: I48.91

## 2019-06-30 LAB — COMPREHENSIVE METABOLIC PANEL
ALT: 22 U/L (ref 0–44)
AST: 29 U/L (ref 15–41)
Albumin: 4.1 g/dL (ref 3.5–5.0)
Alkaline Phosphatase: 44 U/L (ref 38–126)
Anion gap: 11 (ref 5–15)
BUN: 25 mg/dL — ABNORMAL HIGH (ref 8–23)
CO2: 23 mmol/L (ref 22–32)
Calcium: 9.4 mg/dL (ref 8.9–10.3)
Chloride: 104 mmol/L (ref 98–111)
Creatinine, Ser: 0.92 mg/dL (ref 0.61–1.24)
GFR calc Af Amer: >60 mL/min (ref >60)
GFR calc non Af Amer: >60 mL/min (ref >60)
Glucose, Bld: 106 mg/dL — ABNORMAL HIGH (ref 70–99)
Potassium: 3.4 mmol/L — ABNORMAL LOW (ref 3.5–5.1)
Sodium: 138 mmol/L (ref 135–145)
Total Bilirubin: 1.5 mg/dL — ABNORMAL HIGH (ref 0.3–1.2)
Total Protein: 6.5 g/dL (ref 6.5–8.1)

## 2019-06-30 LAB — URINALYSIS, ROUTINE W REFLEX MICROSCOPIC
Bacteria, UA: NONE SEEN
Bilirubin Urine: NEGATIVE
Glucose, UA: NEGATIVE mg/dL
Hgb urine dipstick: NEGATIVE
Ketones, ur: NEGATIVE mg/dL
Leukocytes,Ua: NEGATIVE
Nitrite: NEGATIVE
Protein, ur: 100 mg/dL — AB
Specific Gravity, Urine: 1.012 (ref 1.005–1.030)
pH: 7 (ref 5.0–8.0)

## 2019-06-30 LAB — CBC
HCT: 36.6 % — ABNORMAL LOW (ref 39.0–52.0)
Hemoglobin: 12.4 g/dL — ABNORMAL LOW (ref 13.0–17.0)
MCH: 33.1 pg (ref 26.0–34.0)
MCHC: 33.9 g/dL (ref 30.0–36.0)
MCV: 97.6 fL (ref 80.0–100.0)
Platelets: 302 10*3/uL (ref 150–400)
RBC: 3.75 MIL/uL — ABNORMAL LOW (ref 4.22–5.81)
RDW: 13.2 % (ref 11.5–15.5)
WBC: 4.9 10*3/uL (ref 4.0–10.5)
nRBC: 0 % (ref 0.0–0.2)

## 2019-06-30 LAB — ETHANOL: Alcohol, Ethyl (B): <10 mg/dL (ref <10)

## 2019-06-30 LAB — TYPE AND SCREEN
ABO/RH(D): O POS
Antibody Screen: NEGATIVE

## 2019-06-30 LAB — SARS CORONAVIRUS 2 (TAT 6-24 HRS): SARS Coronavirus 2: NEGATIVE

## 2019-06-30 LAB — SURGICAL PCR SCREEN
MRSA, PCR: NEGATIVE
Staphylococcus aureus: NEGATIVE

## 2019-06-30 LAB — LACTIC ACID, PLASMA: Lactic Acid, Venous: 1.6 mmol/L (ref 0.5–1.9)

## 2019-06-30 LAB — PROTIME-INR
INR: 3 — ABNORMAL HIGH (ref 0.8–1.2)
Prothrombin Time: 31 seconds — ABNORMAL HIGH (ref 11.4–15.2)

## 2019-06-30 LAB — ABO/RH: ABO/RH(D): O POS

## 2019-06-30 MED ORDER — VITAMIN K1 10 MG/ML IJ SOLN
10.0000 mg | Freq: Once | INTRAVENOUS | Status: AC
  Administered 2019-06-30: 10 mg via INTRAVENOUS
  Filled 2019-06-30: qty 1

## 2019-06-30 MED ORDER — FENTANYL CITRATE (PF) 100 MCG/2ML IJ SOLN
50.0000 ug | INTRAMUSCULAR | Status: AC | PRN
  Administered 2019-06-30 (×2): 50 ug via INTRAVENOUS
  Filled 2019-06-30 (×2): qty 2

## 2019-06-30 NOTE — Progress Notes (Signed)
Orthopedic Tech Progress Note Patient Details:  Manuel Lewis 1921/11/19 [id number] level 2 trauma Patient ID: [name], male   DOB: 08-12-1921, 86 y.o.   MRN: [id number]   Janit Pagan 06/30/2019, 4:08 PM

## 2019-06-30 NOTE — ED Notes (Signed)
Pt transported to CT ?

## 2019-06-30 NOTE — Consult Note (Signed)
Reason for Consult:  Left hip fracture Referring Physician:  EDP  Manuel Lewis is an 86 y.o. male.  HPI:   Manuel Lewis is a 86 year old gentleman who is actually the son of Manuel Lewis one of my colleagues.  He had an accidental mechanical fall today landing directly on his left hip.  He had obvious left hip deformity with the inability to ambulate.  He does report left hip pain.  X-rays in the [facility] emergency room were consistent with a displaced left hip femoral neck fracture.  He had not had any previous left hip pain prior to this.  He does ambulate regularly and according to his son does drive.  He does have a history of atrial fibrillation and is on Coumadin.  His INR is 3.  Past Medical History:  Diagnosis Date  . Atrial fibrillation ([place])   . Hearing loss   . Hypertension     Past Surgical History:  Procedure Laterality Date  . pace maker      History reviewed. No pertinent family history.  Social History:  reports that he has never smoked. He has never used smokeless tobacco. He reports previous alcohol use. He reports previous drug use.  Allergies: No Known Allergies  Medications: I have reviewed the patient's current medications.  Results for orders placed or performed during the hospital encounter of 06/30/19 (from the past 48 hour(s))  Urinalysis, Routine w reflex microscopic     Status: Abnormal   Collection Time: 06/30/19  4:13 PM  Result Value Ref Range   Color, Urine YELLOW YELLOW   APPearance CLEAR CLEAR   Specific Gravity, Urine 1.012 1.005 - 1.030   pH 7.0 5.0 - 8.0   Glucose, UA NEGATIVE NEGATIVE mg/dL   Hgb urine dipstick NEGATIVE NEGATIVE   Bilirubin Urine NEGATIVE NEGATIVE   Ketones, ur NEGATIVE NEGATIVE mg/dL   Protein, ur 100 (A) NEGATIVE mg/dL   Nitrite NEGATIVE NEGATIVE   Leukocytes,Ua NEGATIVE NEGATIVE   RBC / HPF 0-5 0 - 5 RBC/hpf   WBC, UA 0-5 0 - 5 WBC/hpf   Bacteria, UA NONE SEEN NONE SEEN   Mucus PRESENT     Comment: Performed  at [facility], [address], [place], [place] [address]  Type and screen [place]     Status: None   Collection Time: 06/30/19  4:20 PM  Result Value Ref Range   ABO/RH(D) O POS    Antibody Screen NEG    Sample Expiration      07/03/2019,2359 Performed at [facility], [place] [address], [place], [place] [address]   ABO/Rh     Status: None (Preliminary result)   Collection Time: 06/30/19  4:20 PM  Result Value Ref Range   ABO/RH(D)      O POS Performed at [facility] [address], [place], [place] [address]   CBC     Status: Abnormal   Collection Time: 06/30/19  4:30 PM  Result Value Ref Range   WBC 4.9 4.0 - 10.5 K/uL   RBC 3.75 (L) 4.22 - 5.81 MIL/uL   Hemoglobin 12.4 (L) 13.0 - 17.0 g/dL   HCT 36.6 (L) 39.0 - 52.0 %   MCV 97.6 80.0 - 100.0 fL   MCH 33.1 26.0 - 34.0 pg   MCHC 33.9 30.0 - 36.0 g/dL   RDW 13.2 11.5 - 15.5 %   Platelets 302 150 - 400 K/uL   nRBC 0.0 0.0 -  0.2 %    Comment: Performed at [facility], [place] [address], [place], [place] [address]  Lactic acid, plasma     Status: None   Collection Time: 06/30/19  4:30 PM  Result Value Ref Range   Lactic Acid, Venous 1.6 0.5 - 1.9 mmol/L    Comment: Performed at [facility] [address] [place], [place] [address]  Ethanol     Status: None   Collection Time: 06/30/19  4:55 PM  Result Value Ref Range   Alcohol, Ethyl (B) <10 <10 mg/dL    Comment: (NOTE) Lowest detectable limit for serum alcohol is 10 mg/dL. For medical purposes only. Performed at [facility], [place] [address], [place], [place] [address]   Protime-INR     Status: Abnormal   Collection Time: 06/30/19  6:19 PM  Result Value Ref Range   Prothrombin Time 31.0 (H) 11.4 - 15.2 seconds   INR 3.0 (H) 0.8 - 1.2    Comment: (NOTE) INR goal varies based on device and disease states. Performed at [facility], [place] [address] [place], [place] [address]   Comprehensive metabolic  panel     Status: Abnormal   Collection Time: 06/30/19  6:19 PM  Result Value Ref Range   Sodium 138 135 - 145 mmol/L   Potassium 3.4 (L) 3.5 - 5.1 mmol/L   Chloride 104 98 - 111 mmol/L   CO2 23 22 - 32 mmol/L   Glucose, Bld 106 (H) 70 - 99 mg/dL    Comment: Glucose reference range applies only to samples taken after fasting for at least 8 hours.   BUN 25 (H) 8 - 23 mg/dL   Creatinine, Ser 0.92 0.61 - 1.24 mg/dL   Calcium 9.4 8.9 - 10.3 mg/dL   Total Protein 6.5 6.5 - 8.1 g/dL   Albumin 4.1 3.5 - 5.0 g/dL   AST 29 15 - 41 U/L   ALT 22 0 - 44 U/L   Alkaline Phosphatase 44 38 - 126 U/L   Total Bilirubin 1.5 (H) 0.3 - 1.2 mg/dL   GFR calc non Af Amer >60 >60 mL/min   GFR calc Af Amer >60 >60 mL/min   Anion gap 11 5 - 15    Comment: Performed at [facility] [address] [place],  [address]    CT HEAD WO CONTRAST  Result Date: 06/30/2019 CLINICAL DATA:  Recent fall with headaches and neck pain, initial encounter EXAM: CT HEAD WITHOUT CONTRAST CT CERVICAL SPINE WITHOUT CONTRAST TECHNIQUE: Multidetector CT imaging of the head and cervical spine was performed following the standard protocol without intravenous contrast. Multiplanar CT image reconstructions of the cervical spine were also generated. COMPARISON:  None. FINDINGS: CT HEAD FINDINGS Brain: No evidence of acute infarction, hemorrhage, hydrocephalus, extra-axial collection or mass lesion/mass effect. Chronic atrophic and ischemic changes are noted. Vascular: No hyperdense vessel or unexpected calcification. Skull: Normal. Negative for fracture or focal lesion. Sinuses/Orbits: No acute finding. Other: Nasal bone fractures are seen but of uncertain chronicity. Correlation with the physical exam is recommended. CT CERVICAL SPINE FINDINGS Alignment: Within normal limits. Skull base and vertebrae: 7 cervical segments are well visualized. Vertebral body height is well maintained. Multilevel disc space narrowing with osteophytic  changes are seen. Multilevel facet hypertrophic changes are noted. No acute fracture or acute facet abnormality is noted. Soft tissues and spinal canal: Surrounding soft tissue structures show no acute abnormality. Vascular calcifications are seen. Scattered small  hypodensities are noted within the thyroid measuring less than 1 cm. Upper chest: Visualized lung apices demonstrate emphysematous change. Other: None IMPRESSION: CT of the head: Chronic atrophic and ischemic changes without acute intracranial abnormality Bilateral nasal bone fractures of uncertain chronicity. Correlation with the physical exam is recommended. CT of the cervical spine: Multilevel degenerative change without acute bony abnormality. Scattered small hypodensities within the thyroid gland. Given the patient's age, no follow-up recommended unless clinically warranted (ref: J Am Coll Radiol. 2015 Feb;12(2): 143-50). Electronically Signed   By: Inez Catalina M.D.   On: 06/30/2019 18:20   CT CERVICAL SPINE WO CONTRAST  Result Date: 06/30/2019 CLINICAL DATA:  Recent fall with headaches and neck pain, initial encounter EXAM: CT HEAD WITHOUT CONTRAST CT CERVICAL SPINE WITHOUT CONTRAST TECHNIQUE: Multidetector CT imaging of the head and cervical spine was performed following the standard protocol without intravenous contrast. Multiplanar CT image reconstructions of the cervical spine were also generated. COMPARISON:  None. FINDINGS: CT HEAD FINDINGS Brain: No evidence of acute infarction, hemorrhage, hydrocephalus, extra-axial collection or mass lesion/mass effect. Chronic atrophic and ischemic changes are noted. Vascular: No hyperdense vessel or unexpected calcification. Skull: Normal. Negative for fracture or focal lesion. Sinuses/Orbits: No acute finding. Other: Nasal bone fractures are seen but of uncertain chronicity. Correlation with the physical exam is recommended. CT CERVICAL SPINE FINDINGS Alignment: Within normal limits. Skull base and  vertebrae: 7 cervical segments are well visualized. Vertebral body height is well maintained. Multilevel disc space narrowing with osteophytic changes are seen. Multilevel facet hypertrophic changes are noted. No acute fracture or acute facet abnormality is noted. Soft tissues and spinal canal: Surrounding soft tissue structures show no acute abnormality. Vascular calcifications are seen. Scattered small hypodensities are noted within the thyroid measuring less than 1 cm. Upper chest: Visualized lung apices demonstrate emphysematous change. Other: None IMPRESSION: CT of the head: Chronic atrophic and ischemic changes without acute intracranial abnormality Bilateral nasal bone fractures of uncertain chronicity. Correlation with the physical exam is recommended. CT of the cervical spine: Multilevel degenerative change without acute bony abnormality. Scattered small hypodensities within the thyroid gland. Given the patient's age, no follow-up recommended unless clinically warranted (ref: J Am Coll Radiol. 2015 Feb;12(2): 143-50). Electronically Signed   By: Inez Catalina M.D.   On: 06/30/2019 18:20   DG Pelvis Portable  Result Date: 06/30/2019 CLINICAL DATA:  86 year old who fell earlier today. Patient is currently anticoagulated. EXAM: PORTABLE PELVIS 1-2 VIEWS COMPARISON:  Bone window images from CT abdomen and pelvis 05/19/2016. FINDINGS: Foreshortening of the LEFT femoral neck, either due to fracture or patient position. No fractures elsewhere. Hip joints anatomically aligned with well-preserved joint spaces for patient age. Sacroiliac joints and symphysis pubis anatomically aligned without diastasis. Degenerative changes involving the sacroiliac joints and the visualized lower lumbar spine, unchanged since the prior CT. Brachytherapy seeds in the prostate gland. IMPRESSION: Foreshortening of the LEFT femoral neck, either due to fracture or patient position. Dedicated LEFT hip x-rays are recommended.  Electronically Signed   By: Evangeline Dakin M.D.   On: 06/30/2019 16:22   DG Chest Port 1 View  Result Date: 06/30/2019 CLINICAL DATA:  86 year old who fell earlier today. Patient is currently anticoagulated. EXAM: PORTABLE CHEST 1 VIEW COMPARISON:  05/16/2016 and earlier. FINDINGS: Cardiac silhouette moderately to markedly enlarged for AP portable technique, increased in size since 2018. Thoracic aorta mildly atherosclerotic, unchanged. RIGHT subclavian single lead transvenous pacemaker, unchanged. Lungs clear. Bronchovascular markings normal. Pulmonary vascularity normal. No visible pleural effusions.  No pneumothorax. IMPRESSION: 1. Moderate to marked cardiomegaly, increased in size since 2018. 2. No acute cardiopulmonary disease. Electronically Signed   By: Evangeline Dakin M.D.   On: 06/30/2019 16:19   DG Hip Unilat W or Wo Pelvis 2-3 Views Left  Result Date: 06/30/2019 CLINICAL DATA:  86 year old who fell and has LEFT hip pain. EXAM: DG HIP (WITH OR WITHOUT PELVIS) 2-3V LEFT COMPARISON:  No prior LEFT hip x-rays. Bone window images from CT abdomen and pelvis 05/19/2016. FINDINGS: Acute subcapital or basicervical LEFT femoral neck fracture. Hip joint anatomically aligned with well-preserved joint space. Bone mineral density well preserved for patient age. IMPRESSION: Acute subcapital or basicervical LEFT femoral neck fracture. Electronically Signed   By: Evangeline Dakin M.D.   On: 06/30/2019 16:49    Review of Systems  HENT: Positive for hearing loss.   All other systems reviewed and are negative.  Blood pressure 134/72, pulse 60, temperature 98.5 F (36.9 C), temperature source Oral, resp. rate 15, height 5\' 9"  (1.753 m), weight 65.8 kg, SpO2 95 %. Physical Exam  Constitutional: He is oriented to person, place, and time. He appears well-developed and well-nourished.  HENT:  Head: Normocephalic and atraumatic.  Eyes: Pupils are equal, round, and reactive to light. EOM are normal.   Cardiovascular: Normal rate.  Respiratory: Effort normal.  GI: Soft.  Musculoskeletal:     Cervical back: Normal range of motion and neck supple.     Left hip: Deformity, tenderness and bony tenderness present. Decreased range of motion. Decreased strength.  Neurological: He is alert and oriented to person, place, and time.  Skin: Skin is warm and dry.  Psychiatric: He has a normal mood and affect.    Assessment/Plan: Displaced left hip femoral neck fracture  I have talked with Dr. [name] about his dad's situation.  Given the fact that he does ambulate and is mobile we are recommending surgical intervention.  This would be for a direct anterior left hip hemiarthroplasty versus a total hip replacement.  I described in detail what this involves as well as the risk and benefits of surgery.  My plan will be to hopefully proceed to the operating room tomorrow afternoon.  Obviously his INR will need to be below 2.  He has been given IV vitamin K.  A repeat INR will be drawn in the morning.  He can eat now and we will have him n.p.o. after midnight.  Mcarthur Rossetti 06/30/2019, 8:10 PM
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
## 2019-06-30 NOTE — ED Provider Notes (Signed)
[facility] DEPARTMENT Provider Note   CSN: [id number] Arrival date & time: 06/30/19  1552     History No chief complaint on file.   Manuel Lewis is a 86 y.o. male.  He was walking in a parking lot following his son when he accidentally bumped into him and fell to the ground.  He does not think he struck his head and did not lose consciousness.  He is complaining of 4 out of 10 left hip pain and inability to walk.  He is on anticoagulation.  He denies any head neck chest or abdominal pain.  No numbness or weakness distal.  No other extremity pain other than his left hip and thigh pain.  Level 2 trauma due to fall on anticoagulation  The history is provided by the patient and the EMS personnel.  Fall This is a new problem. The current episode started less than 1 hour ago. The problem has not changed since onset.Pertinent negatives include no chest pain, no abdominal pain, no headaches and no shortness of breath. The symptoms are aggravated by bending. Nothing relieves the symptoms. He has tried nothing for the symptoms. The treatment provided no relief.       Past Medical History:  Diagnosis Date  . Atrial fibrillation ([place])   . Hearing loss   . Hypertension     Patient Active Problem List   Diagnosis Date Noted  . Atrial fibrillation ([place]) 07/01/2019  . Pacemaker 07/01/2019  . Essential hypertension 07/01/2019  . Normocytic normochromic anemia 07/01/2019  . Hip fracture ([place]) 07/01/2019  . Closed left hip fracture, initial encounter ([place]) 06/30/2019    Past Surgical History:  Procedure Laterality Date  . pace maker         No family history on file.  Social History   Tobacco Use  . Smoking status: Never Smoker  . Smokeless tobacco: Never Used  Substance Use Topics  . Alcohol use: Not Currently  . Drug use: Not Currently    Home Medications Prior to Admission medications   Not on File    Allergies    Patient has no known  allergies.  Review of Systems   Review of Systems  Constitutional: Negative for fever.  HENT: Negative for sore throat.   Eyes: Positive for redness (left eye from prior incident).  Respiratory: Negative for shortness of breath.   Cardiovascular: Negative for chest pain.  Gastrointestinal: Negative for abdominal pain.  Genitourinary: Negative for dysuria.  Musculoskeletal: Positive for gait problem. Negative for neck pain.  Skin: Negative for rash.  Neurological: Negative for syncope, numbness and headaches.    Physical Exam Updated Vital Signs BP (!) 158/80   Temp 98.5 F (36.9 C) (Oral)   Resp 14   Ht 5\' 9"  (1.753 m)   Wt 65.8 kg   SpO2 99%   BMI 21.41 kg/m   Physical Exam Vitals and nursing note reviewed.  Constitutional:      Appearance: He is well-developed.  HENT:     Head: Normocephalic and atraumatic.  Eyes:     Conjunctiva/sclera: Conjunctivae normal.  Cardiovascular:     Rate and Rhythm: Normal rate and regular rhythm.     Heart sounds: No murmur.  Pulmonary:     Effort: Pulmonary effort is normal. No respiratory distress.     Breath sounds: Normal breath sounds.  Abdominal:     Palpations: Abdomen is soft.     Tenderness: There is no abdominal tenderness.  Musculoskeletal:  General: Tenderness and signs of injury present.     Cervical back: Neck supple.     Comments: He has full range of motion of bilateral upper extremities without any pain or limitations.  He is got bruising on his forearms.  Full range of motion of right lower extremity without any pain or limitations.  He has tenderness shortening and external rotation of his left leg.  Nontender knee and ankle.  Distal neurovascular intact.  Skin:    General: Skin is warm and dry.     Capillary Refill: Capillary refill takes less than 2 seconds.  Neurological:     General: No focal deficit present.     Mental Status: He is alert. Mental status is at baseline.     ED Results / Procedures  / Treatments   Labs (all labs ordered are listed, but only abnormal results are displayed) Labs Reviewed  CBC - Abnormal; Notable for the following components:      Result Value   RBC 3.75 (*)    Hemoglobin 12.4 (*)    HCT 36.6 (*)    All other components within normal limits  URINALYSIS, ROUTINE W REFLEX MICROSCOPIC - Abnormal; Notable for the following components:   Protein, ur 100 (*)    All other components within normal limits  PROTIME-INR - Abnormal; Notable for the following components:   Prothrombin Time 31.0 (*)    INR 3.0 (*)    All other components within normal limits  COMPREHENSIVE METABOLIC PANEL - Abnormal; Notable for the following components:   Potassium 3.4 (*)    Glucose, Bld 106 (*)    BUN 25 (*)    Total Bilirubin 1.5 (*)    All other components within normal limits  PROTIME-INR - Abnormal; Notable for the following components:   Prothrombin Time 19.5 (*)    INR 1.7 (*)    All other components within normal limits  BASIC METABOLIC PANEL - Abnormal; Notable for the following components:   Potassium 3.4 (*)    Glucose, Bld 124 (*)    BUN 26 (*)    All other components within normal limits  CBC - Abnormal; Notable for the following components:   RBC 3.75 (*)    Hemoglobin 12.3 (*)    HCT 36.1 (*)    All other components within normal limits  SARS CORONAVIRUS 2 (TAT 6-24 HRS)  SURGICAL PCR SCREEN  ETHANOL  LACTIC ACID, PLASMA  MAGNESIUM  PROTIME-INR  I-STAT CHEM 8, ED  TYPE AND SCREEN  ABO/RH    EKG EKG Interpretation  Date/Time:  Thursday June 30 2019 16:04:42 EDT Ventricular Rate:  60 PR Interval:    QRS Duration: 192 QT Interval:  493 QTC Calculation: 493 R Axis:   94 Text Interpretation: Sinus rhythm Short PR interval Consider left ventricular hypertrophy Borderline prolonged QT interval v paced Confirmed by Aletta Edouard [phone]) on 06/30/2019 4:12:42 PM   Radiology CT HEAD WO CONTRAST  Result Date: 06/30/2019 CLINICAL DATA:   Recent fall with headaches and neck pain, initial encounter EXAM: CT HEAD WITHOUT CONTRAST CT CERVICAL SPINE WITHOUT CONTRAST TECHNIQUE: Multidetector CT imaging of the head and cervical spine was performed following the standard protocol without intravenous contrast. Multiplanar CT image reconstructions of the cervical spine were also generated. COMPARISON:  None. FINDINGS: CT HEAD FINDINGS Brain: No evidence of acute infarction, hemorrhage, hydrocephalus, extra-axial collection or mass lesion/mass effect. Chronic atrophic and ischemic changes are noted. Vascular: No hyperdense vessel or unexpected calcification. Skull: Normal.  Negative for fracture or focal lesion. Sinuses/Orbits: No acute finding. Other: Nasal bone fractures are seen but of uncertain chronicity. Correlation with the physical exam is recommended. CT CERVICAL SPINE FINDINGS Alignment: Within normal limits. Skull base and vertebrae: 7 cervical segments are well visualized. Vertebral body height is well maintained. Multilevel disc space narrowing with osteophytic changes are seen. Multilevel facet hypertrophic changes are noted. No acute fracture or acute facet abnormality is noted. Soft tissues and spinal canal: Surrounding soft tissue structures show no acute abnormality. Vascular calcifications are seen. Scattered small hypodensities are noted within the thyroid measuring less than 1 cm. Upper chest: Visualized lung apices demonstrate emphysematous change. Other: None IMPRESSION: CT of the head: Chronic atrophic and ischemic changes without acute intracranial abnormality Bilateral nasal bone fractures of uncertain chronicity. Correlation with the physical exam is recommended. CT of the cervical spine: Multilevel degenerative change without acute bony abnormality. Scattered small hypodensities within the thyroid gland. Given the patient's age, no follow-up recommended unless clinically warranted (ref: J Am Coll Radiol. 2015 Feb;12(2): 143-50).  Electronically Signed   By: Inez Catalina M.D.   On: 06/30/2019 18:20   CT CERVICAL SPINE WO CONTRAST  Result Date: 06/30/2019 CLINICAL DATA:  Recent fall with headaches and neck pain, initial encounter EXAM: CT HEAD WITHOUT CONTRAST CT CERVICAL SPINE WITHOUT CONTRAST TECHNIQUE: Multidetector CT imaging of the head and cervical spine was performed following the standard protocol without intravenous contrast. Multiplanar CT image reconstructions of the cervical spine were also generated. COMPARISON:  None. FINDINGS: CT HEAD FINDINGS Brain: No evidence of acute infarction, hemorrhage, hydrocephalus, extra-axial collection or mass lesion/mass effect. Chronic atrophic and ischemic changes are noted. Vascular: No hyperdense vessel or unexpected calcification. Skull: Normal. Negative for fracture or focal lesion. Sinuses/Orbits: No acute finding. Other: Nasal bone fractures are seen but of uncertain chronicity. Correlation with the physical exam is recommended. CT CERVICAL SPINE FINDINGS Alignment: Within normal limits. Skull base and vertebrae: 7 cervical segments are well visualized. Vertebral body height is well maintained. Multilevel disc space narrowing with osteophytic changes are seen. Multilevel facet hypertrophic changes are noted. No acute fracture or acute facet abnormality is noted. Soft tissues and spinal canal: Surrounding soft tissue structures show no acute abnormality. Vascular calcifications are seen. Scattered small hypodensities are noted within the thyroid measuring less than 1 cm. Upper chest: Visualized lung apices demonstrate emphysematous change. Other: None IMPRESSION: CT of the head: Chronic atrophic and ischemic changes without acute intracranial abnormality Bilateral nasal bone fractures of uncertain chronicity. Correlation with the physical exam is recommended. CT of the cervical spine: Multilevel degenerative change without acute bony abnormality. Scattered small hypodensities within the  thyroid gland. Given the patient's age, no follow-up recommended unless clinically warranted (ref: J Am Coll Radiol. 2015 Feb;12(2): 143-50). Electronically Signed   By: Inez Catalina M.D.   On: 06/30/2019 18:20   DG Pelvis Portable  Result Date: 06/30/2019 CLINICAL DATA:  86 year old who fell earlier today. Patient is currently anticoagulated. EXAM: PORTABLE PELVIS 1-2 VIEWS COMPARISON:  Bone window images from CT abdomen and pelvis 05/19/2016. FINDINGS: Foreshortening of the LEFT femoral neck, either due to fracture or patient position. No fractures elsewhere. Hip joints anatomically aligned with well-preserved joint spaces for patient age. Sacroiliac joints and symphysis pubis anatomically aligned without diastasis. Degenerative changes involving the sacroiliac joints and the visualized lower lumbar spine, unchanged since the prior CT. Brachytherapy seeds in the prostate gland. IMPRESSION: Foreshortening of the LEFT femoral neck, either due to fracture or patient  position. Dedicated LEFT hip x-rays are recommended. Electronically Signed   By: Evangeline Dakin M.D.   On: 06/30/2019 16:22   DG Chest Port 1 View  Result Date: 06/30/2019 CLINICAL DATA:  86 year old who fell earlier today. Patient is currently anticoagulated. EXAM: PORTABLE CHEST 1 VIEW COMPARISON:  05/16/2016 and earlier. FINDINGS: Cardiac silhouette moderately to markedly enlarged for AP portable technique, increased in size since 2018. Thoracic aorta mildly atherosclerotic, unchanged. RIGHT subclavian single lead transvenous pacemaker, unchanged. Lungs clear. Bronchovascular markings normal. Pulmonary vascularity normal. No visible pleural effusions. No pneumothorax. IMPRESSION: 1. Moderate to marked cardiomegaly, increased in size since 2018. 2. No acute cardiopulmonary disease. Electronically Signed   By: Evangeline Dakin M.D.   On: 06/30/2019 16:19   DG Hip Unilat W or Wo Pelvis 2-3 Views Left  Result Date: 06/30/2019 CLINICAL DATA:   86 year old who fell and has LEFT hip pain. EXAM: DG HIP (WITH OR WITHOUT PELVIS) 2-3V LEFT COMPARISON:  No prior LEFT hip x-rays. Bone window images from CT abdomen and pelvis 05/19/2016. FINDINGS: Acute subcapital or basicervical LEFT femoral neck fracture. Hip joint anatomically aligned with well-preserved joint space. Bone mineral density well preserved for patient age. IMPRESSION: Acute subcapital or basicervical LEFT femoral neck fracture. Electronically Signed   By: Evangeline Dakin M.D.   On: 06/30/2019 16:49    Procedures Procedures (including critical care time)  Medications Ordered in ED Medications  morphine 2 MG/ML injection 0.5 mg (0.5 mg Intravenous Given 07/01/19 0928)  hydrALAZINE (APRESOLINE) injection 10 mg (has no administration in time range)  phytonadione (VITAMIN K) 5 mg in dextrose 5 % 50 mL IVPB (5 mg Intravenous New Bag/Given 07/01/19 0900)  fentaNYL (SUBLIMAZE) injection 50 mcg (50 mcg Intravenous Given 06/30/19 2123)  phytonadione (VITAMIN K) 10 mg in dextrose 5 % 50 mL IVPB (0 mg Intravenous Stopped 06/30/19 2036)    ED Course  I have reviewed the triage vital signs and the nursing notes.  Pertinent labs & imaging results that were available during my care of the patient were reviewed by me and considered in my medical decision making (see chart for details).  Clinical Course as of Jun 30 945  Thu Jun 30, 2019  1613 Chest x-ray interpreted by me as pacemaker cardiomegaly no infiltrate or pneumothorax.  Pelvis x-ray interpreted by me as likely left femoral neck fracture   [MB]  1638 Patient's son Dr. [name] is here now.  He says that he stopped quickly and his father bumped into him.  He says when he pivoted he fell to the ground but did not strike his head.   [MB]  [id number] Discussed with Dr. Sharol Given who will pass the information on to Dr. [name] for probable surgery tomorrow.   [MB]  1826 CT head and cervical spine not showing any acute fractures.  Patient's blood  work has been slow to come back because of hemolyzing.   [MB]  1902 Discussed  with [facility] hospitalist Dr. Hal Hope who will evaluate the patient for admission.   [MB]    Clinical Course User Index [MB] Hayden Rasmussen, MD   MDM Rules/Calculators/A&P                     This patient complains of fall and left hip pain; this involves an extensive number of treatment Options and is a complaint that carries with it a high risk of complications and Morbidity. The differential includes hip fracture, hip dislocation, intracranial bleed, cervical spine fracture, anemia  or metabolic derangement  I ordered, reviewed and interpreted labs, which included hemoglobin mildly low at 12.4.  Chemistries showing a low potassium 3.4.  INR elevated at 3.  Covid testing negative. I ordered medication fentanyl as needed for pain I ordered imaging studies which included plain film chest pelvis and left hip along with CT imaging of head and cervical spine and I independently    visualized and interpreted imaging which showed acute subcapital left hip fracture.  Other imaging significant acute findings Additional history obtained from patient's son Dr. [name] Previous records obtained and reviewed in epic I consulted orthopedics Dr. Sharol Given and Dr. [name] along with [facility] hospitalist Dr. Hal Hope and discussed lab and imaging findings  After the interventions stated above, I reevaluated the patient and found patient's pain to be controlled and explained the need for admission for likely operative repair of his hip fracture patient and son in agreement with plan  Final Clinical Impression(s) / ED Diagnoses Final diagnoses:  Closed left hip fracture, initial encounter [facility])  Fall, initial encounter    Rx / DC Orders ED Discharge Orders    None       Hayden Rasmussen, MD 07/01/19 [phone]

## 2019-07-01 ENCOUNTER — Inpatient Hospital Stay (HOSPITAL_COMMUNITY): Payer: Medicare Other

## 2019-07-01 ENCOUNTER — Inpatient Hospital Stay (HOSPITAL_COMMUNITY): Payer: Medicare Other | Admitting: Certified Registered Nurse Anesthetist

## 2019-07-01 ENCOUNTER — Encounter (HOSPITAL_COMMUNITY)
Admission: EM | Disposition: A | Discharge status: Home Health | Payer: Self-pay | Source: Home / Self Care | Attending: Internal Medicine

## 2019-07-01 ENCOUNTER — Encounter (HOSPITAL_COMMUNITY): Payer: Self-pay | Admitting: Internal Medicine

## 2019-07-01 DIAGNOSIS — S72042A Displaced fracture of base of neck of left femur, initial encounter for closed fracture: Secondary | ICD-10-CM | POA: Diagnosis not present

## 2019-07-01 DIAGNOSIS — S72002A Fracture of unspecified part of neck of left femur, initial encounter for closed fracture: Secondary | ICD-10-CM | POA: Diagnosis not present

## 2019-07-01 DIAGNOSIS — I4821 Permanent atrial fibrillation: Secondary | ICD-10-CM

## 2019-07-01 DIAGNOSIS — D649 Anemia, unspecified: Secondary | ICD-10-CM | POA: Diagnosis present

## 2019-07-01 DIAGNOSIS — Z950 Presence of cardiac pacemaker: Secondary | ICD-10-CM | POA: Diagnosis present

## 2019-07-01 DIAGNOSIS — S72009A Fracture of unspecified part of neck of unspecified femur, initial encounter for closed fracture: Secondary | ICD-10-CM | POA: Diagnosis present

## 2019-07-01 DIAGNOSIS — I4891 Unspecified atrial fibrillation: Secondary | ICD-10-CM | POA: Diagnosis present

## 2019-07-01 DIAGNOSIS — I10 Essential (primary) hypertension: Secondary | ICD-10-CM | POA: Diagnosis present

## 2019-07-01 HISTORY — PX: TOTAL HIP ARTHROPLASTY: SHX124

## 2019-07-01 HISTORY — PX: FINE NEEDLE ASPIRATION: SHX6590

## 2019-07-01 LAB — CBC
HCT: 36.1 % — ABNORMAL LOW (ref 39.0–52.0)
Hemoglobin: 12.3 g/dL — ABNORMAL LOW (ref 13.0–17.0)
MCH: 32.8 pg (ref 26.0–34.0)
MCHC: 34.1 g/dL (ref 30.0–36.0)
MCV: 96.3 fL (ref 80.0–100.0)
Platelets: 235 10*3/uL (ref 150–400)
RBC: 3.75 MIL/uL — ABNORMAL LOW (ref 4.22–5.81)
RDW: 13.1 % (ref 11.5–15.5)
WBC: 8.8 10*3/uL (ref 4.0–10.5)
nRBC: 0 % (ref 0.0–0.2)

## 2019-07-01 LAB — POCT I-STAT, CHEM 8
BUN: 42 mg/dL — ABNORMAL HIGH (ref 8–23)
Calcium, Ion: 1.1 mmol/L — ABNORMAL LOW (ref 1.15–1.40)
Chloride: 102 mmol/L (ref 98–111)
Creatinine, Ser: 0.9 mg/dL (ref 0.61–1.24)
Glucose, Bld: 100 mg/dL — ABNORMAL HIGH (ref 70–99)
HCT: 36 % — ABNORMAL LOW (ref 39.0–52.0)
Hemoglobin: 12.2 g/dL — ABNORMAL LOW (ref 13.0–17.0)
Potassium: 6.5 mmol/L (ref 3.5–5.1)
Sodium: 132 mmol/L — ABNORMAL LOW (ref 135–145)
TCO2: 27 mmol/L (ref 22–32)

## 2019-07-01 LAB — PROTIME-INR
INR: 1.7 — ABNORMAL HIGH (ref 0.8–1.2)
INR: 1.4 — ABNORMAL HIGH (ref 0.8–1.2)
Prothrombin Time: 19.5 seconds — ABNORMAL HIGH (ref 11.4–15.2)
Prothrombin Time: 16.9 seconds — ABNORMAL HIGH (ref 11.4–15.2)

## 2019-07-01 LAB — BASIC METABOLIC PANEL
Anion gap: 10 (ref 5–15)
BUN: 26 mg/dL — ABNORMAL HIGH (ref 8–23)
CO2: 23 mmol/L (ref 22–32)
Calcium: 9 mg/dL (ref 8.9–10.3)
Chloride: 103 mmol/L (ref 98–111)
Creatinine, Ser: 0.88 mg/dL (ref 0.61–1.24)
GFR calc Af Amer: >60 mL/min (ref >60)
GFR calc non Af Amer: >60 mL/min (ref >60)
Glucose, Bld: 124 mg/dL — ABNORMAL HIGH (ref 70–99)
Potassium: 3.4 mmol/L — ABNORMAL LOW (ref 3.5–5.1)
Sodium: 136 mmol/L (ref 135–145)

## 2019-07-01 LAB — MAGNESIUM: Magnesium: 1.8 mg/dL (ref 1.7–2.4)

## 2019-07-01 SURGERY — ARTHROPLASTY, HIP, TOTAL, ANTERIOR APPROACH
Anesthesia: General | Site: Knee | Laterality: Left

## 2019-07-01 MED ORDER — LACTATED RINGERS IV SOLN: INTRAVENOUS | Status: DC

## 2019-07-01 MED ORDER — ONDANSETRON HCL 4 MG/2ML IJ SOLN
INTRAMUSCULAR | Status: AC
  Filled 2019-07-01: qty 2

## 2019-07-01 MED ORDER — ROCURONIUM BROMIDE 10 MG/ML (PF) SYRINGE
PREFILLED_SYRINGE | INTRAVENOUS | Status: AC
  Filled 2019-07-01: qty 10

## 2019-07-01 MED ORDER — DEXAMETHASONE SODIUM PHOSPHATE 10 MG/ML IJ SOLN
INTRAMUSCULAR | Status: DC | PRN
  Administered 2019-07-01: 4 mg via INTRAVENOUS

## 2019-07-01 MED ORDER — DIPHENHYDRAMINE HCL 12.5 MG/5ML PO ELIX: 12.5000 mg | ORAL_SOLUTION | ORAL | Status: DC | PRN

## 2019-07-01 MED ORDER — DEXMEDETOMIDINE HCL 200 MCG/2ML IV SOLN
INTRAVENOUS | Status: DC | PRN
  Administered 2019-07-01: 8 ug via INTRAVENOUS

## 2019-07-01 MED ORDER — SODIUM CHLORIDE 0.9 % IV SOLN: INTRAVENOUS | Status: DC

## 2019-07-01 MED ORDER — METHOCARBAMOL 500 MG PO TABS: 500.0000 mg | ORAL_TABLET | Freq: Four times a day (QID) | ORAL | Status: DC | PRN

## 2019-07-01 MED ORDER — MORPHINE SULFATE (PF) 2 MG/ML IV SOLN
0.5000 mg | INTRAVENOUS | Status: DC | PRN
  Administered 2019-07-01 (×3): 0.5 mg via INTRAVENOUS
  Filled 2019-07-01 (×3): qty 1

## 2019-07-01 MED ORDER — ONDANSETRON HCL 4 MG/2ML IJ SOLN
INTRAMUSCULAR | Status: DC | PRN
  Administered 2019-07-01: 4 mg via INTRAVENOUS

## 2019-07-01 MED ORDER — WARFARIN SODIUM 2 MG PO TABS
2.0000 mg | ORAL_TABLET | Freq: Once | ORAL | Status: AC
  Administered 2019-07-01: 2 mg via ORAL
  Filled 2019-07-01: qty 1

## 2019-07-01 MED ORDER — HYDROCODONE-ACETAMINOPHEN 5-325 MG PO TABS: 1.0000 | ORAL_TABLET | ORAL | Status: DC | PRN

## 2019-07-01 MED ORDER — CEFAZOLIN SODIUM-DEXTROSE 2-4 GM/100ML-% IV SOLN
2.0000 g | Freq: Once | INTRAVENOUS | Status: AC
  Administered 2019-07-01: 2 g via INTRAVENOUS

## 2019-07-01 MED ORDER — MORPHINE SULFATE (PF) 2 MG/ML IV SOLN: 0.5000 mg | INTRAVENOUS | Status: DC | PRN

## 2019-07-01 MED ORDER — ACETAMINOPHEN 10 MG/ML IV SOLN
INTRAVENOUS | Status: AC
  Filled 2019-07-01: qty 100

## 2019-07-01 MED ORDER — ONDANSETRON HCL 4 MG/2ML IJ SOLN: 4.0000 mg | Freq: Four times a day (QID) | INTRAMUSCULAR | Status: DC | PRN

## 2019-07-01 MED ORDER — PHENOL 1.4 % MT LIQD: 1.0000 | OROMUCOSAL | Status: DC | PRN

## 2019-07-01 MED ORDER — TRANEXAMIC ACID-NACL 1000-0.7 MG/100ML-% IV SOLN
INTRAVENOUS | Status: DC | PRN
  Administered 2019-07-01: 1000 mg via INTRAVENOUS

## 2019-07-01 MED ORDER — TRANEXAMIC ACID-NACL 1000-0.7 MG/100ML-% IV SOLN
INTRAVENOUS | Status: AC
  Filled 2019-07-01: qty 100

## 2019-07-01 MED ORDER — CEFAZOLIN SODIUM-DEXTROSE 1-4 GM/50ML-% IV SOLN
1.0000 g | Freq: Four times a day (QID) | INTRAVENOUS | Status: AC
  Administered 2019-07-01 – 2019-07-02 (×2): 1 g via INTRAVENOUS
  Filled 2019-07-01 (×2): qty 50

## 2019-07-01 MED ORDER — FENTANYL CITRATE (PF) 250 MCG/5ML IJ SOLN
INTRAMUSCULAR | Status: DC | PRN
  Administered 2019-07-01 (×3): 50 ug via INTRAVENOUS

## 2019-07-01 MED ORDER — FENTANYL CITRATE (PF) 250 MCG/5ML IJ SOLN
INTRAMUSCULAR | Status: AC
  Filled 2019-07-01: qty 5

## 2019-07-01 MED ORDER — MENTHOL 3 MG MT LOZG: 1.0000 | LOZENGE | OROMUCOSAL | Status: DC | PRN

## 2019-07-01 MED ORDER — SODIUM CHLORIDE 0.9 % IR SOLN
Status: DC | PRN
  Administered 2019-07-01: 3000 mL

## 2019-07-01 MED ORDER — HYDRALAZINE HCL 20 MG/ML IJ SOLN: 10.0000 mg | INTRAMUSCULAR | Status: DC | PRN

## 2019-07-01 MED ORDER — PROPOFOL 10 MG/ML IV BOLUS
INTRAVENOUS | Status: AC
  Filled 2019-07-01: qty 20

## 2019-07-01 MED ORDER — HYDROCODONE-ACETAMINOPHEN 7.5-325 MG PO TABS: 1.0000 | ORAL_TABLET | ORAL | Status: DC | PRN

## 2019-07-01 MED ORDER — WARFARIN - PHARMACIST DOSING INPATIENT: Freq: Every day | Status: DC

## 2019-07-01 MED ORDER — FENTANYL CITRATE (PF) 100 MCG/2ML IJ SOLN: 25.0000 ug | INTRAMUSCULAR | Status: DC | PRN

## 2019-07-01 MED ORDER — PHENYLEPHRINE 40 MCG/ML (10ML) SYRINGE FOR IV PUSH (FOR BLOOD PRESSURE SUPPORT)
PREFILLED_SYRINGE | INTRAVENOUS | Status: DC | PRN
  Administered 2019-07-01: 120 ug via INTRAVENOUS

## 2019-07-01 MED ORDER — ONDANSETRON HCL 4 MG PO TABS: 4.0000 mg | ORAL_TABLET | Freq: Four times a day (QID) | ORAL | Status: DC | PRN

## 2019-07-01 MED ORDER — PROPOFOL 10 MG/ML IV BOLUS
INTRAVENOUS | Status: DC | PRN
  Administered 2019-07-01: 100 mg via INTRAVENOUS

## 2019-07-01 MED ORDER — CEFAZOLIN SODIUM-DEXTROSE 2-4 GM/100ML-% IV SOLN
INTRAVENOUS | Status: AC
  Filled 2019-07-01: qty 100

## 2019-07-01 MED ORDER — VITAMIN K1 10 MG/ML IJ SOLN
5.0000 mg | Freq: Once | INTRAVENOUS | Status: AC
  Administered 2019-07-01: 5 mg via INTRAVENOUS
  Filled 2019-07-01: qty 0.5

## 2019-07-01 MED ORDER — METHOCARBAMOL 1000 MG/10ML IJ SOLN
500.0000 mg | Freq: Four times a day (QID) | INTRAVENOUS | Status: DC | PRN
  Filled 2019-07-01: qty 5

## 2019-07-01 MED ORDER — LIDOCAINE 2% (20 MG/ML) 5 ML SYRINGE
INTRAMUSCULAR | Status: DC | PRN
  Administered 2019-07-01: 60 mg via INTRAVENOUS

## 2019-07-01 MED ORDER — 0.9 % SODIUM CHLORIDE (POUR BTL) OPTIME
TOPICAL | Status: DC | PRN
  Administered 2019-07-01: 15:00:00 1000 mL

## 2019-07-01 MED ORDER — METOCLOPRAMIDE HCL 5 MG PO TABS: 5.0000 mg | ORAL_TABLET | Freq: Three times a day (TID) | ORAL | Status: DC | PRN

## 2019-07-01 MED ORDER — PANTOPRAZOLE SODIUM 40 MG PO TBEC
40.0000 mg | DELAYED_RELEASE_TABLET | Freq: Every day | ORAL | Status: DC
  Administered 2019-07-02 – 2019-07-06 (×5): 40 mg via ORAL
  Filled 2019-07-01 (×5): qty 1

## 2019-07-01 MED ORDER — DOCUSATE SODIUM 100 MG PO CAPS
100.0000 mg | ORAL_CAPSULE | Freq: Two times a day (BID) | ORAL | Status: DC
  Administered 2019-07-01 – 2019-07-06 (×10): 100 mg via ORAL
  Filled 2019-07-01 (×10): qty 1

## 2019-07-01 MED ORDER — PROMETHAZINE HCL 25 MG/ML IJ SOLN: 6.2500 mg | INTRAMUSCULAR | Status: DC | PRN

## 2019-07-01 MED ORDER — ADULT MULTIVITAMIN W/MINERALS CH
1.0000 | ORAL_TABLET | Freq: Every day | ORAL | Status: DC
  Administered 2019-07-02 – 2019-07-06 (×5): 1 via ORAL
  Filled 2019-07-01 (×5): qty 1

## 2019-07-01 MED ORDER — POTASSIUM CHLORIDE CRYS ER 20 MEQ PO TBCR: 20.0000 meq | EXTENDED_RELEASE_TABLET | Freq: Once | ORAL | Status: DC

## 2019-07-01 MED ORDER — ENSURE ENLIVE PO LIQD
237.0000 mL | ORAL | Status: DC
  Administered 2019-07-02 – 2019-07-05 (×4): 237 mL via ORAL

## 2019-07-01 MED ORDER — ALUM & MAG HYDROXIDE-SIMETH 200-200-20 MG/5ML PO SUSP: 30.0000 mL | ORAL | Status: DC | PRN

## 2019-07-01 MED ORDER — ROCURONIUM BROMIDE 10 MG/ML (PF) SYRINGE
PREFILLED_SYRINGE | INTRAVENOUS | Status: DC | PRN
  Administered 2019-07-01: 50 mg via INTRAVENOUS
  Administered 2019-07-01: 10 mg via INTRAVENOUS

## 2019-07-01 MED ORDER — ACETAMINOPHEN 10 MG/ML IV SOLN
INTRAVENOUS | Status: DC | PRN
  Administered 2019-07-01: 1000 mg via INTRAVENOUS

## 2019-07-01 MED ORDER — DEXAMETHASONE SODIUM PHOSPHATE 10 MG/ML IJ SOLN
INTRAMUSCULAR | Status: AC
  Filled 2019-07-01: qty 1

## 2019-07-01 MED ORDER — ACETAMINOPHEN 325 MG PO TABS
325.0000 mg | ORAL_TABLET | Freq: Four times a day (QID) | ORAL | Status: DC | PRN
  Administered 2019-07-03: 325 mg via ORAL
  Administered 2019-07-05: 650 mg via ORAL
  Filled 2019-07-01 (×2): qty 2

## 2019-07-01 MED ORDER — METOCLOPRAMIDE HCL 5 MG/ML IJ SOLN: 5.0000 mg | Freq: Three times a day (TID) | INTRAMUSCULAR | Status: DC | PRN

## 2019-07-01 MED ORDER — LIDOCAINE 2% (20 MG/ML) 5 ML SYRINGE
INTRAMUSCULAR | Status: AC
  Filled 2019-07-01: qty 5

## 2019-07-01 SURGICAL SUPPLY — 71 items
ACETAB CUP W GRIPTION 54MM (Plate) ×1 IMPLANT
ACETAB CUP W/GRIPTION 54 (Plate) ×4 IMPLANT
ARTICULEZE HEAD (Hips) ×5 IMPLANT
BLADE CLIPPER SURG (BLADE) IMPLANT
BLADE SAGITTAL (BLADE)
BLADE SAW SGTL 18X1.27X75 (BLADE) ×4 IMPLANT
BLADE SAW SGTL 18X1.27X75MM (BLADE) ×1
BLADE SAW THK.89X75X18XSGTL (BLADE) ×2 IMPLANT
CLOSURE WOUND 1/2 X4 (GAUZE/BANDAGES/DRESSINGS)
COVER SURGICAL LIGHT HANDLE (MISCELLANEOUS) ×5 IMPLANT
COVER WAND RF STERILE (DRAPES) ×2 IMPLANT
CUP ACETAB W/GRIPTION 54 (Plate) ×1 IMPLANT
DRAPE C-ARM 42X72 X-RAY (DRAPES) ×1 IMPLANT
DRAPE HIP W/POCKET STRL (MISCELLANEOUS) ×2 IMPLANT
DRAPE IMP U-DRAPE 54X76 (DRAPES) ×2 IMPLANT
DRAPE INCISE IOBAN 85X60 (DRAPES) ×10 IMPLANT
DRAPE STERI IOBAN 125X83 (DRAPES) ×5 IMPLANT
DRAPE U-SHAPE 47X51 STRL (DRAPES) ×15 IMPLANT
DRSG AQUACEL AG ADV 3.5X10 (GAUZE/BANDAGES/DRESSINGS) ×5 IMPLANT
DRSG MEPILEX BORDER 4X8 (GAUZE/BANDAGES/DRESSINGS) ×5 IMPLANT
DRSG XEROFORM 1X8 (GAUZE/BANDAGES/DRESSINGS) ×3 IMPLANT
DURAPREP 26ML APPLICATOR (WOUND CARE) ×5 IMPLANT
ELECT BLADE 4.0 EZ CLEAN MEGAD (MISCELLANEOUS) ×5
ELECT BLADE 6.5 EXT (BLADE) IMPLANT
ELECT CAUTERY BLADE 6.4 (BLADE) ×1 IMPLANT
ELECT REM PT RETURN 9FT ADLT (ELECTROSURGICAL) ×5
ELECTRODE BLDE 4.0 EZ CLN MEGD (MISCELLANEOUS) ×3 IMPLANT
ELECTRODE REM PT RTRN 9FT ADLT (ELECTROSURGICAL) ×3 IMPLANT
EVACUATOR 1/8 PVC DRAIN (DRAIN) IMPLANT
FACESHIELD WRAPAROUND (MASK) ×15 IMPLANT
FACESHIELD WRAPAROUND OR TEAM (MASK) ×4 IMPLANT
GLOVE BIOGEL PI IND STRL 7.5 (GLOVE) ×3 IMPLANT
GLOVE BIOGEL PI IND STRL 8 (GLOVE) ×6 IMPLANT
GLOVE BIOGEL PI INDICATOR 7.5 (GLOVE) ×2
GLOVE BIOGEL PI INDICATOR 8 (GLOVE) ×4
GLOVE ECLIPSE 8.0 STRL XLNG CF (GLOVE) ×5 IMPLANT
GLOVE ORTHO TXT STRL SZ7.5 (GLOVE) ×10 IMPLANT
GOWN STRL REUS W/ TWL LRG LVL3 (GOWN DISPOSABLE) ×5 IMPLANT
GOWN STRL REUS W/ TWL XL LVL3 (GOWN DISPOSABLE) ×10 IMPLANT
GOWN STRL REUS W/TWL LRG LVL3 (GOWN DISPOSABLE) ×5
GOWN STRL REUS W/TWL XL LVL3 (GOWN DISPOSABLE) ×10
HANDPIECE INTERPULSE COAX TIP (DISPOSABLE) ×5
HEAD ARTICULEZE (Hips) ×1 IMPLANT
KIT BASIN OR (CUSTOM PROCEDURE TRAY) ×5 IMPLANT
KIT TURNOVER KIT B (KITS) ×5 IMPLANT
LINER NEUTRAL 36ID 54OD (Liner) ×3 IMPLANT
MANIFOLD NEPTUNE II (INSTRUMENTS) ×5 IMPLANT
NDL SAFETY ECLIPSE 18X1.5 (NEEDLE) ×1 IMPLANT
NEEDLE HYPO 18GX1.5 SHARP (NEEDLE) ×5
NS IRRIG 1000ML POUR BTL (IV SOLUTION) ×5 IMPLANT
PACK TOTAL JOINT (CUSTOM PROCEDURE TRAY) ×5 IMPLANT
PAD ARMBOARD 7.5X6 YLW CONV (MISCELLANEOUS) ×10 IMPLANT
SET HNDPC FAN SPRY TIP SCT (DISPOSABLE) ×3 IMPLANT
STAPLER VISISTAT 35W (STAPLE) ×5 IMPLANT
STEM CORAIL KA13 (Stem) ×3 IMPLANT
STRIP CLOSURE SKIN 1/2X4 (GAUZE/BANDAGES/DRESSINGS) ×4 IMPLANT
SUT ETHIBOND NAB CT1 #1 30IN (SUTURE) ×7 IMPLANT
SUT VIC AB 0 CT1 27 (SUTURE) ×5
SUT VIC AB 0 CT1 27XBRD ANBCTR (SUTURE) ×3 IMPLANT
SUT VIC AB 1 CT1 27 (SUTURE) ×5
SUT VIC AB 1 CT1 27XBRD ANBCTR (SUTURE) ×3 IMPLANT
SUT VIC AB 2-0 CT1 27 (SUTURE) ×10
SUT VIC AB 2-0 CT1 TAPERPNT 27 (SUTURE) ×6 IMPLANT
SYR 50ML LL SCALE MARK (SYRINGE) ×6 IMPLANT
TOWEL GREEN STERILE (TOWEL DISPOSABLE) ×5 IMPLANT
TOWEL GREEN STERILE FF (TOWEL DISPOSABLE) ×5 IMPLANT
TRAY CATH 16FR W/PLASTIC CATH (SET/KITS/TRAYS/PACK) IMPLANT
TRAY FOLEY SLVR 16FR LF STAT (SET/KITS/TRAYS/PACK) ×3 IMPLANT
TRAY FOLEY W/BAG SLVR 16FR (SET/KITS/TRAYS/PACK)
TRAY FOLEY W/BAG SLVR 16FR ST (SET/KITS/TRAYS/PACK) IMPLANT
WATER STERILE IRR 1000ML POUR (IV SOLUTION) ×7 IMPLANT

## 2019-07-01 NOTE — Progress Notes (Signed)
Report received from [employer]. RN  The patient is scheduled for surgery later today.  His son is present in the room and given updates.  The patient is alert and oriented but hard of hearing.

## 2019-07-01 NOTE — Transfer of Care (Signed)
Immediate Anesthesia Transfer of Care Note  Patient: Manuel Lewis  Procedure(s) Performed: ANTERIOR TOTAL HIP ARTHROPLASTY (Left Hip) Fine Needle Aspiration of left knee (Left Knee)  Patient Location: PACU  Anesthesia Type:General  Level of Consciousness: awake  Airway & Oxygen Therapy: Patient Spontanous Breathing and Patient connected to nasal cannula oxygen  Post-op Assessment: Report given to RN and Post -op Vital signs reviewed and stable  Post vital signs: Reviewed and stable  Last Vitals:  Vitals Value Taken Time  BP 141/68 07/01/19 1627  Temp    Pulse 65 07/01/19 1631  Resp 24 07/01/19 1631  SpO2 95 % 07/01/19 1631  Vitals shown include unvalidated device data.  Last Pain:  Vitals:   07/01/19 1407  TempSrc:   PainSc: Asleep         Complications: No apparent anesthesia complications

## 2019-07-01 NOTE — Brief Op Note (Signed)
06/30/2019 - 07/01/2019  4:06 PM  PATIENT:  Anastasia Fiedler  87 y.o. male  PRE-OPERATIVE DIAGNOSIS:  left hip fracture  POST-OPERATIVE DIAGNOSIS:  left hip fracture  PROCEDURE:  Procedure(s): ANTERIOR TOTAL HIP ARTHROPLASTY (Left) Fine Needle Aspiration of left knee (Left)  SURGEON:  Surgeon(s) and Role:    Mcarthur Rossetti, MD - Primary  PHYSICIAN ASSISTANT: Benita Stabile, PA-C  ANESTHESIA:   general  EBL:  150 mL   COUNTS:  YES  DICTATION: .Other Dictation: Dictation Number [id number]  PLAN OF CARE: Admit to inpatient   PATIENT DISPOSITION:  PACU - hemodynamically stable.   Delay start of Pharmacological VTE agent (>24hrs) due to surgical blood loss or risk of bleeding: no

## 2019-07-01 NOTE — Anesthesia Preprocedure Evaluation (Signed)
Anesthesia Evaluation  Patient identified by MRN, date of birth, ID band Patient awake    Reviewed: Allergy & Precautions, NPO status , Patient's Chart, lab work & pertinent test results  Airway Mallampati: II  TM Distance: >3 FB     Dental  (+) Dental Advisory Given   Pulmonary neg pulmonary ROS,    breath sounds clear to auscultation       Cardiovascular hypertension, Pt. on medications + dysrhythmias Atrial Fibrillation + pacemaker  Rhythm:Irregular Rate:Normal     Neuro/Psych negative neurological ROS     GI/Hepatic negative GI ROS, Neg liver ROS,   Endo/Other  negative endocrine ROS  Renal/GU negative Renal ROS     Musculoskeletal   Abdominal   Peds  Hematology  (+) anemia ,   Anesthesia Other Findings   Reproductive/Obstetrics                             Lab Results  Component Value Date   WBC 8.8 07/01/2019   HGB 12.3 (L) 07/01/2019   HCT 36.1 (L) 07/01/2019   MCV 96.3 07/01/2019   PLT 235 07/01/2019   Lab Results  Component Value Date   CREATININE 0.88 07/01/2019   BUN 26 (H) 07/01/2019   NA 136 07/01/2019   K 3.4 (L) 07/01/2019   CL 103 07/01/2019   CO2 23 07/01/2019    Anesthesia Physical Anesthesia Plan  ASA: III  Anesthesia Plan: General   Post-op Pain Management:    Induction: Intravenous  PONV Risk Score and Plan: 2 and Dexamethasone, Ondansetron and Treatment may vary due to age or medical condition  Airway Management Planned: Oral ETT  Additional Equipment: None  Intra-op Plan:   Post-operative Plan: Extubation in OR  Informed Consent: I have reviewed the patients History and Physical, chart, labs and discussed the procedure including the risks, benefits and alternatives for the proposed anesthesia with the patient or authorized representative who has indicated his/her understanding and acceptance.     Dental advisory given  Plan Discussed  with: CRNA  Anesthesia Plan Comments:         Anesthesia Quick Evaluation

## 2019-07-01 NOTE — Progress Notes (Signed)
Report given to [facility] in Short Stay- the patient is ready for the OR

## 2019-07-01 NOTE — Anesthesia Postprocedure Evaluation (Signed)
Anesthesia Post Note  Patient: Manuel Lewis  Procedure(s) Performed: ANTERIOR TOTAL HIP ARTHROPLASTY (Left Hip) Fine Needle Aspiration of left knee (Left Knee)     Patient location during evaluation: PACU Anesthesia Type: General Level of consciousness: awake and alert Pain management: pain level controlled Vital Signs Assessment: post-procedure vital signs reviewed and stable Respiratory status: spontaneous breathing, nonlabored ventilation, respiratory function stable and patient connected to nasal cannula oxygen Cardiovascular status: blood pressure returned to baseline and stable Postop Assessment: no apparent nausea or vomiting Anesthetic complications: no    Last Vitals:  Vitals:   07/01/19 1746 07/01/19 1956  BP: (!) 145/72 128/67  Pulse: 61 61  Resp: 17 17  Temp: 36.8 C 36.8 C  SpO2: 97% 97%    Last Pain:  Vitals:   07/01/19 1956  TempSrc: Oral  PainSc:                  [name]

## 2019-07-01 NOTE — Progress Notes (Signed)
Patient ID: [name], male   DOB: 1921-09-05, 86 y.o.   MRN: [id number] I have seen and examined Mr. Manuel Lewis.  He understands fully that we are proceeding to surgery today for addressing his acute left hip fracture.  This will be with a partial versus total hip arthroplasty.  The risk and benefits of surgery been explained to him as well as his son Dr. Harlow Lewis.  Informed consent is obtained.  The left hip has been marked.  His INR is lowered into an acceptable range for surgery.  We will resume Coumadin after surgery.

## 2019-07-01 NOTE — H&P (Signed)
History and Physical    Manuel Lewis [id number] DOB: [birth year] DOA: 06/30/2019  PCP: [name], MD   Patient coming from: Home.  Chief Complaint: Fall.  HPI: Manuel Lewis is a 86 y.o. male with history of A. fib, complete heart block status post pacemaker placement, hypertension who was walking in the parking lot along with his son and bumped on him and fell and hurt his left hip area.  Denies having hit his head.  Has been having some subconjunctival hemorrhage over the last few days after his Coumadin levels were elevated.  Denies chest pain or shortness of breath or any focal deficits.  ED Course: X-rays in the ER showed left hip fracture.  CT head and C-spine were done which shows chronic fracture of the nasal bone.  Orthopedic surgeon Dr. [name] was consulted and is planning to have surgery if INR is less than 2 in the morning.  Vitamin K 10 mg IV was given.  Patient takes Coumadin for A. fib.  Labs reveal mild hypokalemia hemoglobin 12 which is stable INR 3.  Covid test was negative.  EKG shows normal sinus rhythm with IVCD.  Review of Systems: As per HPI, rest all negative.   Past Medical History:  Diagnosis Date  . Atrial fibrillation ([place])   . Hearing loss   . Hypertension     Past Surgical History:  Procedure Laterality Date  . pace maker       reports that he has never smoked. He has never used smokeless tobacco. He reports previous alcohol use. He reports previous drug use.  No Known Allergies  Family History  Family history unknown: Yes    Prior to Admission medications   Medication Sig Start Date End Date Taking? Authorizing Provider  amLODipine (NORVASC) 5 MG tablet Take 5 mg by mouth daily. 03/31/19  Yes [provider]  hydrochlorothiazide (HYDRODIURIL) 25 MG tablet Take 25 mg by mouth daily.   Yes [provider]  telmisartan (MICARDIS) 80 MG tablet Take 80 mg by mouth daily. 06/23/19  Yes [provider]  warfarin  (COUMADIN) 2 MG tablet Take 2 mg by mouth See admin instructions. 1mg  daily, except on Mondays - take 2mg  as directed. 06/06/19  Yes [provider]    Physical Exam: Constitutional: Moderately built and nourished. Vitals:   06/30/19 1945 06/30/19 2000 06/30/19 2015 06/30/19 2255  BP: (!) 149/96 (!) 155/78 (!) 144/79 (!) 146/79  Pulse: 60 (!) 107 (!) 59 70  Resp: (!) 28 20 13    Temp:    98.3 F (36.8 C)  TempSrc:    Oral  SpO2: 98% 99% 99% 98%  Weight:      Height:       Eyes: Anicteric.  Subconjunctival hemorrhage of the left eye. ENMT: No discharge from the ears nose or mouth. Neck: No neck rigidity. Respiratory: No rhonchi or crepitations. Cardiovascular: S1-S2 heard. Abdomen: Soft nontender bowel sounds present. Musculoskeletal: Pain on moving left hip. Skin: Some skin with ecchymotic areas of the left hand. Neurologic: Alert awake oriented time place and person.  Moves all extremities. Psychiatric: Appears normal.   Labs on Admission: I have personally reviewed following labs and imaging studies  CBC: Recent Labs  Lab 06/30/19 1630  WBC 4.9  HGB 12.4*  HCT 36.6*  MCV 97.6  PLT 99991111   Basic Metabolic Panel: Recent Labs  Lab 06/30/19 1819  NA 138  K 3.4*  CL 104  CO2 23  GLUCOSE  106*  BUN 25*  CREATININE 0.92  CALCIUM 9.4   GFR: Estimated Creatinine Clearance: 44.7 mL/min (by C-G formula based on SCr of 0.92 mg/dL). Liver Function Tests: Recent Labs  Lab 06/30/19 1819  AST 29  ALT 22  ALKPHOS 44  BILITOT 1.5*  PROT 6.5  ALBUMIN 4.1   No results for input(s): LIPASE, AMYLASE in the last 168 hours. No results for input(s): AMMONIA in the last 168 hours. Coagulation Profile: Recent Labs  Lab 06/30/19 1819  INR 3.0*   Cardiac Enzymes: No results for input(s): CKTOTAL, CKMB, CKMBINDEX, TROPONINI in the last 168 hours. BNP (last 3 results) No results for input(s): PROBNP in the last 8760 hours. HbA1C: No results for input(s): HGBA1C  in the last 72 hours. CBG: No results for input(s): GLUCAP in the last 168 hours. Lipid Profile: No results for input(s): CHOL, HDL, LDLCALC, TRIG, CHOLHDL, LDLDIRECT in the last 72 hours. Thyroid Function Tests: No results for input(s): TSH, T4TOTAL, FREET4, T3FREE, THYROIDAB in the last 72 hours. Anemia Panel: No results for input(s): VITAMINB12, FOLATE, FERRITIN, TIBC, IRON, RETICCTPCT in the last 72 hours. Urine analysis:    Component Value Date/Time   COLORURINE YELLOW 06/30/2019 [place] 06/30/2019 1613   LABSPEC 1.012 06/30/2019 1613   PHURINE 7.0 06/30/2019 1613   GLUCOSEU NEGATIVE 06/30/2019 1613   HGBUR NEGATIVE 06/30/2019 1613   BILIRUBINUR NEGATIVE 06/30/2019 1613   KETONESUR NEGATIVE 06/30/2019 1613   PROTEINUR 100 (A) 06/30/2019 1613   NITRITE NEGATIVE 06/30/2019 1613   LEUKOCYTESUR NEGATIVE 06/30/2019 1613   Sepsis Labs: @LABRCNTIP (procalcitonin:4,lacticidven:4) ) Recent Results (from the past 240 hour(s))  SARS CORONAVIRUS 2 (TAT 6-24 HRS) Nasopharyngeal Nasopharyngeal Swab     Status: None   Collection Time: 06/30/19  5:46 PM   Specimen: Nasopharyngeal Swab  Result Value Ref Range Status   SARS Coronavirus 2 NEGATIVE NEGATIVE Final    Comment: (NOTE) SARS-CoV-2 target nucleic acids are NOT DETECTED. The SARS-CoV-2 RNA is generally detectable in upper and lower respiratory specimens during the acute phase of infection. Negative results do not preclude SARS-CoV-2 infection, do not rule out co-infections with other pathogens, and should not be used as the sole basis for treatment or other patient management decisions. Negative results must be combined with clinical observations, patient history, and epidemiological information. The expected result is Negative. Fact Sheet for Patients: SugarRoll.be Fact Sheet for Healthcare Providers: https://www.woods-mathews.com/ This test is not yet approved or  cleared by the [place] FDA and  has been authorized for detection and/or diagnosis of SARS-CoV-2 by FDA under an Emergency Use Authorization (EUA). This EUA will remain  in effect (meaning this test can be used) for the duration of the COVID-19 declaration under Section 56 4(b)(1) of the Act, 21 U.S.C. section 360bbb-3(b)(1), unless the authorization is terminated or revoked sooner. Performed at [facility], [place] [address], [place], [place] [address]   Surgical pcr screen     Status: None   Collection Time: 06/30/19  9:54 PM   Specimen: Nasal Mucosa; Nasal Swab  Result Value Ref Range Status   MRSA, PCR NEGATIVE NEGATIVE Final   Staphylococcus aureus NEGATIVE NEGATIVE Final    Comment: (NOTE) The Xpert SA Assay (FDA approved for NASAL specimens in patients 81 years of age and older), is one component of a comprehensive surveillance program. It is not intended to diagnose infection nor to guide or monitor treatment. Performed at [facility], [place] [address], [place], [place]  [address]      Radiological Exams on Admission: CT HEAD WO CONTRAST  Result Date: 06/30/2019 CLINICAL DATA:  Recent fall with headaches and neck pain, initial encounter EXAM: CT HEAD WITHOUT CONTRAST CT CERVICAL SPINE WITHOUT CONTRAST TECHNIQUE: Multidetector CT imaging of the head and cervical spine was performed following the standard protocol without intravenous contrast. Multiplanar CT image reconstructions of the cervical spine were also generated. COMPARISON:  None. FINDINGS: CT HEAD FINDINGS Brain: No evidence of acute infarction, hemorrhage, hydrocephalus, extra-axial collection or mass lesion/mass effect. Chronic atrophic and ischemic changes are noted. Vascular: No hyperdense vessel or unexpected calcification. Skull: Normal. Negative for fracture or focal lesion. Sinuses/Orbits: No acute finding. Other: Nasal bone fractures are seen but of uncertain chronicity. Correlation with the  physical exam is recommended. CT CERVICAL SPINE FINDINGS Alignment: Within normal limits. Skull base and vertebrae: 7 cervical segments are well visualized. Vertebral body height is well maintained. Multilevel disc space narrowing with osteophytic changes are seen. Multilevel facet hypertrophic changes are noted. No acute fracture or acute facet abnormality is noted. Soft tissues and spinal canal: Surrounding soft tissue structures show no acute abnormality. Vascular calcifications are seen. Scattered small hypodensities are noted within the thyroid measuring less than 1 cm. Upper chest: Visualized lung apices demonstrate emphysematous change. Other: None IMPRESSION: CT of the head: Chronic atrophic and ischemic changes without acute intracranial abnormality Bilateral nasal bone fractures of uncertain chronicity. Correlation with the physical exam is recommended. CT of the cervical spine: Multilevel degenerative change without acute bony abnormality. Scattered small hypodensities within the thyroid gland. Given the patient's age, no follow-up recommended unless clinically warranted (ref: J Am Coll Radiol. 2015 Feb;12(2): 143-50). Electronically Signed   By: Inez Catalina M.D.   On: 06/30/2019 18:20   CT CERVICAL SPINE WO CONTRAST  Result Date: 06/30/2019 CLINICAL DATA:  Recent fall with headaches and neck pain, initial encounter EXAM: CT HEAD WITHOUT CONTRAST CT CERVICAL SPINE WITHOUT CONTRAST TECHNIQUE: Multidetector CT imaging of the head and cervical spine was performed following the standard protocol without intravenous contrast. Multiplanar CT image reconstructions of the cervical spine were also generated. COMPARISON:  None. FINDINGS: CT HEAD FINDINGS Brain: No evidence of acute infarction, hemorrhage, hydrocephalus, extra-axial collection or mass lesion/mass effect. Chronic atrophic and ischemic changes are noted. Vascular: No hyperdense vessel or unexpected calcification. Skull: Normal. Negative for  fracture or focal lesion. Sinuses/Orbits: No acute finding. Other: Nasal bone fractures are seen but of uncertain chronicity. Correlation with the physical exam is recommended. CT CERVICAL SPINE FINDINGS Alignment: Within normal limits. Skull base and vertebrae: 7 cervical segments are well visualized. Vertebral body height is well maintained. Multilevel disc space narrowing with osteophytic changes are seen. Multilevel facet hypertrophic changes are noted. No acute fracture or acute facet abnormality is noted. Soft tissues and spinal canal: Surrounding soft tissue structures show no acute abnormality. Vascular calcifications are seen. Scattered small hypodensities are noted within the thyroid measuring less than 1 cm. Upper chest: Visualized lung apices demonstrate emphysematous change. Other: None IMPRESSION: CT of the head: Chronic atrophic and ischemic changes without acute intracranial abnormality Bilateral nasal bone fractures of uncertain chronicity. Correlation with the physical exam is recommended. CT of the cervical spine: Multilevel degenerative change without acute bony abnormality. Scattered small hypodensities within the thyroid gland. Given the patient's age, no follow-up recommended unless clinically warranted (ref: J Am Coll Radiol. 2015 Feb;12(2): 143-50). Electronically Signed   By: Inez Catalina M.D.   On: 06/30/2019 18:20   DG  Pelvis Portable  Result Date: 06/30/2019 CLINICAL DATA:  86 year old who fell earlier today. Patient is currently anticoagulated. EXAM: PORTABLE PELVIS 1-2 VIEWS COMPARISON:  Bone window images from CT abdomen and pelvis 05/19/2016. FINDINGS: Foreshortening of the LEFT femoral neck, either due to fracture or patient position. No fractures elsewhere. Hip joints anatomically aligned with well-preserved joint spaces for patient age. Sacroiliac joints and symphysis pubis anatomically aligned without diastasis. Degenerative changes involving the sacroiliac joints and the  visualized lower lumbar spine, unchanged since the prior CT. Brachytherapy seeds in the prostate gland. IMPRESSION: Foreshortening of the LEFT femoral neck, either due to fracture or patient position. Dedicated LEFT hip x-rays are recommended. Electronically Signed   By: Evangeline Dakin M.D.   On: 06/30/2019 16:22   DG Chest Port 1 View  Result Date: 06/30/2019 CLINICAL DATA:  86 year old who fell earlier today. Patient is currently anticoagulated. EXAM: PORTABLE CHEST 1 VIEW COMPARISON:  05/16/2016 and earlier. FINDINGS: Cardiac silhouette moderately to markedly enlarged for AP portable technique, increased in size since 2018. Thoracic aorta mildly atherosclerotic, unchanged. RIGHT subclavian single lead transvenous pacemaker, unchanged. Lungs clear. Bronchovascular markings normal. Pulmonary vascularity normal. No visible pleural effusions. No pneumothorax. IMPRESSION: 1. Moderate to marked cardiomegaly, increased in size since 2018. 2. No acute cardiopulmonary disease. Electronically Signed   By: Evangeline Dakin M.D.   On: 06/30/2019 16:19   DG Hip Unilat W or Wo Pelvis 2-3 Views Left  Result Date: 06/30/2019 CLINICAL DATA:  86 year old who fell and has LEFT hip pain. EXAM: DG HIP (WITH OR WITHOUT PELVIS) 2-3V LEFT COMPARISON:  No prior LEFT hip x-rays. Bone window images from CT abdomen and pelvis 05/19/2016. FINDINGS: Acute subcapital or basicervical LEFT femoral neck fracture. Hip joint anatomically aligned with well-preserved joint space. Bone mineral density well preserved for patient age. IMPRESSION: Acute subcapital or basicervical LEFT femoral neck fracture. Electronically Signed   By: Evangeline Dakin M.D.   On: 06/30/2019 16:49    EKG: Independently reviewed.  Normal sinus rhythm IVCD.  Assessment/Plan Principal Problem:   Closed left hip fracture, initial encounter [facility]) Active Problems:   Atrial fibrillation (HCC)   Pacemaker   Essential hypertension   Normocytic normochromic  anemia   Hip fracture (HCC)    1. Left hip fracture status post mechanical fall for which Dr. [name] orthopedic surgeon has been consulted.  Plan is to do surgery if INR is less than 2 in the morning.  Vitamin K was given for reverse anticoagulation.  Pain relief medications n.p.o. past midnight.  Discussed with Dr. [name]. 2. History of A. fib on anticoagulation presently anticoagulation on hold and INR has been reversed in anticipation of surgery.  Vitamin K 10 mg IV was given. 3. Hypertension presently n.p.o. so we have kept patient on as needed IV hydralazine.  Resume home medication once after surgery. 4. Anemia appears to be chronic. 5. History of complete heart block status post pacemaker placement. 6. Subconjunctival hemorrhage of left eye which has been present even before the fall. 7. Nasal bone fracture appears to be chronic. 8. Mild hypokalemia replace recheck check magnesium levels.  Could be from diuretic.  Since patient has fractures of the head will need surgery and more than 2 midnight stay in inpatient status.   DVT prophylaxis: SCDs for now.  Start anticoagulation per orthopedics when okay. Code Status: Full code confirmed with patient's son. Family Communication: Patient's son. Disposition Plan: May need rehab. Consults called: Orthopedic surgeon. Admission status: Inpatient.   Doreatha Lew  Hal Hope MD [facility] Pager [phone].  If 7PM-7AM, please contact night-coverage [email or web address] Password [email or web address]  07/01/2019, 12:20 AM

## 2019-07-01 NOTE — TOC Initial Note (Addendum)
Transition of Care [facility]) - Initial/Assessment Note    Patient Details  Name: Manuel Lewis MRN: [id number] Date of Birth: 08/21/1921  Transition of [facility]) CM/SW Contact:    Sharin Mons, RN Phone Number: [phone] 07/01/2019, 12:14 PM  Clinical Narrative:        Admitted with Closed left hip fracture s/p fall. Plan: left hip hemiarthroplasty versus a total hip replacement today, 07/01/2019.Hx of A. fib, complete heart block status post pacemaker placement, hypertension. From home alone . PTA independent with ADL's , no SME usage.  Gustabo Bech (Son)     [phone]       [facility] team following for TOC needs.       Patient Goals and CMS Choice        Expected Discharge Plan and Services        Prior Living Arrangements/Services     Activities of Daily Living Home Assistive Devices/Equipment: None ADL Screening (condition at time of admission) Patient's cognitive ability adequate to safely complete daily activities?: Yes Is the patient deaf or have difficulty hearing?: Yes Does the patient have difficulty seeing, even when wearing glasses/contacts?: Yes Does the patient have difficulty concentrating, remembering, or making decisions?: Yes Patient able to express need for assistance with ADLs?: Yes Does the patient have difficulty dressing or bathing?: No Independently performs ADLs?: Yes (appropriate for developmental age) Does the patient have difficulty walking or climbing stairs?: No Weakness of Legs: None Weakness of Arms/Hands: None  Permission Sought/Granted                  Emotional Assessment              Admission diagnosis:  Trauma left hip [S79.912A] Fall, initial encounter [W19.XXXA] Closed left hip fracture, initial encounter ([place]) [S72.002A] Hip fracture ([place]) [S72.009A] Patient Active Problem List   Diagnosis Date Noted  . Atrial fibrillation ([place]) 07/01/2019  . Pacemaker 07/01/2019  . Essential hypertension 07/01/2019   . Normocytic normochromic anemia 07/01/2019  . Hip fracture ([place]) 07/01/2019  . Closed left hip fracture, initial encounter ([place]) 06/30/2019   PCP:  [name], MD Pharmacy:   [id number] - [place], [place] [address] Phone: [phone] Fax: [phone]     Social Determinants of Health (SDOH) Interventions    Readmission Risk Interventions No flowsheet data found.

## 2019-07-01 NOTE — Progress Notes (Signed)
PROGRESS NOTE  Manuel Lewis [id number] DOB: 09-08-1921 DOA: 06/30/2019 PCP: [name], MD  HPI/Recap of past 24 hours: HPI from Dr [name] is a 86 y.o. male with history of A. fib, complete heart block status post pacemaker placement, hypertension who was walking in the parking lot along with his son and bumped on him and fell and hurt his left hip area.  Denies having hit his head.  Has been having some subconjunctival hemorrhage over the last few days after his Coumadin levels were elevated. In the ED, X-ray showed left hip fracture.  CT head and C-spine were done which shows chronic fracture of the nasal bone.  Orthopedic surgeon Dr. [name] was consulted. Vitamin K 10 mg IV was given as INR elevated to 3. Covid test was negative.  EKG shows normal sinus rhythm with IVCD. Pt admitted for further management.    Today, patient complained of left hip pain, otherwise denies any chest pain, abdominal pain, shortness of breath, nausea/vomiting, fever/chills.  Patient is alert, oriented, hard of hearing.  Plan for surgery today.    Assessment/Plan: Principal Problem:   Closed left hip fracture, initial encounter [facility]) Active Problems:   Atrial fibrillation [facility])   Pacemaker   Essential hypertension   Normocytic normochromic anemia   Hip fracture (HCC)   Left hip fracture status post mechanical fall Orthopedic surgeon Dr. [name] on board CT head/neck showed chronic nasal bone fracture, nothing acute Management by orthopedics PT/OT once able  History of chronic A. Fib Heart rate controlled Coumadin on hold, further management by pharmacy once restarted by Ortho   Subconjunctival hemorrhage of left eye Present before the fall Monitor closely  Hypokalemia Replace as needed  Hypertension Hydralazine IV as needed Hold home BP meds, HCTZ, amlodipine, telmisartan until once able  History of complete heart block Status post pacemaker placement       Malnutrition Type:      Malnutrition Characteristics:      Nutrition Interventions:       Estimated body mass index is 21.4 kg/m as calculated from the following:   Height as of this encounter: 5' 9.02" (1.753 m).   Weight as of this encounter: 65.8 kg.     Code Status: Full  Family Communication: Discussed extensively with patient  Disposition Plan: Patient lives alone at home, disposition pending PT/OT evaluation, likely SNF, disposition time pending orthopedic signing off   Consultants:  Orthopedics  Procedures:  None  Antimicrobials:  None  DVT prophylaxis: SCD-Coumadin on hold   Objective: Vitals:   07/01/19 0420 07/01/19 0842 07/01/19 1331 07/01/19 1403  BP: 133/72 (!) 144/82 (!) 144/74   Pulse: (!) 59 64 66   Resp: 16 17 17    Temp: 99.2 F (37.3 C) 98.9 F (37.2 C) 99.6 F (37.6 C)   TempSrc: Oral Oral Oral   SpO2: 96% 97% 94%   Weight:    65.8 kg  Height:    5' 9.02" (1.753 m)    Intake/Output Summary (Last 24 hours) at 07/01/2019 1515 Last data filed at 07/01/2019 0556 Gross per 24 hour  Intake --  Output 425 ml  Net -425 ml   Filed Weights   06/30/19 1604 07/01/19 1403  Weight: 65.8 kg 65.8 kg    Exam:  General: NAD, subconjunctival hemorrhage of the left eye, alert, awake, oriented x4, not of hearing  Cardiovascular: S1, S2 present  Respiratory: CTAB  Abdomen: Soft, nontender, nondistended, bowel sounds present  Musculoskeletal: No bilateral  pedal edema noted, pain with movement of left hip  Skin: Normal  Psychiatry: Normal mood   Data Reviewed: CBC: Recent Labs  Lab 06/30/19 1630 06/30/19 1645 07/01/19 0744  WBC 4.9  --  8.8  HGB 12.4* 12.2* 12.3*  HCT 36.6* 36.0* 36.1*  MCV 97.6  --  96.3  PLT 302  --  [id number]   Basic Metabolic Panel: Recent Labs  Lab 06/30/19 1645 06/30/19 1819 07/01/19 0744  NA 132* 138 136  K 6.5* 3.4* 3.4*  CL 102 104 103  CO2  --  23 23  GLUCOSE 100* 106* 124*  BUN 42*  25* 26*  CREATININE 0.90 0.92 0.88  CALCIUM  --  9.4 9.0  MG  --   --  1.8   GFR: Estimated Creatinine Clearance: 46.7 mL/min (by C-G formula based on SCr of 0.88 mg/dL). Liver Function Tests: Recent Labs  Lab 06/30/19 1819  AST 29  ALT 22  ALKPHOS 44  BILITOT 1.5*  PROT 6.5  ALBUMIN 4.1   No results for input(s): LIPASE, AMYLASE in the last 168 hours. No results for input(s): AMMONIA in the last 168 hours. Coagulation Profile: Recent Labs  Lab 06/30/19 1819 07/01/19 0456 07/01/19 1110  INR 3.0* 1.7* 1.4*   Cardiac Enzymes: No results for input(s): CKTOTAL, CKMB, CKMBINDEX, TROPONINI in the last 168 hours. BNP (last 3 results) No results for input(s): PROBNP in the last 8760 hours. HbA1C: No results for input(s): HGBA1C in the last 72 hours. CBG: No results for input(s): GLUCAP in the last 168 hours. Lipid Profile: No results for input(s): CHOL, HDL, LDLCALC, TRIG, CHOLHDL, LDLDIRECT in the last 72 hours. Thyroid Function Tests: No results for input(s): TSH, T4TOTAL, FREET4, T3FREE, THYROIDAB in the last 72 hours. Anemia Panel: No results for input(s): VITAMINB12, FOLATE, FERRITIN, TIBC, IRON, RETICCTPCT in the last 72 hours. Urine analysis:    Component Value Date/Time   COLORURINE YELLOW 06/30/2019 [place] 06/30/2019 1613   LABSPEC 1.012 06/30/2019 1613   PHURINE 7.0 06/30/2019 1613   GLUCOSEU NEGATIVE 06/30/2019 1613   HGBUR NEGATIVE 06/30/2019 1613   BILIRUBINUR NEGATIVE 06/30/2019 1613   KETONESUR NEGATIVE 06/30/2019 1613   PROTEINUR 100 (A) 06/30/2019 1613   NITRITE NEGATIVE 06/30/2019 1613   LEUKOCYTESUR NEGATIVE 06/30/2019 1613   Sepsis Labs: @LABRCNTIP (procalcitonin:4,lacticidven:4)  ) Recent Results (from the past 240 hour(s))  SARS CORONAVIRUS 2 (TAT 6-24 HRS) Nasopharyngeal Nasopharyngeal Swab     Status: None   Collection Time: 06/30/19  5:46 PM   Specimen: Nasopharyngeal Swab  Result Value Ref Range Status   SARS  Coronavirus 2 NEGATIVE NEGATIVE Final    Comment: (NOTE) SARS-CoV-2 target nucleic acids are NOT DETECTED. The SARS-CoV-2 RNA is generally detectable in upper and lower respiratory specimens during the acute phase of infection. Negative results do not preclude SARS-CoV-2 infection, do not rule out co-infections with other pathogens, and should not be used as the sole basis for treatment or other patient management decisions. Negative results must be combined with clinical observations, patient history, and epidemiological information. The expected result is Negative. Fact Sheet for Patients: SugarRoll.be Fact Sheet for Healthcare Providers: https://www.woods-mathews.com/ This test is not yet approved or cleared by the [place] FDA and  has been authorized for detection and/or diagnosis of SARS-CoV-2 by FDA under an Emergency Use Authorization (EUA). This EUA will remain  in effect (meaning this test can be used) for the duration of the COVID-19 declaration under Section 56 4(b)(1) of the  Act, 21 U.S.C. section 360bbb-3(b)(1), unless the authorization is terminated or revoked sooner. Performed at [facility], [place] [address] [place], [place] [address]   Surgical pcr screen     Status: None   Collection Time: 06/30/19  9:54 PM   Specimen: Nasal Mucosa; Nasal Swab  Result Value Ref Range Status   MRSA, PCR NEGATIVE NEGATIVE Final   Staphylococcus aureus NEGATIVE NEGATIVE Final    Comment: (NOTE) The Xpert SA Assay (FDA approved for NASAL specimens in patients 106 years of age and older), is one component of a comprehensive surveillance program. It is not intended to diagnose infection nor to guide or monitor treatment. Performed at [facility], [place] [address] [place], [place] [address]       Studies: CT HEAD WO CONTRAST  Result Date: 06/30/2019 CLINICAL DATA:  Recent fall with headaches and neck pain, initial  encounter EXAM: CT HEAD WITHOUT CONTRAST CT CERVICAL SPINE WITHOUT CONTRAST TECHNIQUE: Multidetector CT imaging of the head and cervical spine was performed following the standard protocol without intravenous contrast. Multiplanar CT image reconstructions of the cervical spine were also generated. COMPARISON:  None. FINDINGS: CT HEAD FINDINGS Brain: No evidence of acute infarction, hemorrhage, hydrocephalus, extra-axial collection or mass lesion/mass effect. Chronic atrophic and ischemic changes are noted. Vascular: No hyperdense vessel or unexpected calcification. Skull: Normal. Negative for fracture or focal lesion. Sinuses/Orbits: No acute finding. Other: Nasal bone fractures are seen but of uncertain chronicity. Correlation with the physical exam is recommended. CT CERVICAL SPINE FINDINGS Alignment: Within normal limits. Skull base and vertebrae: 7 cervical segments are well visualized. Vertebral body height is well maintained. Multilevel disc space narrowing with osteophytic changes are seen. Multilevel facet hypertrophic changes are noted. No acute fracture or acute facet abnormality is noted. Soft tissues and spinal canal: Surrounding soft tissue structures show no acute abnormality. Vascular calcifications are seen. Scattered small hypodensities are noted within the thyroid measuring less than 1 cm. Upper chest: Visualized lung apices demonstrate emphysematous change. Other: None IMPRESSION: CT of the head: Chronic atrophic and ischemic changes without acute intracranial abnormality Bilateral nasal bone fractures of uncertain chronicity. Correlation with the physical exam is recommended. CT of the cervical spine: Multilevel degenerative change without acute bony abnormality. Scattered small hypodensities within the thyroid gland. Given the patient's age, no follow-up recommended unless clinically warranted (ref: J Am Coll Radiol. 2015 Feb;12(2): 143-50). Electronically Signed   By: Inez Catalina M.D.   On:  06/30/2019 18:20   CT CERVICAL SPINE WO CONTRAST  Result Date: 06/30/2019 CLINICAL DATA:  Recent fall with headaches and neck pain, initial encounter EXAM: CT HEAD WITHOUT CONTRAST CT CERVICAL SPINE WITHOUT CONTRAST TECHNIQUE: Multidetector CT imaging of the head and cervical spine was performed following the standard protocol without intravenous contrast. Multiplanar CT image reconstructions of the cervical spine were also generated. COMPARISON:  None. FINDINGS: CT HEAD FINDINGS Brain: No evidence of acute infarction, hemorrhage, hydrocephalus, extra-axial collection or mass lesion/mass effect. Chronic atrophic and ischemic changes are noted. Vascular: No hyperdense vessel or unexpected calcification. Skull: Normal. Negative for fracture or focal lesion. Sinuses/Orbits: No acute finding. Other: Nasal bone fractures are seen but of uncertain chronicity. Correlation with the physical exam is recommended. CT CERVICAL SPINE FINDINGS Alignment: Within normal limits. Skull base and vertebrae: 7 cervical segments are well visualized. Vertebral body height is well maintained. Multilevel disc space narrowing with osteophytic changes are seen. Multilevel facet hypertrophic changes are noted. No acute fracture or acute facet  abnormality is noted. Soft tissues and spinal canal: Surrounding soft tissue structures show no acute abnormality. Vascular calcifications are seen. Scattered small hypodensities are noted within the thyroid measuring less than 1 cm. Upper chest: Visualized lung apices demonstrate emphysematous change. Other: None IMPRESSION: CT of the head: Chronic atrophic and ischemic changes without acute intracranial abnormality Bilateral nasal bone fractures of uncertain chronicity. Correlation with the physical exam is recommended. CT of the cervical spine: Multilevel degenerative change without acute bony abnormality. Scattered small hypodensities within the thyroid gland. Given the patient's age, no follow-up  recommended unless clinically warranted (ref: J Am Coll Radiol. 2015 Feb;12(2): 143-50). Electronically Signed   By: Inez Catalina M.D.   On: 06/30/2019 18:20   DG Pelvis Portable  Result Date: 06/30/2019 CLINICAL DATA:  86 year old who fell earlier today. Patient is currently anticoagulated. EXAM: PORTABLE PELVIS 1-2 VIEWS COMPARISON:  Bone window images from CT abdomen and pelvis 05/19/2016. FINDINGS: Foreshortening of the LEFT femoral neck, either due to fracture or patient position. No fractures elsewhere. Hip joints anatomically aligned with well-preserved joint spaces for patient age. Sacroiliac joints and symphysis pubis anatomically aligned without diastasis. Degenerative changes involving the sacroiliac joints and the visualized lower lumbar spine, unchanged since the prior CT. Brachytherapy seeds in the prostate gland. IMPRESSION: Foreshortening of the LEFT femoral neck, either due to fracture or patient position. Dedicated LEFT hip x-rays are recommended. Electronically Signed   By: Evangeline Dakin M.D.   On: 06/30/2019 16:22   DG Chest Port 1 View  Result Date: 06/30/2019 CLINICAL DATA:  86 year old who fell earlier today. Patient is currently anticoagulated. EXAM: PORTABLE CHEST 1 VIEW COMPARISON:  05/16/2016 and earlier. FINDINGS: Cardiac silhouette moderately to markedly enlarged for AP portable technique, increased in size since 2018. Thoracic aorta mildly atherosclerotic, unchanged. RIGHT subclavian single lead transvenous pacemaker, unchanged. Lungs clear. Bronchovascular markings normal. Pulmonary vascularity normal. No visible pleural effusions. No pneumothorax. IMPRESSION: 1. Moderate to marked cardiomegaly, increased in size since 2018. 2. No acute cardiopulmonary disease. Electronically Signed   By: Evangeline Dakin M.D.   On: 06/30/2019 16:19   DG Hip Unilat W or Wo Pelvis 2-3 Views Left  Result Date: 06/30/2019 CLINICAL DATA:  86 year old who fell and has LEFT hip pain. EXAM: DG  HIP (WITH OR WITHOUT PELVIS) 2-3V LEFT COMPARISON:  No prior LEFT hip x-rays. Bone window images from CT abdomen and pelvis 05/19/2016. FINDINGS: Acute subcapital or basicervical LEFT femoral neck fracture. Hip joint anatomically aligned with well-preserved joint space. Bone mineral density well preserved for patient age. IMPRESSION: Acute subcapital or basicervical LEFT femoral neck fracture. Electronically Signed   By: Evangeline Dakin M.D.   On: 06/30/2019 16:49    Scheduled Meds:  Continuous Infusions: . ceFAZolin    .  ceFAZolin (ANCEF) IV    . lactated ringers 10 mL/hr at 07/01/19 1407     LOS: 1 day     Alma Friendly, MD [facility]  If 7PM-7AM, please contact night-coverage [email or web address] 07/01/2019, 3:15 PM

## 2019-07-01 NOTE — Progress Notes (Signed)
Initial Nutrition Assessment  DOCUMENTATION CODES:   Not applicable  INTERVENTION:   -MVI with minerals daily -Ensure Enlive po daily, each supplement provides 350 kcal and 20 grams of protein  NUTRITION DIAGNOSIS:   Increased nutrient needs related to post-op healing as evidenced by estimated needs.  GOAL:   Patient will meet greater than or equal to 90% of their needs  MONITOR:   PO intake, Supplement acceptance, Diet advancement, Labs, Weight trends, Skin, I & O's  REASON FOR ASSESSMENT:   Consult Hip fracture protocol  ASSESSMENT:   Manuel Lewis is a 86 y.o. male with history of A. fib, complete heart block status post pacemaker placement, hypertension who was walking in the parking lot along with his son and bumped on him and fell and hurt his left hip area.  Denies having hit his head.  Has been having some subconjunctival hemorrhage over the last few days after his Coumadin levels were elevated.  Denies chest pain or shortness of breath or any focal deficits.  Pt admitted with lt hip fracture s/p mechanical fall.   Reviewed I/O's: -435 ml x 24 hours  UOP: 425 ml x 24 hours  Per orthopedics notes, plan for hemi vs total hip replacement today.  Spoke with pt at bedside, who was pleasant and in good spirits today. He reports he is anxious for surgery and is concerned that his surgery and immobility will cause constipation. Pt explains that he has regular bowel movements, however, uses stool softeners and eats prunes daily to assist with this (last BM 06/30/19 AM).   PTA pt reports good appetite. He consumes 3 meals per day (Breakfast: eggs and toast or eggs and bacon and coffee; Lunch: sandwich; Dinner: soup or "a nice green salad"). Pt shares that his son always stops by to provide him dinner and feeds him large portions.   Pt denies any weight loss. He reports UBW is around 145#.   Pt denies any further nutrition-related concerns, but expressed appreciation for  visit. Discussed importance of good meal intake to support healing.   Labs reviewed: K: 3.4.   NUTRITION - FOCUSED PHYSICAL EXAM:    Most Recent Value  Orbital Region  No depletion  Upper Arm Region  Mild depletion  Thoracic and Lumbar Region  No depletion  Buccal Region  No depletion  Temple Region  Mild depletion  Clavicle Bone Region  No depletion  Clavicle and Acromion Bone Region  No depletion  Scapular Bone Region  No depletion  Dorsal Hand  No depletion  Patellar Region  No depletion  Anterior Thigh Region  No depletion  Posterior Calf Region  No depletion  Edema (RD Assessment)  None  Hair  Reviewed  Eyes  Reviewed  Mouth  Reviewed  Skin  Reviewed  Nails  Reviewed       Diet Order:   Diet Order            Diet NPO time specified  Diet effective now              EDUCATION NEEDS:   Education needs have been addressed  Skin:  Skin Assessment: Reviewed RN Assessment  Last BM:  06/30/19  Height:   Ht Readings from Last 1 Encounters:  07/01/19 5' 9.02" (1.753 m)    Weight:   Wt Readings from Last 1 Encounters:  07/01/19 65.8 kg    Ideal Body Weight:  72.7 kg  BMI:  Body mass index is 21.4 kg/m.  Estimated  Nutritional Needs:   Kcal:  1650-1850  Protein:  80-95 grams  Fluid:  > 1.6 L    Loistine Chance, RD, LDN, [place] Registered Dietitian II Certified Diabetes Care and Education Specialist Please refer to [facility] for RD and/or RD on-call/weekend/after hours pager

## 2019-07-01 NOTE — Plan of Care (Signed)

## 2019-07-01 NOTE — Plan of Care (Signed)

## 2019-07-01 NOTE — Progress Notes (Signed)
ANTICOAGULATION CONSULT NOTE - Initial Consult  Pharmacy Consult for warfarin Indication: atrial fibrillation  No Known Allergies  Patient Measurements: Height: 5' 9.02" (175.3 cm) Weight: 65.8 kg (145 lb) IBW/kg (Calculated) : 70.74  Vital Signs: Temp: 98.2 F (36.8 C) (04/16 1746) Temp Source: Oral (04/16 1746) BP: 145/72 (04/16 1746) Pulse Rate: 61 (04/16 1746)  Labs: Recent Labs    06/30/19 1630 06/30/19 1630 06/30/19 1645 06/30/19 1819 07/01/19 0456 07/01/19 0744 07/01/19 1110  HGB 12.4*   < > 12.2*  --   --  12.3*  --   HCT 36.6*  --  36.0*  --   --  36.1*  --   PLT 302  --   --   --   --  235  --   LABPROT  --   --   --  31.0* 19.5*  --  16.9*  INR  --   --   --  3.0* 1.7*  --  1.4*  CREATININE  --   --  0.90 0.92  --  0.88  --    < > = values in this interval not displayed.    Estimated Creatinine Clearance: 46.7 mL/min (by C-G formula based on SCr of 0.88 mg/dL).   Medical History: Past Medical History:  Diagnosis Date  . Atrial fibrillation ([place])   . Hearing loss   . Hypertension     Medications:  Medications Prior to Admission  Medication Sig Dispense Refill Last Dose  . amLODipine (NORVASC) 5 MG tablet Take 5 mg by mouth daily.   06/30/2019 at Unknown time  . hydrochlorothiazide (HYDRODIURIL) 25 MG tablet Take 25 mg by mouth daily.   06/30/2019 at Unknown time  . telmisartan (MICARDIS) 80 MG tablet Take 80 mg by mouth daily.   06/30/2019 at Unknown time  . warfarin (COUMADIN) 2 MG tablet Take 2 mg by mouth See admin instructions. 1mg  daily, except on Mondays - take 2mg  as directed.   Past Week at Unknown time    Assessment: Pharmacy consulted to continue PTA warfarin following surgery for left hip fracture.  PTA warfarin regimen is 1 mg daily except 2 mg on Mondays.  Vitamin K 5 mg IV given on 07/01/19 and vitamin K 10 mg IV given on 06/30/19 preoperatively.  Today's INR < 2.  Noted to be hemodynamically stable in PACU and not indicated to delay start  of pharmacologic VTE agent due to surgical blood loss or risk of bleeding.    Goal of Therapy:  Goal INR: 2-3 Monitor platelets by anticoagulation protocol: Yes   Plan:  Warfarin 2mg  po x 1 today Monitor daily INR for warfarin dose, CBC, s/s bleeding  Efraim Kaufmann, PharmD, BCPS 07/01/2019,6:09 PM

## 2019-07-01 NOTE — Anesthesia Procedure Notes (Signed)
Procedure Name: Intubation Performed by: Milford Cage, CRNA Pre-anesthesia Checklist: Patient identified, Emergency Drugs available, Suction available and Patient being monitored Patient Re-evaluated:Patient Re-evaluated prior to induction Oxygen Delivery Method: Circle System Utilized Preoxygenation: Pre-oxygenation with 100% oxygen Induction Type: IV induction Ventilation: Mask ventilation without difficulty Laryngoscope Size: Mac and 3 Grade View: Grade I Tube type: Oral Tube size: 7.0 mm Number of attempts: 1 Airway Equipment and Method: Stylet Placement Confirmation: ETT inserted through vocal cords under direct vision,  positive ETCO2 and breath sounds checked- equal and bilateral Secured at: 23 cm Tube secured with: Tape Dental Injury: Teeth and Oropharynx as per pre-operative assessment

## 2019-07-02 DIAGNOSIS — Z950 Presence of cardiac pacemaker: Secondary | ICD-10-CM

## 2019-07-02 LAB — BASIC METABOLIC PANEL
Anion gap: 11 (ref 5–15)
BUN: 25 mg/dL — ABNORMAL HIGH (ref 8–23)
CO2: 22 mmol/L (ref 22–32)
Calcium: 8.6 mg/dL — ABNORMAL LOW (ref 8.9–10.3)
Chloride: 104 mmol/L (ref 98–111)
Creatinine, Ser: 1.19 mg/dL (ref 0.61–1.24)
GFR calc Af Amer: 60 mL/min — ABNORMAL LOW (ref >60)
GFR calc non Af Amer: 52 mL/min — ABNORMAL LOW (ref >60)
Glucose, Bld: 146 mg/dL — ABNORMAL HIGH (ref 70–99)
Potassium: 3.8 mmol/L (ref 3.5–5.1)
Sodium: 137 mmol/L (ref 135–145)

## 2019-07-02 LAB — CBC WITH DIFFERENTIAL/PLATELET
Abs Immature Granulocytes: 0.08 10*3/uL — ABNORMAL HIGH (ref 0.00–0.07)
Basophils Absolute: 0 10*3/uL (ref 0.0–0.1)
Basophils Relative: 0 %
Eosinophils Absolute: 0 10*3/uL (ref 0.0–0.5)
Eosinophils Relative: 0 %
HCT: 35.7 % — ABNORMAL LOW (ref 39.0–52.0)
Hemoglobin: 11.9 g/dL — ABNORMAL LOW (ref 13.0–17.0)
Immature Granulocytes: 1 %
Lymphocytes Relative: 4 %
Lymphs Abs: 0.4 10*3/uL — ABNORMAL LOW (ref 0.7–4.0)
MCH: 32.9 pg (ref 26.0–34.0)
MCHC: 33.3 g/dL (ref 30.0–36.0)
MCV: 98.6 fL (ref 80.0–100.0)
Monocytes Absolute: 1.2 10*3/uL — ABNORMAL HIGH (ref 0.1–1.0)
Monocytes Relative: 11 %
Neutro Abs: 8.6 10*3/uL — ABNORMAL HIGH (ref 1.7–7.7)
Neutrophils Relative %: 84 %
Platelets: 208 10*3/uL (ref 150–400)
RBC: 3.62 MIL/uL — ABNORMAL LOW (ref 4.22–5.81)
RDW: 13.2 % (ref 11.5–15.5)
WBC: 10.3 10*3/uL (ref 4.0–10.5)
nRBC: 0 % (ref 0.0–0.2)

## 2019-07-02 LAB — PROTIME-INR
INR: 1.4 — ABNORMAL HIGH (ref 0.8–1.2)
Prothrombin Time: 17.1 seconds — ABNORMAL HIGH (ref 11.4–15.2)

## 2019-07-02 MED ORDER — WARFARIN SODIUM 3 MG PO TABS
3.0000 mg | ORAL_TABLET | Freq: Once | ORAL | Status: AC
  Administered 2019-07-02: 3 mg via ORAL
  Filled 2019-07-02: qty 1

## 2019-07-02 MED ORDER — TAMSULOSIN HCL 0.4 MG PO CAPS
0.4000 mg | ORAL_CAPSULE | Freq: Every day | ORAL | Status: DC
  Administered 2019-07-02 – 2019-07-06 (×5): 0.4 mg via ORAL
  Filled 2019-07-02 (×5): qty 1

## 2019-07-02 MED ORDER — AMLODIPINE BESYLATE 5 MG PO TABS
5.0000 mg | ORAL_TABLET | Freq: Every day | ORAL | Status: DC
  Administered 2019-07-02 – 2019-07-06 (×4): 5 mg via ORAL
  Filled 2019-07-02 (×5): qty 1

## 2019-07-02 NOTE — Evaluation (Signed)
Physical Therapy Evaluation Patient Details Name: Manuel Lewis MRN: [id number] DOB: May 21, 1921 Today's Date: 07/02/2019   History of Present Illness  Manuel Lewis is a 86 y.o. male admitted with left hip fx s/p fall.  Underwent Anterior THR 07/01/19.  PMH significant for hx of A. fib, complete heart block status post pacemaker placement, hypertension  Clinical Impression  Patient presents with minimal dependencies in gait and mobility.  Patient was extremely independent prior to fall/fx and has good family support.  Feel patient will benefit from PT to progress mobility and transfers.  Anticipate steady gains.  Currently feel patient will be able to return home with assistance.  Family is working on sitters or assistance in the home.     Follow Up Recommendations Home health PT    Equipment Recommendations  Rolling walker with 5" wheels;3in1 (PT)    Recommendations for Other Services       Precautions / Restrictions Precautions Precautions: Fall Restrictions Weight Bearing Restrictions: Yes LLE Weight Bearing: Weight bearing as tolerated      Mobility  Bed Mobility Overal bed mobility: Needs Assistance Bed Mobility: Supine to Sit     Supine to sit: Mod assist        Transfers Overall transfer level: Needs assistance Equipment used: Rolling walker (2 wheeled) Transfers: Sit to/from Stand Sit to Stand: Min assist         General transfer comment: assist to power up and maintain balance  Ambulation/Gait Ambulation/Gait assistance: Min assist Gait Distance (Feet): 20 Feet Assistive device: Rolling walker (2 wheeled) Gait Pattern/deviations: Step-to pattern;Decreased stride length;Antalgic Gait velocity: decreased      Stairs            Wheelchair Mobility    Modified Rankin (Stroke Patients Only)       Balance Overall balance assessment: Needs assistance Sitting-balance support: No upper extremity supported;Feet supported Sitting  balance-Leahy Scale: Fair     Standing balance support: Bilateral upper extremity supported;During functional activity Standing balance-Leahy Scale: Poor Standing balance comment: reliant on RW for balance                             Pertinent Vitals/Pain Pain Assessment: 0-10 Pain Score: 3  Pain Descriptors / Indicators: Discomfort;Sore Pain Intervention(s): Limited activity within patient's tolerance;Monitored during session    [place] expects to be discharged to:: Private residence Living Arrangements: Alone Available Help at Discharge: Family;Available PRN/intermittently(family in town; looking at personal care attendants to provide support) Type of Home: House Home Access: Stairs to enter Entrance Stairs-Rails: Right Entrance Stairs-Number of Steps: 4 Home Layout: Two level;Able to live on main level with bedroom/bathroom Home Equipment: None      Prior Function Level of Independence: Independent         Comments: still driving     Hand Dominance        Extremity/Trunk Assessment        Lower Extremity Assessment Lower Extremity Assessment: Generalized weakness    Cervical / Trunk Assessment Cervical / Trunk Assessment: Kyphotic  Communication   Communication: HOH  Cognition Arousal/Alertness: Awake/alert Behavior During Therapy: WFL for tasks assessed/performed Overall Cognitive Status: Within Functional Limits for tasks assessed                                        General Comments  Exercises Total Joint Exercises Ankle Circles/Pumps: AROM;Both;10 reps;Seated Quad Sets: AROM;Both;10 reps;Seated   Assessment/Plan    PT Assessment Patient needs continued PT services  PT Problem List Decreased activity tolerance;Decreased balance;Decreased mobility;Decreased strength;Decreased knowledge of use of DME       PT Treatment Interventions DME instruction;Gait training;Stair training;Functional  mobility training;Therapeutic activities;Therapeutic exercise;Balance training;Patient/family education    PT Goals (Current goals can be found in the Care Plan section)  Acute Rehab PT Goals Patient Stated Goal: go home PT Goal Formulation: With patient/family Time For Goal Achievement: 07/09/19 Potential to Achieve Goals: Good    Frequency Min 5X/week   Barriers to discharge Decreased caregiver support lives alone; family considering sitters    Co-evaluation               AM-PAC PT "6 Clicks" Mobility  Outcome Measure Help needed turning from your back to your side while in a flat bed without using bedrails?: A Lot Help needed moving from lying on your back to sitting on the side of a flat bed without using bedrails?: A Lot Help needed moving to and from a bed to a chair (including a wheelchair)?: A Little Help needed standing up from a chair using your arms (e.g., wheelchair or bedside chair)?: A Little Help needed to walk in hospital room?: A Little Help needed climbing 3-5 steps with a railing? : A Lot 6 Click Score: 15    End of Session   Activity Tolerance: Patient tolerated treatment well;No increased pain Patient left: in chair;with call bell/phone within reach;with family/visitor present   PT Visit Diagnosis: History of falling (Z91.81);Unsteadiness on feet (R26.81)    Time: 1105-1140 PT Time Calculation (min) (ACUTE ONLY): 35 min   Charges:   PT Evaluation $PT Eval Moderate Complexity: 1 Mod PT Treatments $Gait Training: 8-22 mins        07/02/2019 Margie, PT Acute Rehabilitation Services Pager:  [phone] Office:  [place] 07/02/2019, 12:49 PM

## 2019-07-02 NOTE — Plan of Care (Signed)
  Problem: Pain Managment: Goal: General experience of comfort will improve Outcome: Progressing   Problem: Safety: Goal: Ability to remain free from injury will improve Outcome: Progressing   Problem: Skin Integrity: Goal: Risk for impaired skin integrity will decrease Outcome: Progressing   

## 2019-07-02 NOTE — Social Work (Signed)
CSW acknowledging consult for SNF placement. Will follow for therapy recommendations needed to best determine disposition/for insurance authorization.   Zephan Beauchaine, MSW, LCSW [facility] Clinical Social Work    

## 2019-07-02 NOTE — NC FL2 (Signed)
[place] LEVEL OF CARE SCREENING TOOL     IDENTIFICATION  Patient Name: Manuel Lewis Birthdate: 06-04-1921 Sex: male Admission Date (Current Location): 06/30/2019  [facility] and [place] Number:  Herbalist and Address:  [facility], [place] [address] [place], [place] [address]      Provider Number: [id number]  Attending Physician Name and Address:  Alma Friendly, MD  Relative Name and Phone Number:       Current Level of Care: Hospital Recommended Level of Care: [place] Prior Approval Number:    Date Approved/Denied:   PASRR Number: [id number]  Discharge Plan: SNF    Current Diagnoses: Patient Active Problem List   Diagnosis Date Noted  . Atrial fibrillation ([place]) 07/01/2019  . Pacemaker 07/01/2019  . Essential hypertension 07/01/2019  . Normocytic normochromic anemia 07/01/2019  . Hip fracture ([place]) 07/01/2019  . Closed left hip fracture, initial encounter ([place]) 06/30/2019    Orientation RESPIRATION BLADDER Height & Weight     Self, Time, Place, Situation  Normal Continent Weight: 145 lb (65.8 kg) Height:  5' 9.02" (175.3 cm)  BEHAVIORAL SYMPTOMS/MOOD NEUROLOGICAL BOWEL NUTRITION STATUS      Continent Diet(see discharge summary)  AMBULATORY STATUS COMMUNICATION OF NEEDS Skin   Extensive Assist Verbally Surgical wounds, Skin abrasions, Other (Comment)(closed incision on left hip with silver hydrofiber; abrasion on left knee; generalized ecchymosis)                       Personal Care Assistance Level of Assistance  Bathing, Feeding, Dressing Bathing Assistance: Maximum assistance Feeding assistance: Independent Dressing Assistance: Maximum assistance     Functional Limitations Info  Sight, Hearing, Speech Sight Info: Adequate Hearing Info: Impaired Speech Info: Adequate    SPECIAL CARE FACTORS FREQUENCY  PT (By licensed PT), OT (By licensed OT)     PT Frequency: 5x week OT  Frequency: 5x week            Contractures Contractures Info: Not present    Additional Factors Info  Code Status, Allergies Code Status Info: Full Code Allergies Info: No Known Allergies           Current Medications (07/02/2019):  This is the current hospital active medication list Current Facility-Administered Medications  Medication Dose Route Frequency Provider Last Rate Last Admin  . 0.9 %  sodium chloride infusion   Intravenous Continuous Mcarthur Rossetti, MD 75 mL/hr at 07/01/19 1824 New Bag at 07/01/19 1824  . acetaminophen (TYLENOL) tablet 325-650 mg  325-650 mg Oral Q6H PRN Mcarthur Rossetti, MD      . alum & mag hydroxide-simeth (MAALOX/MYLANTA) 200-200-20 MG/5ML suspension 30 mL  30 mL Oral Q4H PRN Mcarthur Rossetti, MD      . diphenhydrAMINE (BENADRYL) 12.5 MG/5ML elixir 12.5-25 mg  12.5-25 mg Oral Q4H PRN Mcarthur Rossetti, MD      . docusate sodium (COLACE) capsule 100 mg  100 mg Oral BID Mcarthur Rossetti, MD   100 mg at 07/02/19 1006  . feeding supplement (ENSURE ENLIVE) (ENSURE ENLIVE) liquid 237 mL  237 mL Oral Q24H Alma Friendly, MD      . hydrALAZINE (APRESOLINE) injection 10 mg  10 mg Intravenous Q4H PRN Mcarthur Rossetti, MD      . HYDROcodone-acetaminophen [facility]) 7.5-325 MG per tablet 1-2 tablet  1-2 tablet Oral Q4H PRN Mcarthur Rossetti, MD      . HYDROcodone-acetaminophen (NORCO/VICODIN)  5-325 MG per tablet 1-2 tablet  1-2 tablet Oral Q4H PRN Mcarthur Rossetti, MD      . menthol-cetylpyridinium (CEPACOL) lozenge 3 mg  1 lozenge Oral PRN Mcarthur Rossetti, MD       Or  . phenol (CHLORASEPTIC) mouth spray 1 spray  1 spray Mouth/Throat PRN Mcarthur Rossetti, MD      . methocarbamol (ROBAXIN) tablet 500 mg  500 mg Oral Q6H PRN Mcarthur Rossetti, MD       Or  . methocarbamol (ROBAXIN) 500 mg in dextrose 5 % 50 mL IVPB  500 mg Intravenous Q6H PRN Mcarthur Rossetti, MD      .  metoCLOPramide (REGLAN) tablet 5-10 mg  5-10 mg Oral Q8H PRN Mcarthur Rossetti, MD       Or  . metoCLOPramide (REGLAN) injection 5-10 mg  5-10 mg Intravenous Q8H PRN Mcarthur Rossetti, MD      . morphine 2 MG/ML injection 0.5-1 mg  0.5-1 mg Intravenous Q2H PRN Mcarthur Rossetti, MD      . multivitamin with minerals tablet 1 tablet  1 tablet Oral Daily Alma Friendly, MD   1 tablet at 07/02/19 1006  . ondansetron (ZOFRAN) tablet 4 mg  4 mg Oral Q6H PRN Mcarthur Rossetti, MD       Or  . ondansetron [facility]) injection 4 mg  4 mg Intravenous Q6H PRN Mcarthur Rossetti, MD      . pantoprazole (PROTONIX) EC tablet 40 mg  40 mg Oral Daily Mcarthur Rossetti, MD   40 mg at 07/02/19 1006  . warfarin (COUMADIN) tablet 3 mg  3 mg Oral ONCE-1600 Alma Friendly, MD      . Warfarin - Pharmacist Dosing Inpatient   Does not apply q1600 Efraim Kaufmann, [facility]         Discharge Medications: Please see discharge summary for a list of discharge medications.  Relevant Imaging Results:  Relevant Lab Results:   Additional Information SS#[id number] [place]

## 2019-07-02 NOTE — Plan of Care (Signed)

## 2019-07-02 NOTE — Progress Notes (Signed)
ANTICOAGULATION CONSULT NOTE - Initial Consult  Pharmacy Consult for warfarin Indication: atrial fibrillation  No Known Allergies  Patient Measurements: Height: 5' 9.02" (175.3 cm) Weight: 65.8 kg (145 lb) IBW/kg (Calculated) : 70.74  Vital Signs: Temp: 99 F (37.2 C) (04/17 0401) Temp Source: Oral (04/17 0401) BP: 134/71 (04/17 0401) Pulse Rate: 64 (04/17 0401)  Labs: Recent Labs    06/30/19 1630 06/30/19 1630 06/30/19 1645 06/30/19 1645 06/30/19 1819 06/30/19 1819 07/01/19 0456 07/01/19 0744 07/01/19 1110 07/02/19 0501  HGB 12.4*   < > 12.2*   < >  --   --   --  12.3*  --  11.9*  HCT 36.6*   < > 36.0*  --   --   --   --  36.1*  --  35.7*  PLT 302  --   --   --   --   --   --  235  --  208  LABPROT  --   --   --   --  31.0*   < > 19.5*  --  16.9* 17.1*  INR  --   --   --   --  3.0*   < > 1.7*  --  1.4* 1.4*  CREATININE  --   --  0.90   < > 0.92  --   --  0.88  --  1.19   < > = values in this interval not displayed.    Estimated Creatinine Clearance: 34.6 mL/min (by C-G formula based on SCr of 1.19 mg/dL).   Medical History: Past Medical History:  Diagnosis Date  . Atrial fibrillation ([place])   . Hearing loss   . Hypertension     Medications:  Medications Prior to Admission  Medication Sig Dispense Refill Last Dose  . amLODipine (NORVASC) 5 MG tablet Take 5 mg by mouth daily.   06/30/2019 at Unknown time  . hydrochlorothiazide (HYDRODIURIL) 25 MG tablet Take 25 mg by mouth daily.   06/30/2019 at Unknown time  . telmisartan (MICARDIS) 80 MG tablet Take 80 mg by mouth daily.   06/30/2019 at Unknown time  . warfarin (COUMADIN) 2 MG tablet Take 2 mg by mouth See admin instructions. 1mg  daily, except on Mondays - take 2mg  as directed.   Past Week at Unknown time    Assessment: Pharmacy consulted to continue PTA warfarin following surgery for left hip fracture.  PTA warfarin regimen is 1 mg daily except 2 mg on Mondays. Noted to be hemodynamically stable in PACU and  not indicated to delay start of pharmacologic VTE agent due to surgical blood loss or risk of bleeding.   Vitamin K 5 mg IV given on 07/01/19 and vitamin K 10 mg IV given on 06/30/19 preoperatively.   Today's INR continues to be < 2 and likely due to the large amounts of vitamin K on board. Will re-dose tonight with a 50% increase in dose and will continue to monitor. Patient is currently on SCDs.  Goal of Therapy:  Goal INR: 2-3 Monitor platelets by anticoagulation protocol: Yes   Plan:  Warfarin 3mg  po x 1 today, follow-up INR in AM.  Monitor daily INR for warfarin dose, CBC, s/s bleeding  Thank you for the interesting consult and for involving pharmacy in this patient's care.  Tamela Gammon, PharmD, BCPS 07/02/2019 7:33 AM PGY-2 Pharmacy Administration Resident Please check [email or web address] for unit-specific pharmacist phone numbers

## 2019-07-02 NOTE — Op Note (Signed)
NAME: Manuel Lewis, Manuel Lewis MEDICAL RECORD [id number] ACCOUNT [id number] DATE OF BIRTH:November 21, 1921 FACILITY: [facility] LOCATION: [facility] PHYSICIAN:Elyna Pangilinan [place], MD  OPERATIVE REPORT  DATE OF PROCEDURE:  07/01/2019  PREOPERATIVE DIAGNOSIS:  Displaced left hip femoral neck fracture.  POSTOPERATIVE DIAGNOSIS:  Displaced left hip femoral neck fracture.  PROCEDURE:  Left total hip arthroplasty through direct anterior approach.  IMPLANTS:  DePuy Sector Gription acetabular component size 54, size 36+4 neutral polyethylene liner, size 13 Corail femoral component with standard offset, size 36+5 metal hip ball.  SURGEON:  Jean Rosenthal, MD  ASSISTANT:  Erskine Emery, PA-C  ANESTHESIA:  General.  ANTIBIOTICS:  Two grams IV Ancef.  ESTIMATED BLOOD LOSS:  150 mL.  COMPLICATIONS:  None.  INDICATIONS:  The patient is a very active 86 year old gentleman who does live alone and drives.  He sustained an accidental mechanical fall yesterday injuring his left hip.  He was seen in the emergency room at [facility] and found to have a displaced  left hip femoral neck fracture.  He was graciously admitted to the medicine service.  He was on Coumadin with an INR of 3, so we had given vitamin K and we were operating today now having his INR now well below 2.  I talked to his son, [name],  who is well known to me.  I discussed the risks and benefits of the surgery and our recommendation for surgery given the fact that he has a high functioning individual.  I talked about hip hemiarthroplasty versus total hip arthroplasty as well.  DESCRIPTION OF PROCEDURE:  After informed consent was obtained and appropriate left hip was marked, he was brought to the operating room where general anesthesia was obtained while he was on a stretcher.  He did have a very large joint effusion of his  left knee.  He has had previous trauma to that knee and does have recurrent effusions with post-traumatic  arthritis.  I did drain about 120-130 mL of yellow-tinged fluid off of the knee consistent with arthritis.  We then placed him supine on the Hana  fracture table, the perineal post in place and both legs in line skeletal traction device and no traction applied.  His left operative hip was prepped and draped with DuraPrep and sterile drapes.  A timeout was called to identify correct patient, correct  left hip.  I then made an incision just inferior and posterior to the anterior iliac spine, carried this obliquely down the leg.  We dissected down tensor fascia lata muscle.  Tensor fascia was then divided longitudinally to proceed with direct anterior  approach to the hip.  We identified and cauterized circumflex vessels and then identified the hip capsule, opened the hip capsule in an L-type format, finding hemarthrosis consistent with a femoral neck fracture and you could see right away there was a  displaced femoral neck fracture.  We placed Cobra retractors around the medial and lateral femoral neck and made a freshening femoral neck cut just distal to the fracture, but proximal to the lesser trochanter.  We did this with an oscillating saw and  completed this with osteotome.  We removed remnants of the fractured femoral neck and then placed a corkscrew guide in the femoral head and removed the femoral head in its entirety.  We then removed bony fragments from the acetabulum as well as the  acetabular labrum.  I placed a bent Hohmann over the medial acetabular rim and then began reaming under direct visualization from a  size 44 reamer in stepwise increments up to a size 53 with all reamers under direct visualization, the last reamer under  direct fluoroscopy, so we could obtain our depth of reaming, our inclination and anteversion.  I then placed the real DePuy Sector Gription acetabular component size 54 and a 36+0 neutral polyethylene liner for that size acetabular component.  Attention  was then turned  to the femur.  With the leg externally rotated to 120 degrees, extended and adducted, we were able to place a Mueller retractor medially and a Hohmann retractor behind the greater trochanter, released lateral joint capsule and used a  box-cutting osteotome to enter the femoral canal and a rongeur to lateralize.  I then began broaching using the Corail broaching system from a size 8 going up to a size 13.  With the 13 in place, we trialed a standard offset femoral neck and a 36+1.5 hip  ball, reduced this in the acetabulum.  We assessed radiographically and mechanically.  I felt like we needed just a little bit more offset and leg length.  We dislocated the hip and removed the trial components.  I then placed the real Corail femoral  component size 13 with standard offset and went with a 36+5 metal hip ball again reduced this in the acetabulum.  We appreciated the stability mechanically and radiographically.  We then irrigated the soft tissue with normal saline solution using  pulsatile lavage.  We reapproximated the joint capsule with interrupted #1 Ethibond suture, followed by using #1 Vicryl to close tensor fascia, 0 Vicryl was used to close deep tissue, 2-0 Vicryl was used to close the subcutaneous tissue and staples were  used to reapproximate the skin.  Xeroform and Aquacel dressing were applied.  The patient was taken off the Hana table, awakened, extubated, and taken to recovery room in stable condition.  All final counts were correct.  There were no complications  noted.    Of note, Benita Stabile, PA-C, assisted during the entire case and assistance was crucial for facilitating all aspects of this case.  PN/NUANCE  D:07/01/2019 T:07/02/2019 JOB:[id number]

## 2019-07-02 NOTE — Progress Notes (Signed)
Manuel Lewis [id number] DOB: 1921-10-26 DOA: 06/30/2019 PCP: [name], MD  HPI/Recap of past 24 hours: HPI from Dr Manuel Lewis is a 86 y.o. male with history of A. fib, complete heart block status post pacemaker placement, hypertension who was walking in the parking lot along with his son and bumped on him and fell and hurt his left hip area.  Denies having hit his head.  Has been having some subconjunctival hemorrhage over the last few days after his Coumadin levels were elevated. In the ED, X-ray showed left hip fracture.  CT head and C-spine were done which shows chronic fracture of the nasal bone.  Orthopedic surgeon Dr. [name] was consulted. Vitamin K 10 mg IV was given as INR elevated to 3. Covid test was negative.  EKG shows normal sinus rhythm with IVCD. Pt admitted for further management.    Today, patient denies any new complaints, eager to work with therapy, with hopes of going home.  Denies any chest pain, abdominal pain, nausea/vomiting, fever/chills.    Assessment/Plan: Principal Problem:   Closed left hip fracture, initial encounter [facility]) Active Problems:   Atrial fibrillation [facility])   Pacemaker   Essential hypertension   Normocytic normochromic anemia   Hip fracture (HCC)   Left hip fracture status post mechanical fall s/p total hip arthroplasty by Dr. [name] on 07/01/2019 Orthopedic surgeon Dr. [name] on board CT head/neck showed chronic nasal bone fracture, nothing acute Management by orthopedics PT/OT  History of chronic A. Fib Heart rate controlled Restart Coumadin  Subconjunctival hemorrhage of left eye Present before the fall Monitor closely  Hypokalemia Replace as needed  Hypertension Hydralazine IV as needed Restart amlodipine, continue to hold telmisartan for now, plan to DC HCTZ due to advanced age  History of complete heart block Status post pacemaker placement      Malnutrition  Type:  Nutrition Problem: Increased nutrient needs Etiology: post-op healing   Malnutrition Characteristics:  Signs/Symptoms: estimated needs   Nutrition Interventions:  Interventions: Ensure Enlive (each supplement provides 350kcal and 20 grams of protein), MVI    Estimated body mass index is 21.4 kg/m as calculated from the following:   Height as of this encounter: 5' 9.02" (1.753 m).   Weight as of this encounter: 65.8 kg.     Code Status: Full  Family Communication: Discussed extensively with patient and son (Dr. Harlow Lewis of plastic surgery) at bedside on 07/02/2019  Disposition Plan: Patient lives alone at home, disposition pending PT/OT evaluation, likely home with [facility], disposition time pending orthopedic signing off   Consultants:  Orthopedics  Procedures:  Left total hip arthroplasty  Antimicrobials:  None  DVT prophylaxis: Coumadin   Objective: Vitals:   07/02/19 0022 07/02/19 0401 07/02/19 0744 07/02/19 1444  BP: 124/68 134/71 135/77 135/67  Pulse: (!) 59 64 67 61  Resp: 19 17 17 17   Temp: 97.9 F (36.6 C) 99 F (37.2 C) 99.3 F (37.4 C) 98 F (36.7 C)  TempSrc: Oral Oral Oral Oral  SpO2: 95% 94% 97% 96%  Weight:      Height:        Intake/Output Summary (Last 24 hours) at 07/02/2019 1614 Last data filed at 07/02/2019 1500 Gross per 24 hour  Intake 2312 ml  Output 1160 ml  Net 1152 ml   Filed Weights   06/30/19 1604 07/01/19 1403  Weight: 65.8 kg 65.8 kg    Exam:  General: NAD, subconjunctival hemorrhage of the left eye, alert,  awake, oriented x4, hard of hearing  Cardiovascular: S1, S2 present  Respiratory: CTAB  Abdomen: Soft, nontender, nondistended, bowel sounds present  Musculoskeletal: No bilateral pedal edema noted, incision on left thigh area C/D/  Skin: Normal  Psychiatry: Normal mood   Data Reviewed: CBC: Recent Labs  Lab 06/30/19 1630 06/30/19 1645 07/01/19 0744 07/02/19 0501  WBC 4.9  --  8.8 10.3   NEUTROABS  --   --   --  8.6*  HGB 12.4* 12.2* 12.3* 11.9*  HCT 36.6* 36.0* 36.1* 35.7*  MCV 97.6  --  96.3 98.6  PLT 302  --  235 [id number]   Basic Metabolic Panel: Recent Labs  Lab 06/30/19 1645 06/30/19 1819 07/01/19 0744 07/02/19 0501  NA 132* 138 136 137  K 6.5* 3.4* 3.4* 3.8  CL 102 104 103 104  CO2  --  23 23 22   GLUCOSE 100* 106* 124* 146*  BUN 42* 25* 26* 25*  CREATININE 0.90 0.92 0.88 1.19  CALCIUM  --  9.4 9.0 8.6*  MG  --   --  1.8  --    GFR: Estimated Creatinine Clearance: 34.6 mL/min (by C-G formula based on SCr of 1.19 mg/dL). Liver Function Tests: Recent Labs  Lab 06/30/19 1819  AST 29  ALT 22  ALKPHOS 44  BILITOT 1.5*  PROT 6.5  ALBUMIN 4.1   No results for input(s): LIPASE, AMYLASE in the last 168 hours. No results for input(s): AMMONIA in the last 168 hours. Coagulation Profile: Recent Labs  Lab 06/30/19 1819 07/01/19 0456 07/01/19 1110 07/02/19 0501  INR 3.0* 1.7* 1.4* 1.4*   Cardiac Enzymes: No results for input(s): CKTOTAL, CKMB, CKMBINDEX, TROPONINI in the last 168 hours. BNP (last 3 results) No results for input(s): PROBNP in the last 8760 hours. HbA1C: No results for input(s): HGBA1C in the last 72 hours. CBG: No results for input(s): GLUCAP in the last 168 hours. Lipid Profile: No results for input(s): CHOL, HDL, LDLCALC, TRIG, CHOLHDL, LDLDIRECT in the last 72 hours. Thyroid Function Tests: No results for input(s): TSH, T4TOTAL, FREET4, T3FREE, THYROIDAB in the last 72 hours. Anemia Panel: No results for input(s): VITAMINB12, FOLATE, FERRITIN, TIBC, IRON, RETICCTPCT in the last 72 hours. Urine analysis:    Component Value Date/Time   COLORURINE YELLOW 06/30/2019 [place] 06/30/2019 1613   LABSPEC 1.012 06/30/2019 1613   PHURINE 7.0 06/30/2019 1613   GLUCOSEU NEGATIVE 06/30/2019 1613   HGBUR NEGATIVE 06/30/2019 1613   BILIRUBINUR NEGATIVE 06/30/2019 1613   KETONESUR NEGATIVE 06/30/2019 1613   PROTEINUR 100  (A) 06/30/2019 1613   NITRITE NEGATIVE 06/30/2019 1613   LEUKOCYTESUR NEGATIVE 06/30/2019 1613   Sepsis Labs: @LABRCNTIP (procalcitonin:4,lacticidven:4)  ) Recent Results (from the past 240 hour(s))  SARS CORONAVIRUS 2 (TAT 6-24 HRS) Nasopharyngeal Nasopharyngeal Swab     Status: None   Collection Time: 06/30/19  5:46 PM   Specimen: Nasopharyngeal Swab  Result Value Ref Range Status   SARS Coronavirus 2 NEGATIVE NEGATIVE Final    Comment: (NOTE) SARS-CoV-2 target nucleic acids are NOT DETECTED. The SARS-CoV-2 RNA is generally detectable in upper and lower respiratory specimens during the acute phase of infection. Negative results do not preclude SARS-CoV-2 infection, do not rule out co-infections with other pathogens, and should not be used as the sole basis for treatment or other patient management decisions. Negative results must be combined with clinical observations, patient history, and epidemiological information. The expected result is Negative. Fact Sheet for Patients: SugarRoll.be Fact Sheet for  Healthcare Providers: https://www.woods-mathews.com/ This test is not yet approved or cleared by the [place] and  has been authorized for detection and/or diagnosis of SARS-CoV-2 by FDA under an Emergency Use Authorization (EUA). This EUA will remain  in effect (meaning this test can be used) for the duration of the COVID-19 declaration under Section 56 4(b)(1) of the Act, 21 U.S.C. section 360bbb-3(b)(1), unless the authorization is terminated or revoked sooner. Performed at [facility], [place] [address], [place], [place] [address]   Surgical pcr screen     Status: None   Collection Time: 06/30/19  9:54 PM   Specimen: Nasal Mucosa; Nasal Swab  Result Value Ref Range Status   MRSA, PCR NEGATIVE NEGATIVE Final   Staphylococcus aureus NEGATIVE NEGATIVE Final    Comment: (NOTE) The Xpert SA Assay (FDA approved for NASAL  specimens in patients 72 years of age and older), is one component of a comprehensive surveillance program. It is not intended to diagnose infection nor to guide or monitor treatment. Performed at [facility], [place] [address], [place], [place] [address]       Studies: DG Pelvis Portable  Result Date: 07/01/2019 CLINICAL DATA:  Status post hip replacement. EXAM: PORTABLE PELVIS 1-2 VIEWS COMPARISON:  C-arm images obtained earlier today. 06/30/2019. FINDINGS: Bipolar left hip prosthesis in satisfactory position and alignment. No fracture or dislocation seen. Multiple prostate radiation seed implants. Lumbosacral spine degenerative changes. IMPRESSION: Satisfactory postoperative appearance of a left hip prosthesis. Electronically Signed   By: Claudie Revering M.D.   On: 07/01/2019 19:21    Scheduled Meds: . docusate sodium  100 mg Oral BID  . feeding supplement (ENSURE ENLIVE)  237 mL Oral Q24H  . multivitamin with minerals  1 tablet Oral Daily  . pantoprazole  40 mg Oral Daily  . tamsulosin  0.4 mg Oral Daily  . warfarin  3 mg Oral ONCE-1600  . Warfarin - Pharmacist Dosing Inpatient   Does not apply q1600    Continuous Infusions: . sodium chloride 75 mL/hr at 07/01/19 1824  . methocarbamol (ROBAXIN) IV       LOS: 2 days     Alma Friendly, MD [facility]  If 7PM-7AM, please contact night-coverage [email or web address] 07/02/2019, 4:14 PM

## 2019-07-03 LAB — CBC WITH DIFFERENTIAL/PLATELET
Abs Immature Granulocytes: 0.07 10*3/uL (ref 0.00–0.07)
Basophils Absolute: 0 10*3/uL (ref 0.0–0.1)
Basophils Relative: 0 %
Eosinophils Absolute: 0 10*3/uL (ref 0.0–0.5)
Eosinophils Relative: 0 %
HCT: 31.1 % — ABNORMAL LOW (ref 39.0–52.0)
Hemoglobin: 10.6 g/dL — ABNORMAL LOW (ref 13.0–17.0)
Immature Granulocytes: 1 %
Lymphocytes Relative: 9 %
Lymphs Abs: 1 10*3/uL (ref 0.7–4.0)
MCH: 33.3 pg (ref 26.0–34.0)
MCHC: 34.1 g/dL (ref 30.0–36.0)
MCV: 97.8 fL (ref 80.0–100.0)
Monocytes Absolute: 1.4 10*3/uL — ABNORMAL HIGH (ref 0.1–1.0)
Monocytes Relative: 13 %
Neutro Abs: 7.9 10*3/uL — ABNORMAL HIGH (ref 1.7–7.7)
Neutrophils Relative %: 77 %
Platelets: 187 10*3/uL (ref 150–400)
RBC: 3.18 MIL/uL — ABNORMAL LOW (ref 4.22–5.81)
RDW: 13.1 % (ref 11.5–15.5)
WBC: 10.3 10*3/uL (ref 4.0–10.5)
nRBC: 0 % (ref 0.0–0.2)

## 2019-07-03 LAB — BASIC METABOLIC PANEL
Anion gap: 9 (ref 5–15)
BUN: 33 mg/dL — ABNORMAL HIGH (ref 8–23)
CO2: 21 mmol/L — ABNORMAL LOW (ref 22–32)
Calcium: 8.4 mg/dL — ABNORMAL LOW (ref 8.9–10.3)
Chloride: 102 mmol/L (ref 98–111)
Creatinine, Ser: 1.25 mg/dL — ABNORMAL HIGH (ref 0.61–1.24)
GFR calc Af Amer: 56 mL/min — ABNORMAL LOW (ref >60)
GFR calc non Af Amer: 49 mL/min — ABNORMAL LOW (ref >60)
Glucose, Bld: 122 mg/dL — ABNORMAL HIGH (ref 70–99)
Potassium: 3.5 mmol/L (ref 3.5–5.1)
Sodium: 132 mmol/L — ABNORMAL LOW (ref 135–145)

## 2019-07-03 LAB — PROTIME-INR
INR: 1.3 — ABNORMAL HIGH (ref 0.8–1.2)
Prothrombin Time: 16.1 seconds — ABNORMAL HIGH (ref 11.4–15.2)

## 2019-07-03 MED ORDER — WARFARIN SODIUM 3 MG PO TABS
3.0000 mg | ORAL_TABLET | Freq: Once | ORAL | Status: AC
  Administered 2019-07-03: 3 mg via ORAL
  Filled 2019-07-03: qty 1

## 2019-07-03 NOTE — Evaluation (Signed)
Occupational Therapy Evaluation Patient Details Name: Manuel Lewis MRN: [id number] DOB: 1921-10-01 Today's Date: 07/03/2019    History of Present Illness LUISANTONIO Lewis is a 86 y.o. male admitted with left hip fx s/p fall.  Underwent Anterior THR 07/01/19.  PMH significant for hx of A. fib, complete heart block status post pacemaker placement, hypertension   Clinical Impression   Pt admitted with above. He demonstrates the below listed deficits and will benefit from continued OT to maximize safety and independence with BADLs.  Pt requires min guard - mod A for ADLs, and min A - min guard assist for functional mobility.  PTA, he lived alone, was active, and independent with ADLs and functional mobility.  He is very motivated to regain his independence, and to return home.  Family is in process of attempting to arrange 24 hour assist. Anticipate excellent progress.  Will follow acutely.       Follow Up Recommendations  Home health OT;Supervision/Assistance - 24 hour    Equipment Recommendations  3 in 1 bedside commode    Recommendations for Other Services       Precautions / Restrictions Precautions Precautions: Fall Restrictions Weight Bearing Restrictions: Yes LLE Weight Bearing: Weight bearing as tolerated      Mobility Bed Mobility               General bed mobility comments: up in chair   Transfers Overall transfer level: Needs assistance Equipment used: Rolling walker (2 wheeled) Transfers: Sit to/from [facility] Sit to Stand: Min assist Stand pivot transfers: Min guard       General transfer comment: min A to power up from seated position, and verbal cues for hand placement and sequencing     Balance Overall balance assessment: Needs assistance Sitting-balance support: No upper extremity supported;Feet supported Sitting balance-Leahy Scale: Good     Standing balance support: During functional activity;Single extremity  supported Standing balance-Leahy Scale: Poor Standing balance comment: reliant on UE support  for balance                           ADL either performed or assessed with clinical judgement   ADL Overall ADL's : Needs assistance/impaired Eating/Feeding: Independent   Grooming: Wash/dry hands;Wash/dry face;Oral care;Brushing hair;Minimal assistance;Standing Grooming Details (indicate cue type and reason): requires min A for dynamic standing  Upper Body Bathing: Set up;Sitting   Lower Body Bathing: Minimal assistance;Sit to/from stand Lower Body Bathing Details (indicate cue type and reason): difficulty accessing Lt foot  Upper Body Dressing : Set up;Sitting;Supervision/safety   Lower Body Dressing: Moderate assistance;Sit to/from stand Lower Body Dressing Details (indicate cue type and reason): assist to don Lt shoe and sock as well assist for balance when pulling up pants  Toilet Transfer: Ambulation;Min guard;Comfort height toilet;Grab bars;RW   Toileting- Clothing Manipulation and Hygiene: Minimal assistance;Sit to/from stand       Functional mobility during ADLs: Min guard;Rolling walker;Minimal assistance       Vision         Perception     Praxis      Pertinent Vitals/Pain Pain Assessment: Faces Faces Pain Scale: Hurts little more Pain Location: Lt hip  Pain Descriptors / Indicators: Operative site guarding Pain Intervention(s): Monitored during session     Hand Dominance Right   Extremity/Trunk Assessment Upper Extremity Assessment Upper Extremity Assessment: Generalized weakness   Lower Extremity Assessment Lower Extremity Assessment: Defer to PT evaluation   Cervical /  Trunk Assessment Cervical / Trunk Assessment: Kyphotic   Communication Communication Communication: HOH   Cognition Arousal/Alertness: Awake/alert Behavior During Therapy: WFL for tasks assessed/performed Overall Cognitive Status: Within Functional Limits for tasks  assessed                                     General Comments  son present during session.  He reports he and sister are attempting to arrange 24/7 assist at discharge.      Exercises     Shoulder Instructions      Home Living Family/patient expects to be discharged to:: Private residence Living Arrangements: Alone Available Help at Discharge: Family;Available PRN/intermittently Type of Home: House Home Access: Stairs to enter [employer] of Steps: 4 Entrance Stairs-Rails: Right Home Layout: Two level;Able to live on main level with bedroom/bathroom     Bathroom Shower/Tub: Occupational psychologist: Handicapped height     Home Equipment: Shower seat;Grab bars - tub/shower;Grab bars - toilet          Prior Functioning/Environment Level of Independence: Independent        Comments: still driving.  Enjoys flying, he was a Astronomer and a Print production planner.  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, commissioning Problem List: Decreased strength;Impaired balance (sitting and/or standing);Decreased knowledge of use of DME or AE;Pain;Decreased activity tolerance      OT Treatment/Interventions: Self-care/ADL training;DME and/or AE instruction;Therapeutic activities;Patient/family education;Balance training    OT Goals(Current goals can be found in the care plan section) Acute Rehab OT Goals Patient Stated Goal: to get back on my feet  OT Goal Formulation: With patient/family Time For Goal Achievement: 07/10/19 Potential to Achieve Goals: Good ADL Goals Pt Will Perform Grooming: with supervision;standing Pt Will Perform Lower Body Bathing: with supervision;sit to/from stand Pt Will Perform Lower Body Dressing: with supervision;sit to/from stand Pt Will Transfer to Toilet: with supervision;ambulating;bedside commode;regular height toilet;grab bars Pt Will Perform Toileting - Clothing Manipulation and hygiene: with  supervision;sit to/from stand  OT Frequency: Min 2X/week   Barriers to D/C:            Co-evaluation              AM-PAC OT "6 Clicks" Daily Activity     Outcome Measure Help from another person eating meals?: None Help from another person taking care of personal grooming?: A Little Help from another person toileting, which includes using toliet, bedpan, or urinal?: A Little Help from another person bathing (including washing, rinsing, drying)?: A Little Help from another person to put on and taking off regular upper body clothing?: A Little Help from another person to put on and taking off regular lower body clothing?: A Little 6 Click Score: 19   End of Session Equipment Utilized During Treatment: Gait belt;Rolling walker Nurse Communication: Mobility status  Activity Tolerance: Patient tolerated treatment well Patient left: in chair;with call bell/phone within reach;with family/visitor present  OT Visit Diagnosis: Unsteadiness on feet (R26.81);Pain Pain - Right/Left: Left Pain - part of body: Hip                Time: 1229-1310 OT Time Calculation (min): 41 min Charges:  OT General Charges $OT Visit: 1 Visit OT Evaluation $OT Eval Moderate Complexity: 1 Mod OT Treatments $Self Care/Home Management : 23-37 mins  Nilsa Nutting., OTR/L Acute Rehabilitation Services Pager [phone] Office  [phone]   Lucille Passy M 07/03/2019, 4:17 PM

## 2019-07-03 NOTE — Progress Notes (Addendum)
ANTICOAGULATION CONSULT NOTE - Initial Consult  Pharmacy Consult for warfarin Indication: atrial fibrillation  No Known Allergies  Patient Measurements: Height: 5' 9.02" (175.3 cm) Weight: 65.8 kg (145 lb) IBW/kg (Calculated) : 70.74  Vital Signs: Temp: 99.2 F (37.3 C) (04/18 0409) Temp Source: Oral (04/18 0409) BP: 115/60 (04/18 0409) Pulse Rate: 60 (04/18 0409)  Labs: Recent Labs    07/01/19 0456 07/01/19 0744 07/01/19 0744 07/01/19 1110 07/02/19 0501 07/03/19 0155  HGB  --  12.3*   < >  --  11.9* 10.6*  HCT  --  36.1*  --   --  35.7* 31.1*  PLT  --  235  --   --  208 187  LABPROT   < >  --   --  16.9* 17.1* 16.1*  INR   < >  --   --  1.4* 1.4* 1.3*  CREATININE  --  0.88  --   --  1.19 1.25*   < > = values in this interval not displayed.    Estimated Creatinine Clearance: 32.9 mL/min (A) (by C-G formula based on SCr of 1.25 mg/dL (H)).   Medical History: Past Medical History:  Diagnosis Date  . Atrial fibrillation ([place])   . Hearing loss   . Hypertension     Medications:  Medications Prior to Admission  Medication Sig Dispense Refill Last Dose  . amLODipine (NORVASC) 5 MG tablet Take 5 mg by mouth daily.   06/30/2019 at Unknown time  . hydrochlorothiazide (HYDRODIURIL) 25 MG tablet Take 25 mg by mouth daily.   06/30/2019 at Unknown time  . telmisartan (MICARDIS) 80 MG tablet Take 80 mg by mouth daily.   06/30/2019 at Unknown time  . warfarin (COUMADIN) 2 MG tablet Take 2 mg by mouth See admin instructions. 1mg  daily, except on Mondays - take 2mg  as directed.   Past Week at Unknown time    Assessment: Pharmacy consulted to continue PTA warfarin following surgery for left hip fracture.  PTA warfarin regimen is 1 mg daily except 2 mg on Mondays. Noted to be hemodynamically stable in PACU and not indicated to delay start of pharmacologic VTE agent due to surgical blood loss or risk of bleeding.   Vitamin K 5 mg IV given on 07/01/19 and vitamin K 10 mg IV given on  06/30/19 preoperatively.   Patient's CBC stable, INR continues to be < 2 and likely due to the large amounts of vitamin K still on board... patient is eating well. Will re-dose again with the increased dose and will continue to monitor.  Patient is currently on SCDs.  Goal of Therapy:  Goal INR: 2-3 Monitor platelets by anticoagulation protocol: Yes   Plan:  Warfarin 3mg  po x 1 today, follow-up INR in AM.  Monitor daily INR for warfarin dose, CBC, s/s bleeding  Thank you for the interesting consult and for involving pharmacy in this patient's care.  Tamela Gammon, PharmD, BCPS 07/03/2019 7:20 AM PGY-2 Pharmacy Administration Resident Please check [email or web address] for unit-specific pharmacist phone numbers

## 2019-07-03 NOTE — Progress Notes (Signed)
   Subjective: 2 Days Post-Op Procedure(s) (LRB): ANTERIOR TOTAL HIP ARTHROPLASTY (Left) Fine Needle Aspiration of left knee (Left) Patient reports pain as mild.  Walked up and down the hall with therapy. In recliner now   Objective: Vital signs in last 24 hours: Temp:  [98 F (36.7 C)-99.2 F (37.3 C)] 99.2 F (37.3 C) (04/18 0409) Pulse Rate:  [60-73] 60 (04/18 0409) Resp:  [15-18] 15 (04/18 0409) BP: (115-139)/(60-77) 115/60 (04/18 0409) SpO2:  [91 %-96 %] 91 % (04/18 0409)  Intake/Output from previous day: 04/17 0701 - 04/18 0700 In: 920 [P.O.:720; I.V.:200] Out: 805 [Urine:805] Intake/Output this shift: No intake/output data recorded.  Recent Labs    06/30/19 1630 06/30/19 1645 07/01/19 0744 07/02/19 0501 07/03/19 0155  HGB 12.4* 12.2* 12.3* 11.9* 10.6*   Recent Labs    07/02/19 0501 07/03/19 0155  WBC 10.3 10.3  RBC 3.62* 3.18*  HCT 35.7* 31.1*  PLT 208 187   Recent Labs    07/02/19 0501 07/03/19 0155  NA 137 132*  K 3.8 3.5  CL 104 102  CO2 22 21*  BUN 25* 33*  CREATININE 1.19 1.25*  GLUCOSE 146* 122*  CALCIUM 8.6* 8.4*   Recent Labs    07/02/19 0501 07/03/19 0155  INR 1.4* 1.3*    Neurologically intact No results found.  Assessment/Plan: 2 Days Post-Op Procedure(s) (LRB): ANTERIOR TOTAL HIP ARTHROPLASTY (Left) Fine Needle Aspiration of left knee (Left) Up with therapy. He is making good progress.   Marybelle Killings 07/03/2019, 9:29 AM

## 2019-07-03 NOTE — Plan of Care (Signed)

## 2019-07-03 NOTE — Plan of Care (Signed)
  Problem: Pain Managment: Goal: General experience of comfort will improve Outcome: Progressing   Problem: Safety: Goal: Ability to remain free from injury will improve Outcome: Progressing   Problem: Skin Integrity: Goal: Risk for impaired skin integrity will decrease Outcome: Progressing   

## 2019-07-03 NOTE — Progress Notes (Signed)
PROGRESS NOTE  Manuel Lewis [id number] DOB: Jul 10, 1921 DOA: 06/30/2019 PCP: [name], MD  HPI/Recap of past 24 hours: HPI from Dr [name] is a 86 y.o. male with history of A. fib, complete heart block status post pacemaker placement, hypertension who was walking in the parking lot along with his son and bumped on him and fell and hurt his left hip area.  Denies having hit his head.  Has been having some subconjunctival hemorrhage over the last few days after his Coumadin levels were elevated. In the ED, X-ray showed left hip fracture.  CT head and C-spine were done which shows chronic fracture of the nasal bone.  Orthopedic surgeon Dr. [name] was consulted. Vitamin K 10 mg IV was given as INR elevated to 3. Covid test was negative.  EKG shows normal sinus rhythm with IVCD. Pt admitted for further management.    Today, met patient talking on the phone with daughter, reports.  Pain that is controlled with pain meds.  Denies any other new complaints.  Making progress with PT/OT    Assessment/Plan: Principal Problem:   Closed left hip fracture, initial encounter [facility]) Active Problems:   Atrial fibrillation (HCC)   Pacemaker   Essential hypertension   Normocytic normochromic anemia   Hip fracture (HCC)   Left hip fracture status post mechanical fall s/p total hip arthroplasty by Dr. [name] on 07/01/2019 Orthopedic surgeon Dr. [name] on board CT head/neck showed chronic nasal bone fracture, nothing acute Management by orthopedics PT/OT  History of chronic A. Fib Heart rate controlled Restart Coumadin  Subconjunctival hemorrhage of left eye Present before the fall Monitor closely  Hypokalemia Replace as needed  Hypertension Hydralazine IV as needed Restart amlodipine, continue to hold telmisartan for now, plan to DC HCTZ due to advanced age  History of complete heart block Status post pacemaker placement      Malnutrition Type:   Nutrition Problem: Increased nutrient needs Etiology: post-op healing   Malnutrition Characteristics:  Signs/Symptoms: estimated needs   Nutrition Interventions:  Interventions: Ensure Enlive (each supplement provides 350kcal and 20 grams of protein), MVI    Estimated body mass index is 21.4 kg/m as calculated from the following:   Height as of this encounter: 5' 9.02" (1.753 m).   Weight as of this encounter: 65.8 kg.     Code Status: Full  Family Communication: Discussed extensively with patient and son (Dr. [name] of plastic surgery) at bedside on 07/02/2019  Disposition Plan: Patient lives alone at home, likely home with [facility], disposition time pending orthopedic signing off   Consultants:  Orthopedics  Procedures:  Left total hip arthroplasty  Antimicrobials:  None  DVT prophylaxis: Coumadin   Objective: Vitals:   07/02/19 1600 07/02/19 2004 07/03/19 0409 07/03/19 1005  BP: 137/77 139/74 115/60 122/85  Pulse:  73 60 62  Resp:  '18 15 17  ' Temp:  98.7 F (37.1 C) 99.2 F (37.3 C) 98.4 F (36.9 C)  TempSrc:  Oral Oral Oral  SpO2:  96% 91% 97%  Weight:      Height:        Intake/Output Summary (Last 24 hours) at 07/03/2019 1652 Last data filed at 07/03/2019 1627 Gross per 24 hour  Intake 720 ml  Output 545 ml  Net 175 ml   Filed Weights   06/30/19 1604 07/01/19 1403  Weight: 65.8 kg 65.8 kg    Exam:  General: NAD, subconjunctival hemorrhage of the left eye, alert, awake, oriented x4, hard  of hearing  Cardiovascular: S1, S2 present  Respiratory: CTAB  Abdomen: Soft, nontender, nondistended, bowel sounds present  Musculoskeletal: No bilateral pedal edema noted, incision on left thigh area C/D/I  Skin: Normal  Psychiatry: Normal mood   Data Reviewed: CBC: Recent Labs  Lab 06/30/19 1630 06/30/19 1645 07/01/19 0744 07/02/19 0501 07/03/19 0155  WBC 4.9  --  8.8 10.3 10.3  NEUTROABS  --   --   --  8.6* 7.9*  HGB 12.4* 12.2* 12.3*  11.9* 10.6*  HCT 36.6* 36.0* 36.1* 35.7* 31.1*  MCV 97.6  --  96.3 98.6 97.8  PLT 302  --  235 208 503   Basic Metabolic Panel: Recent Labs  Lab 06/30/19 1645 06/30/19 1819 07/01/19 0744 07/02/19 0501 07/03/19 0155  NA 132* 138 136 137 132*  K 6.5* 3.4* 3.4* 3.8 3.5  CL 102 104 103 104 102  CO2  --  '23 23 22 ' 21*  GLUCOSE 100* 106* 124* 146* 122*  BUN 42* 25* 26* 25* 33*  CREATININE 0.90 0.92 0.88 1.19 1.25*  CALCIUM  --  9.4 9.0 8.6* 8.4*  MG  --   --  1.8  --   --    GFR: Estimated Creatinine Clearance: 32.9 mL/min (A) (by C-G formula based on SCr of 1.25 mg/dL (H)). Liver Function Tests: Recent Labs  Lab 06/30/19 1819  AST 29  ALT 22  ALKPHOS 44  BILITOT 1.5*  PROT 6.5  ALBUMIN 4.1   No results for input(s): LIPASE, AMYLASE in the last 168 hours. No results for input(s): AMMONIA in the last 168 hours. Coagulation Profile: Recent Labs  Lab 06/30/19 1819 07/01/19 0456 07/01/19 1110 07/02/19 0501 07/03/19 0155  INR 3.0* 1.7* 1.4* 1.4* 1.3*   Cardiac Enzymes: No results for input(s): CKTOTAL, CKMB, CKMBINDEX, TROPONINI in the last 168 hours. BNP (last 3 results) No results for input(s): PROBNP in the last 8760 hours. HbA1C: No results for input(s): HGBA1C in the last 72 hours. CBG: No results for input(s): GLUCAP in the last 168 hours. Lipid Profile: No results for input(s): CHOL, HDL, LDLCALC, TRIG, CHOLHDL, LDLDIRECT in the last 72 hours. Thyroid Function Tests: No results for input(s): TSH, T4TOTAL, FREET4, T3FREE, THYROIDAB in the last 72 hours. Anemia Panel: No results for input(s): VITAMINB12, FOLATE, FERRITIN, TIBC, IRON, RETICCTPCT in the last 72 hours. Urine analysis:    Component Value Date/Time   COLORURINE YELLOW 06/30/2019 1613   APPEARANCEUR CLEAR 06/30/2019 1613   LABSPEC 1.012 06/30/2019 1613   PHURINE 7.0 06/30/2019 1613   GLUCOSEU NEGATIVE 06/30/2019 1613   HGBUR NEGATIVE 06/30/2019 1613   BILIRUBINUR NEGATIVE 06/30/2019 1613    KETONESUR NEGATIVE 06/30/2019 1613   PROTEINUR 100 (A) 06/30/2019 1613   NITRITE NEGATIVE 06/30/2019 1613   LEUKOCYTESUR NEGATIVE 06/30/2019 1613   Sepsis Labs: '@LABRCNTIP' (procalcitonin:4,lacticidven:4)  ) Recent Results (from the past 240 hour(s))  SARS CORONAVIRUS 2 (TAT 6-24 HRS) Nasopharyngeal Nasopharyngeal Swab     Status: None   Collection Time: 06/30/19  5:46 PM   Specimen: Nasopharyngeal Swab  Result Value Ref Range Status   SARS Coronavirus 2 NEGATIVE NEGATIVE Final    Comment: (NOTE) SARS-CoV-2 target nucleic acids are NOT DETECTED. The SARS-CoV-2 RNA is generally detectable in upper and lower respiratory specimens during the acute phase of infection. Negative results do not preclude SARS-CoV-2 infection, do not rule out co-infections with other pathogens, and should not be used as the sole basis for treatment or other patient management decisions. Negative results must be  combined with clinical observations, patient history, and epidemiological information. The expected result is Negative. Fact Sheet for Patients: SugarRoll.be Fact Sheet for Healthcare Providers: https://www.woods-mathews.com/ This test is not yet approved or cleared by the [place] FDA and  has been authorized for detection and/or diagnosis of SARS-CoV-2 by FDA under an Emergency Use Authorization (EUA). This EUA will remain  in effect (meaning this test can be used) for the duration of the COVID-19 declaration under Section 56 4(b)(1) of the Act, 21 U.S.C. section 360bbb-3(b)(1), unless the authorization is terminated or revoked sooner. Performed at [facility], [place] [address], [place], [place] [address]   Surgical pcr screen     Status: None   Collection Time: 06/30/19  9:54 PM   Specimen: Nasal Mucosa; Nasal Swab  Result Value Ref Range Status   MRSA, PCR NEGATIVE NEGATIVE Final   Staphylococcus aureus NEGATIVE NEGATIVE Final    Comment:  (NOTE) The Xpert SA Assay (FDA approved for NASAL specimens in patients 23 years of age and older), is one component of a comprehensive surveillance program. It is not intended to diagnose infection nor to guide or monitor treatment. Performed at [facility], [place] [address], [place], [place] [address]       Studies: No results found.  Scheduled Meds: . amLODipine  5 mg Oral Daily  . docusate sodium  100 mg Oral BID  . feeding supplement (ENSURE ENLIVE)  237 mL Oral Q24H  . multivitamin with minerals  1 tablet Oral Daily  . pantoprazole  40 mg Oral Daily  . tamsulosin  0.4 mg Oral Daily  . Warfarin - Pharmacist Dosing Inpatient   Does not apply q1600    Continuous Infusions: . methocarbamol (ROBAXIN) IV       LOS: 3 days     Alma Friendly, MD [facility]  If 7PM-7AM, please contact night-coverage [email or web address] 07/03/2019, 4:52 PM

## 2019-07-04 ENCOUNTER — Encounter: Payer: Self-pay | Admitting: *Deleted

## 2019-07-04 LAB — PROTIME-INR
INR: 1.6 — ABNORMAL HIGH (ref 0.8–1.2)
Prothrombin Time: 19.1 seconds — ABNORMAL HIGH (ref 11.4–15.2)

## 2019-07-04 LAB — CBC WITH DIFFERENTIAL/PLATELET
Abs Immature Granulocytes: 0.04 10*3/uL (ref 0.00–0.07)
Basophils Absolute: 0 10*3/uL (ref 0.0–0.1)
Basophils Relative: 0 %
Eosinophils Absolute: 0.2 10*3/uL (ref 0.0–0.5)
Eosinophils Relative: 3 %
HCT: 28.6 % — ABNORMAL LOW (ref 39.0–52.0)
Hemoglobin: 9.8 g/dL — ABNORMAL LOW (ref 13.0–17.0)
Immature Granulocytes: 1 %
Lymphocytes Relative: 11 %
Lymphs Abs: 0.9 10*3/uL (ref 0.7–4.0)
MCH: 33.8 pg (ref 26.0–34.0)
MCHC: 34.3 g/dL (ref 30.0–36.0)
MCV: 98.6 fL (ref 80.0–100.0)
Monocytes Absolute: 0.8 10*3/uL (ref 0.1–1.0)
Monocytes Relative: 10 %
Neutro Abs: 6.1 10*3/uL (ref 1.7–7.7)
Neutrophils Relative %: 75 %
Platelets: 206 10*3/uL (ref 150–400)
RBC: 2.9 MIL/uL — ABNORMAL LOW (ref 4.22–5.81)
RDW: 13.2 % (ref 11.5–15.5)
WBC: 8.1 10*3/uL (ref 4.0–10.5)
nRBC: 0 % (ref 0.0–0.2)

## 2019-07-04 LAB — BASIC METABOLIC PANEL
Anion gap: 11 (ref 5–15)
BUN: 39 mg/dL — ABNORMAL HIGH (ref 8–23)
CO2: 21 mmol/L — ABNORMAL LOW (ref 22–32)
Calcium: 8.3 mg/dL — ABNORMAL LOW (ref 8.9–10.3)
Chloride: 102 mmol/L (ref 98–111)
Creatinine, Ser: 1.13 mg/dL (ref 0.61–1.24)
GFR calc Af Amer: >60 mL/min (ref >60)
GFR calc non Af Amer: 55 mL/min — ABNORMAL LOW (ref >60)
Glucose, Bld: 116 mg/dL — ABNORMAL HIGH (ref 70–99)
Potassium: 3.4 mmol/L — ABNORMAL LOW (ref 3.5–5.1)
Sodium: 134 mmol/L — ABNORMAL LOW (ref 135–145)

## 2019-07-04 MED ORDER — HYDROCODONE-ACETAMINOPHEN 5-325 MG PO TABS: 1.0000 | ORAL_TABLET | Freq: Four times a day (QID) | ORAL | 0 refills | Status: DC | PRN

## 2019-07-04 MED ORDER — WARFARIN SODIUM 3 MG PO TABS
3.0000 mg | ORAL_TABLET | Freq: Once | ORAL | Status: AC
  Administered 2019-07-04: 3 mg via ORAL
  Filled 2019-07-04: qty 1

## 2019-07-04 NOTE — Progress Notes (Signed)
NCM spoke with pt and daughter @ bedside regarding transition of care needs. PT/OT  evaluation and recommendations reinforced: Home health PT, Home health OT;Supervision/Assistance - 24 hour. Preference is for dad to d/c to home with home health services. Pt lives alone and without 24hr supervision and assistance. Daughter is trying to land agency to assist with care. Pt/daughter gave NCM the ok to fax out to  SNFs  for ? SNF placement. NCM to f/u with bed offers... Whitman Hero RN,BSN,CM

## 2019-07-04 NOTE — Progress Notes (Signed)
Patient ID: Manuel Lewis, male   DOB: July 10, 1921, 86 y.o.   MRN: [id number] Mr. Wickenhauser looks good today.  He continues to make good progress with therapy.  Therapy is apparently recommending home therapy with close supervision.  I do feel that he is making good progress as well based on what I am seeing and reviewing the notes.  I did speak with him at the bedside and spoke with his daughter.  I also changed his left hip dressing and his incision looks good.  If things can be set up from a home health standpoint, I am fine with him being discharged when those things are made available for him in the family.

## 2019-07-04 NOTE — Discharge Instructions (Signed)

## 2019-07-04 NOTE — Progress Notes (Signed)
Occupational Therapy Treatment Patient Details Name: Manuel Lewis MRN: [id number] DOB: 1921-11-29 Today's Date: 07/04/2019    History of present illness Manuel Lewis is a 86 y.o. male admitted with left hip fx s/p fall.  Underwent Anterior THR 07/01/19.  PMH significant for hx of A. fib, complete heart block status post pacemaker placement, hypertension   OT comments  Pt is making excellent progress toward goals.  He is able to perform LB ADLs with min A, and requires min guard assist for functional mobility.  Daughter instructed in use of gait belt, and and safety options for night time - use of urinal, and she plans to use baby monitor to communicate when she is not in the same room.   Follow Up Recommendations  Home health OT;Supervision/Assistance - 24 hour    Equipment Recommendations  3 in 1 bedside commode    Recommendations for Other Services      Precautions / Restrictions Precautions Precautions: Fall Restrictions Weight Bearing Restrictions: Yes LLE Weight Bearing: Weight bearing as tolerated       Mobility Bed Mobility               General bed mobility comments: Pt seated in recliner chair on arrival.  Transfers Overall transfer level: Needs assistance Equipment used: Rolling walker (2 wheeled) Transfers: Sit to/from [facility] Sit to Stand: Min guard Stand pivot transfers: Min guard       General transfer comment: Cues for hand placement to and from seated surface.    Balance Overall balance assessment: Needs assistance Sitting-balance support: No upper extremity supported;Feet supported Sitting balance-Leahy Scale: Good     Standing balance support: During functional activity;Single extremity supported Standing balance-Leahy Scale: Poor Standing balance comment: reliant on UE support  for balance                           ADL either performed or assessed with clinical judgement   ADL Overall ADL's : Needs  assistance/impaired             Lower Body Bathing: Minimal assistance;Sit to/from stand       Lower Body Dressing: Minimal assistance;Sit to/from stand Lower Body Dressing Details (indicate cue type and reason): Pt able to simulate donning pants with min A for Lt foot, requires assist for Lt shoe and socks      Toileting- Clothing Manipulation and Hygiene: Min guard;Sit to/from stand       Functional mobility during ADLs: Min guard;Rolling walker General ADL Comments: daughter present.  Reviewed safety with ADLs and functional mobility, how to use a gait belt, and recommendation to use urinal at night to avoid ambulating to BR.   Discussed option for AE for LB dressing, but recommend pt continue to attempt to access his feet.  He is currently able to bend forward to access proximal ankle      Vision       Perception     Praxis      Cognition Arousal/Alertness: Awake/alert Behavior During Therapy: WFL for tasks assessed/performed Overall Cognitive Status: Within Functional Limits for tasks assessed                                          Exercises Total Joint Exercises Ankle Circles/Pumps: AROM;Both;20 reps;Supine Quad Sets: AROM;Both;10 reps;Supine Heel Slides: AROM;AAROM;Both;10 reps;Supine Hip ABduction/ADduction: AROM;AAROM;Both;10  reps;Supine   Shoulder Instructions       General Comments daughter reports pt does not have area rugs     Pertinent Vitals/ Pain       Pain Assessment: Faces Faces Pain Scale: Hurts little more Pain Location: Lt hip  Pain Descriptors / Indicators: Operative site guarding Pain Intervention(s): Monitored during session;Patient requesting pain meds-RN notified  Home Living                                          Prior Functioning/Environment              Frequency  Min 2X/week        Progress Toward Goals  OT Goals(current goals can now be found in the care plan section)   Progress towards OT goals: Progressing toward goals  Acute Rehab OT Goals Patient Stated Goal: to get back on my feet   Plan Discharge plan remains appropriate    Co-evaluation                 AM-PAC OT "6 Clicks" Daily Activity     Outcome Measure   Help from another person eating meals?: None Help from another person taking care of personal grooming?: A Little Help from another person toileting, which includes using toliet, bedpan, or urinal?: A Little Help from another person bathing (including washing, rinsing, drying)?: A Little Help from another person to put on and taking off regular upper body clothing?: A Little Help from another person to put on and taking off regular lower body clothing?: A Little 6 Click Score: 19    End of Session Equipment Utilized During Treatment: Gait belt;Rolling walker  OT Visit Diagnosis: Unsteadiness on feet (R26.81);Pain Pain - Right/Left: Left Pain - part of body: Hip   Activity Tolerance Patient tolerated treatment well   Patient Left in chair;with call bell/phone within reach   Nurse Communication Mobility status        Time: 1458-1540 OT Time Calculation (min): 42 min  Charges: OT General Charges $OT Visit: 1 Visit OT Treatments $Self Care/Home Management : 38-52 mins  Nilsa Nutting OTR/L Acute Rehabilitation Services Pager [phone] Office [phone]    Manuel Lewis 07/04/2019, 4:09 PM

## 2019-07-04 NOTE — Progress Notes (Signed)
Physical Therapy Treatment Patient Details Name: Manuel Lewis MRN: [id number] DOB: 12-15-1921 Today's Date: 07/04/2019    History of Present Illness Manuel Lewis is a 86 y.o. male admitted with left hip fx s/p fall.  Underwent Anterior THR 07/01/19.  PMH significant for hx of A. fib, complete heart block status post pacemaker placement, hypertension    PT Comments    Pt is progressing well this session.  He continues to make steady progress and on track to return home.  Plan for progression of stair training next session. He will require equipment listed below.     Follow Up Recommendations  Home health PT     Equipment Recommendations  Rolling walker with 5" wheels;3in1 (PT)    Recommendations for Other Services       Precautions / Restrictions Precautions Precautions: Fall Restrictions Weight Bearing Restrictions: Yes LLE Weight Bearing: Weight bearing as tolerated    Mobility  Bed Mobility               General bed mobility comments: Pt seated in recliner chair on arrival.  Transfers Overall transfer level: Needs assistance Equipment used: Rolling walker (2 wheeled) Transfers: Sit to/from Stand Sit to Stand: Min guard         General transfer comment: Cues for hand placement to and from seated surface.  Ambulation/Gait Ambulation/Gait assistance: Min guard Gait Distance (Feet): 120 Feet Assistive device: Rolling walker (2 wheeled) Gait Pattern/deviations: Step-through pattern;Shuffle;Trunk flexed(emerging) Gait velocity: decreased   General Gait Details: Pt required cues for forward gaze and scapular retraction.  He is progressing step well, reports fatigue in B UEs.   Stairs Stairs: Yes Stairs assistance: Min assist Stair Management: One rail Left Number of Stairs: 2 General stair comments: Cues for sequencing and hand placement.   Wheelchair Mobility    Modified Rankin (Stroke Patients Only)       Balance Overall balance  assessment: Needs assistance   Sitting balance-Leahy Scale: Good       Standing balance-Leahy Scale: Poor Standing balance comment: reliant on UE support  for balance                            Cognition Arousal/Alertness: Awake/alert Behavior During Therapy: WFL for tasks assessed/performed Overall Cognitive Status: Within Functional Limits for tasks assessed                                        Exercises Total Joint Exercises Ankle Circles/Pumps: AROM;Both;20 reps;Supine Quad Sets: AROM;Both;10 reps;Supine Heel Slides: AROM;AAROM;Both;10 reps;Supine Hip ABduction/ADduction: AROM;AAROM;Both;10 reps;Supine    General Comments        Pertinent Vitals/Pain Pain Assessment: Faces Faces Pain Scale: Hurts little more Pain Location: Lt hip  Pain Descriptors / Indicators: Operative site guarding Pain Intervention(s): Monitored during session;Repositioned    Home Living                      Prior Function            PT Goals (current goals can now be found in the care plan section) Acute Rehab PT Goals Patient Stated Goal: to get back on my feet  Potential to Achieve Goals: Good Progress towards PT goals: Progressing toward goals    Frequency    Min 5X/week      PT Plan Current plan remains  appropriate    Co-evaluation              AM-PAC PT "6 Clicks" Mobility   Outcome Measure  Help needed turning from your back to your side while in a flat bed without using bedrails?: A Lot Help needed moving from lying on your back to sitting on the side of a flat bed without using bedrails?: A Lot Help needed moving to and from a bed to a chair (including a wheelchair)?: A Little Help needed standing up from a chair using your arms (e.g., wheelchair or bedside chair)?: A Little Help needed to walk in hospital room?: A Little Help needed climbing 3-5 steps with a railing? : A Little 6 Click Score: 16    End of Session  Equipment Utilized During Treatment: Gait belt Activity Tolerance: Patient tolerated treatment well Patient left: in chair;with call bell/phone within reach Nurse Communication: Mobility status PT Visit Diagnosis: History of falling (Z91.81);Unsteadiness on feet (R26.81)     Time: [id number] PT Time Calculation (min) (ACUTE ONLY): 23 min  Charges:  $Gait Training: 8-22 mins $Therapeutic Exercise: 8-22 mins                     Erasmo Leventhal , PTA Acute Rehabilitation Services Pager [phone] Office [phone]     Kasidy Gianino Eli Hose 07/04/2019, 2:35 PM

## 2019-07-04 NOTE — Plan of Care (Signed)
  Problem: Education: Goal: Knowledge of General Education information will improve Description: Including pain rating scale, medication(s)/side effects and non-pharmacologic comfort measures Outcome: Progressing   Problem: Health Behavior/Discharge Planning: Goal: Ability to manage health-related needs will improve Outcome: Progressing   Problem: Clinical Measurements: Goal: Will remain free from infection Outcome: Progressing Goal: Cardiovascular complication will be avoided Outcome: Progressing   Problem: Nutrition: Goal: Adequate nutrition will be maintained Outcome: Progressing   Problem: Pain Managment: Goal: General experience of comfort will improve Outcome: Progressing   Problem: Safety: Goal: Ability to remain free from injury will improve Outcome: Progressing   Problem: Skin Integrity: Goal: Risk for impaired skin integrity will decrease Outcome: Progressing   

## 2019-07-04 NOTE — Progress Notes (Signed)
ANTICOAGULATION CONSULT NOTE - Initial Consult  Pharmacy Consult for warfarin Indication: atrial fibrillation  No Known Allergies  Patient Measurements: Height: 5' 9.02" (175.3 cm) Weight: 65.8 kg (145 lb) IBW/kg (Calculated) : 70.74  Vital Signs: Temp: 98.8 F (37.1 C) (04/19 0754) Temp Source: Oral (04/19 0754) BP: 129/57 (04/19 0754) Pulse Rate: 61 (04/19 0754)  Labs: Recent Labs    07/02/19 0501 07/02/19 0501 07/03/19 0155 07/04/19 0504  HGB 11.9*   < > 10.6* 9.8*  HCT 35.7*  --  31.1* 28.6*  PLT 208  --  187 206  LABPROT 17.1*  --  16.1* 19.1*  INR 1.4*  --  1.3* 1.6*  CREATININE 1.19  --  1.25* 1.13   < > = values in this interval not displayed.    Estimated Creatinine Clearance: 36.4 mL/min (by C-G formula based on SCr of 1.13 mg/dL).   Medical History: Past Medical History:  Diagnosis Date  . Atrial fibrillation ([place])   . Hearing loss   . Hypertension     Medications:  Medications Prior to Admission  Medication Sig Dispense Refill Last Dose  . amLODipine (NORVASC) 5 MG tablet Take 5 mg by mouth daily.   06/30/2019 at Unknown time  . hydrochlorothiazide (HYDRODIURIL) 25 MG tablet Take 25 mg by mouth daily.   06/30/2019 at Unknown time  . telmisartan (MICARDIS) 80 MG tablet Take 80 mg by mouth daily.   06/30/2019 at Unknown time  . warfarin (COUMADIN) 2 MG tablet Take 2 mg by mouth See admin instructions. 1mg  daily, except on Mondays - take 2mg  as directed.   Past Week at Unknown time    Assessment: Pharmacy consulted to continue PTA warfarin following surgery for left hip fracture.  PTA warfarin regimen is 1 mg daily except 2 mg on Mondays. Noted to be hemodynamically stable in PACU and not indicated to delay start of pharmacologic VTE agent due to surgical blood loss or risk of bleeding.   Vitamin K 5 mg IV given on 07/01/19 and vitamin K 10 mg IV given on 06/30/19 preoperatively.   Patient's hemoglobin has trended down since surgery. INR increased by  0.3 following yesterday's warfarin 3 mg dose INR continues to be < 2 and likely due to vitamin K still on board. Patient is eating 100% of meals. Patient is currently on SCDs.  Goal of Therapy:  Goal INR: 2-3 Monitor platelets by anticoagulation protocol: Yes   Plan:  Repeat Warfarin 3mg  po x 1 today, follow-up INR in AM.  Monitor daily INR for warfarin dose, CBC, s/s bleeding  Thank you for the interesting consult and for involving pharmacy in this patient's care.  Shela Commons, PharmD, BCPS 07/04/19 9:00 AM  Please check [email or web address] for unit-specific pharmacist phone numbers

## 2019-07-04 NOTE — Progress Notes (Signed)
PROGRESS NOTE  Manuel Lewis [id number] DOB: 04/13/1921 DOA: 06/30/2019 PCP: [name], MD  HPI/Recap of past 24 hours: HPI from Dr [name] is a 86 y.o. male with history of A. fib, complete heart block status post pacemaker placement, hypertension who was walking in the parking lot along with his son and bumped on him and fell and hurt his left hip area.  Denies having hit his head.  Has been having some subconjunctival hemorrhage over the last few days after his Coumadin levels were elevated. In the ED, X-ray showed left hip fracture.  CT head and C-spine were done which shows chronic fracture of the nasal bone.  Orthopedic surgeon Dr. [name] was consulted. Vitamin K 10 mg IV was given as INR elevated to 3. Covid test was negative.  EKG shows normal sinus rhythm with IVCD. Pt admitted for further management.    Today, denies any new complaints.    Assessment/Plan: Principal Problem:   Closed left hip fracture, initial encounter [facility] Active Problems:   Atrial fibrillation [facility])   Pacemaker   Essential hypertension   Normocytic normochromic anemia   Hip fracture (HCC)   Left hip fracture status post mechanical fall s/p total hip arthroplasty by Dr. [name] on 07/01/2019 Orthopedic surgeon Dr. [name] on board CT head/neck showed chronic nasal bone fracture, nothing acute Management by orthopedics PT/OT  History of chronic A. Fib Heart rate controlled Restart Coumadin  Subconjunctival hemorrhage of left eye Present before the fall Monitor closely  Hypokalemia Replace as needed  Hypertension Hydralazine IV as needed Restart amlodipine, continue to hold telmisartan for now, plan to DC HCTZ due to advanced age  History of complete heart block Status post pacemaker placement      Malnutrition Type:  Nutrition Problem: Increased nutrient needs Etiology: post-op healing   Malnutrition Characteristics:  Signs/Symptoms: estimated  needs   Nutrition Interventions:  Interventions: Ensure Enlive (each supplement provides 350kcal and 20 grams of protein), MVI    Estimated body mass index is 21.4 kg/m as calculated from the following:   Height as of this encounter: 5' 9.02" (1.753 m).   Weight as of this encounter: 65.8 kg.     Code Status: Full  Family Communication: Discussed extensively with patient and son (Dr. [name] of plastic surgery) at bedside on 07/02/2019  Disposition Plan: Patient lives alone at home, PT/OT recommended 24-hour care.  Family looking into providing 24-hour care with a primary agency versus SNF if unable to get 24-hour care.  TOC aware.   Consultants:  Orthopedics  Procedures:  Left total hip arthroplasty  Antimicrobials:  None  DVT prophylaxis: Coumadin   Objective: Vitals:   07/03/19 1933 07/04/19 0340 07/04/19 0754 07/04/19 1537  BP: 127/62 114/70 (!) 129/57 113/61  Pulse: (!) 59 60 61 62  Resp: 17 15 17 18   Temp: 99 F (37.2 C) 98.7 F (37.1 C) 98.8 F (37.1 C) 97.8 F (36.6 C)  TempSrc: Oral Oral Oral Oral  SpO2: 96% 93% 96% 98%  Weight:      Height:        Intake/Output Summary (Last 24 hours) at 07/04/2019 1626 Last data filed at 07/04/2019 1404 Gross per 24 hour  Intake 720 ml  Output 600 ml  Net 120 ml   Filed Weights   06/30/19 1604 07/01/19 1403  Weight: 65.8 kg 65.8 kg    Exam:  General: NAD, subconjunctival hemorrhage of the left eye, alert, awake, oriented x4, hard of hearing  Cardiovascular: S1, S2 present  Respiratory: CTAB  Abdomen: Soft, nontender, nondistended, bowel sounds present  Musculoskeletal: No bilateral pedal edema noted, incision on left thigh area C/D/I  Skin: Normal  Psychiatry: Normal mood   Data Reviewed: CBC: Recent Labs  Lab 06/30/19 1630 06/30/19 1630 06/30/19 1645 07/01/19 0744 07/02/19 0501 07/03/19 0155 07/04/19 0504  WBC 4.9  --   --  8.8 10.3 10.3 8.1  NEUTROABS  --   --   --   --  8.6* 7.9*  6.1  HGB 12.4*   < > 12.2* 12.3* 11.9* 10.6* 9.8*  HCT 36.6*   < > 36.0* 36.1* 35.7* 31.1* 28.6*  MCV 97.6  --   --  96.3 98.6 97.8 98.6  PLT 302  --   --  235 208 187 206   < > = values in this interval not displayed.   Basic Metabolic Panel: Recent Labs  Lab 06/30/19 1819 07/01/19 0744 07/02/19 0501 07/03/19 0155 07/04/19 0504  NA 138 136 137 132* 134*  K 3.4* 3.4* 3.8 3.5 3.4*  CL 104 103 104 102 102  CO2 23 23 22  21* 21*  GLUCOSE 106* 124* 146* 122* 116*  BUN 25* 26* 25* 33* 39*  CREATININE 0.92 0.88 1.19 1.25* 1.13  CALCIUM 9.4 9.0 8.6* 8.4* 8.3*  MG  --  1.8  --   --   --    GFR: Estimated Creatinine Clearance: 36.4 mL/min (by C-G formula based on SCr of 1.13 mg/dL). Liver Function Tests: Recent Labs  Lab 06/30/19 1819  AST 29  ALT 22  ALKPHOS 44  BILITOT 1.5*  PROT 6.5  ALBUMIN 4.1   No results for input(s): LIPASE, AMYLASE in the last 168 hours. No results for input(s): AMMONIA in the last 168 hours. Coagulation Profile: Recent Labs  Lab 07/01/19 0456 07/01/19 1110 07/02/19 0501 07/03/19 0155 07/04/19 0504  INR 1.7* 1.4* 1.4* 1.3* 1.6*   Cardiac Enzymes: No results for input(s): CKTOTAL, CKMB, CKMBINDEX, TROPONINI in the last 168 hours. BNP (last 3 results) No results for input(s): PROBNP in the last 8760 hours. HbA1C: No results for input(s): HGBA1C in the last 72 hours. CBG: No results for input(s): GLUCAP in the last 168 hours. Lipid Profile: No results for input(s): CHOL, HDL, LDLCALC, TRIG, CHOLHDL, LDLDIRECT in the last 72 hours. Thyroid Function Tests: No results for input(s): TSH, T4TOTAL, FREET4, T3FREE, THYROIDAB in the last 72 hours. Anemia Panel: No results for input(s): VITAMINB12, FOLATE, FERRITIN, TIBC, IRON, RETICCTPCT in the last 72 hours. Urine analysis:    Component Value Date/Time   COLORURINE YELLOW 06/30/2019 [place] 06/30/2019 1613   LABSPEC 1.012 06/30/2019 1613   PHURINE 7.0 06/30/2019 1613    GLUCOSEU NEGATIVE 06/30/2019 1613   HGBUR NEGATIVE 06/30/2019 1613   BILIRUBINUR NEGATIVE 06/30/2019 1613   KETONESUR NEGATIVE 06/30/2019 1613   PROTEINUR 100 (A) 06/30/2019 1613   NITRITE NEGATIVE 06/30/2019 1613   LEUKOCYTESUR NEGATIVE 06/30/2019 1613   Sepsis Labs: @LABRCNTIP (procalcitonin:4,lacticidven:4)  ) Recent Results (from the past 240 hour(s))  SARS CORONAVIRUS 2 (TAT 6-24 HRS) Nasopharyngeal Nasopharyngeal Swab     Status: None   Collection Time: 06/30/19  5:46 PM   Specimen: Nasopharyngeal Swab  Result Value Ref Range Status   SARS Coronavirus 2 NEGATIVE NEGATIVE Final    Comment: (NOTE) SARS-CoV-2 target nucleic acids are NOT DETECTED. The SARS-CoV-2 RNA is generally detectable in upper and lower respiratory specimens during the acute phase of infection. Negative results do  not preclude SARS-CoV-2 infection, do not rule out co-infections with other pathogens, and should not be used as the sole basis for treatment or other patient management decisions. Negative results must be combined with clinical observations, patient history, and epidemiological information. The expected result is Negative. Fact Sheet for Patients: SugarRoll.be Fact Sheet for Healthcare Providers: https://www.woods-mathews.com/ This test is not yet approved or cleared by the [place] FDA and  has been authorized for detection and/or diagnosis of SARS-CoV-2 by FDA under an Emergency Use Authorization (EUA). This EUA will remain  in effect (meaning this test can be used) for the duration of the COVID-19 declaration under Section 56 4(b)(1) of the Act, 21 U.S.C. section 360bbb-3(b)(1), unless the authorization is terminated or revoked sooner. Performed at [facility], [place] [address] [place], [place] [address]   Surgical pcr screen     Status: None   Collection Time: 06/30/19  9:54 PM   Specimen: Nasal Mucosa; Nasal Swab  Result Value Ref  Range Status   MRSA, PCR NEGATIVE NEGATIVE Final   Staphylococcus aureus NEGATIVE NEGATIVE Final    Comment: (NOTE) The Xpert SA Assay (FDA approved for NASAL specimens in patients 90 years of age and older), is one component of a comprehensive surveillance program. It is not intended to diagnose infection nor to guide or monitor treatment. Performed at [facility], [place] [address], [place], [place] [address]       Studies: No results found.  Scheduled Meds: . amLODipine  5 mg Oral Daily  . docusate sodium  100 mg Oral BID  . feeding supplement (ENSURE ENLIVE)  237 mL Oral Q24H  . multivitamin with minerals  1 tablet Oral Daily  . pantoprazole  40 mg Oral Daily  . tamsulosin  0.4 mg Oral Daily  . Warfarin - Pharmacist Dosing Inpatient   Does not apply q1600    Continuous Infusions: . methocarbamol (ROBAXIN) IV       LOS: 4 days     Alma Friendly, MD [facility]  If 7PM-7AM, please contact night-coverage [email or web address] 07/04/2019, 4:26 PM

## 2019-07-05 LAB — CBC WITH DIFFERENTIAL/PLATELET
Abs Immature Granulocytes: 0.03 10*3/uL (ref 0.00–0.07)
Basophils Absolute: 0 10*3/uL (ref 0.0–0.1)
Basophils Relative: 0 %
Eosinophils Absolute: 0.3 10*3/uL (ref 0.0–0.5)
Eosinophils Relative: 4 %
HCT: 28.6 % — ABNORMAL LOW (ref 39.0–52.0)
Hemoglobin: 9.7 g/dL — ABNORMAL LOW (ref 13.0–17.0)
Immature Granulocytes: 0 %
Lymphocytes Relative: 14 %
Lymphs Abs: 1 10*3/uL (ref 0.7–4.0)
MCH: 32.8 pg (ref 26.0–34.0)
MCHC: 33.9 g/dL (ref 30.0–36.0)
MCV: 96.6 fL (ref 80.0–100.0)
Monocytes Absolute: 0.9 10*3/uL (ref 0.1–1.0)
Monocytes Relative: 12 %
Neutro Abs: 4.8 10*3/uL (ref 1.7–7.7)
Neutrophils Relative %: 70 %
Platelets: 222 10*3/uL (ref 150–400)
RBC: 2.96 MIL/uL — ABNORMAL LOW (ref 4.22–5.81)
RDW: 13.1 % (ref 11.5–15.5)
WBC: 7 10*3/uL (ref 4.0–10.5)
nRBC: 0 % (ref 0.0–0.2)

## 2019-07-05 LAB — BASIC METABOLIC PANEL
Anion gap: 9 (ref 5–15)
BUN: 39 mg/dL — ABNORMAL HIGH (ref 8–23)
CO2: 22 mmol/L (ref 22–32)
Calcium: 7.9 mg/dL — ABNORMAL LOW (ref 8.9–10.3)
Chloride: 101 mmol/L (ref 98–111)
Creatinine, Ser: 1.14 mg/dL (ref 0.61–1.24)
GFR calc Af Amer: >60 mL/min (ref >60)
GFR calc non Af Amer: 54 mL/min — ABNORMAL LOW (ref >60)
Glucose, Bld: 123 mg/dL — ABNORMAL HIGH (ref 70–99)
Potassium: 3.6 mmol/L (ref 3.5–5.1)
Sodium: 132 mmol/L — ABNORMAL LOW (ref 135–145)

## 2019-07-05 LAB — PROTIME-INR
INR: 2.1 — ABNORMAL HIGH (ref 0.8–1.2)
Prothrombin Time: 23.9 seconds — ABNORMAL HIGH (ref 11.4–15.2)

## 2019-07-05 MED ORDER — WARFARIN SODIUM 1 MG PO TABS
1.0000 mg | ORAL_TABLET | Freq: Once | ORAL | Status: AC
  Administered 2019-07-05: 1 mg via ORAL
  Filled 2019-07-05: qty 1

## 2019-07-05 NOTE — Plan of Care (Signed)
  Problem: Pain Managment: Goal: General experience of comfort will improve Outcome: Progressing   Problem: Safety: Goal: Ability to remain free from injury will improve Outcome: Progressing   Problem: Skin Integrity: Goal: Risk for impaired skin integrity will decrease Outcome: Progressing   

## 2019-07-05 NOTE — Progress Notes (Signed)
Physical Therapy Treatment Patient Details Name: Manuel Lewis MRN: [id number] DOB: [birth year] Today's Date: 07/05/2019    History of Present Illness Manuel Lewis is a 86 y.o. male admitted with left hip fx s/p fall.  Underwent Anterior THR 07/01/19.  PMH significant for hx of A. fib, complete heart block status post pacemaker placement, hypertension    PT Comments    Pt seated in recliner slid forward reporting stiffness and discomfort.  He was very motivated to move and " get going"  He tolerated increased gt distance and negotiated x 4 stairs.  Pt required increased assistance to stand but likley due to stiffness.  Continue to recommend HHPT at d/c with assistance from caregiver.     Follow Up Recommendations  Home health PT     Equipment Recommendations  Rolling walker with 5" wheels;3in1 (PT)    Recommendations for Other Services       Precautions / Restrictions Precautions Precautions: Fall Restrictions Weight Bearing Restrictions: No LLE Weight Bearing: Weight bearing as tolerated    Mobility  Bed Mobility               General bed mobility comments: Pt seated in recliner chair on arrival.  Transfers Overall transfer level: Needs assistance Equipment used: Rolling walker (2 wheeled) Transfers: Sit to/from Stand Sit to Stand: Mod assist         General transfer comment: Increased assistance to come to standing after feeling stiff  Ambulation/Gait Ambulation/Gait assistance: Min guard Gait Distance (Feet): 200 Feet Assistive device: Rolling walker (2 wheeled) Gait Pattern/deviations: Step-through pattern;Trunk flexed Gait velocity: decreased   General Gait Details: Pt required cues for forward gaze and scapular retraction.  Tolerated increased distance.   Stairs Stairs: Yes Stairs assistance: Min guard Stair Management: One rail Left Number of Stairs: 4 General stair comments: Cues for sequencing and hand placement.   Wheelchair Mobility    Modified Rankin (Stroke Patients Only)       Balance Overall balance assessment: Needs assistance Sitting-balance support: No upper extremity supported;Feet supported Sitting balance-Leahy Scale: Good       Standing balance-Leahy Scale: Fair Standing balance comment: reliant on UE support  for balance                            Cognition Arousal/Alertness: Awake/alert Behavior During Therapy: WFL for tasks assessed/performed Overall Cognitive Status: Within Functional Limits for tasks assessed                                        Exercises Total Joint Exercises Ankle Circles/Pumps: AROM;Both;20 reps;Supine Quad Sets: AROM;10 reps;Supine;Left Short Arc Quad: AROM;Left;10 reps;Supine Heel Slides: AAROM;10 reps;Supine;Left Hip ABduction/ADduction: 10 reps;Supine;Left;AROM    General Comments        Pertinent Vitals/Pain Pain Assessment: 0-10 Pain Score: 5  Pain Location: Lt hip  Pain Descriptors / Indicators: Tightness;Sore Pain Intervention(s): Monitored during session;Repositioned    Home Living                      Prior Function            PT Goals (current goals can now be found in the care plan section) Acute Rehab PT Goals Patient Stated Goal: to get back on my feet  Potential to Achieve Goals: Good Progress towards PT goals: Progressing toward goals  Frequency    Min 5X/week      PT Plan Current plan remains appropriate    Co-evaluation              AM-PAC PT "6 Clicks" Mobility   Outcome Measure  Help needed turning from your back to your side while in a flat bed without using bedrails?: A Lot Help needed moving from lying on your back to sitting on the side of a flat bed without using bedrails?: A Lot Help needed moving to and from a bed to a chair (including a wheelchair)?: A Little Help needed standing up from a chair using your arms (e.g., wheelchair or bedside chair)?: A Little Help  needed to walk in hospital room?: A Little Help needed climbing 3-5 steps with a railing? : A Little 6 Click Score: 16    End of Session Equipment Utilized During Treatment: Gait belt Activity Tolerance: Patient tolerated treatment well Patient left: in chair;with call bell/phone within reach;with chair alarm set Nurse Communication: Mobility status PT Visit Diagnosis: History of falling (Z91.81);Unsteadiness on feet (R26.81)     Time: [id number] PT Time Calculation (min) (ACUTE ONLY): 24 min  Charges:  $Gait Training: 8-22 mins $Therapeutic Exercise: 8-22 mins                     Erasmo Leventhal , PTA Acute Rehabilitation Services Pager [phone] Office [phone]     Kainen Struckman Eli Hose 07/05/2019, 3:18 PM

## 2019-07-05 NOTE — Progress Notes (Deleted)
Patient remains in dialysis.

## 2019-07-05 NOTE — Plan of Care (Signed)

## 2019-07-05 NOTE — Progress Notes (Signed)
PROGRESS NOTE  STEFAN MCFEELY [id number] DOB: 19-Feb-1922 DOA: 06/30/2019 PCP: [name], MD  HPI/Recap of past 24 hours: HPI from Dr [name] is a 86 y.o. male with history of A. fib, complete heart block status post pacemaker placement, hypertension who was walking in the parking lot along with his son and bumped on him and fell and hurt his left hip area.  Denies having hit his head.  Has been having some subconjunctival hemorrhage over the last few days after his Coumadin levels were elevated. In the ED, X-ray showed left hip fracture.  CT head and C-spine were done which shows chronic fracture of the nasal bone.  Orthopedic surgeon Dr. [name] was consulted. Vitamin K 10 mg IV was given as INR elevated to 3. Covid test was negative.  EKG shows normal sinus rhythm with IVCD. Pt admitted for further management.    Today, pt denies any new complaints. Hopeful to be discharged home.    Assessment/Plan: Principal Problem:   Closed left hip fracture, initial encounter [facility]) Active Problems:   Atrial fibrillation [facility])   Pacemaker   Essential hypertension   Normocytic normochromic anemia   Hip fracture (HCC)   Left hip fracture status post mechanical fall s/p total hip arthroplasty by Dr. [name] on 07/01/2019 Orthopedic surgeon Dr. [name] on board CT head/neck showed chronic nasal bone fracture, nothing acute Management by orthopedics PT/OT  History of chronic A. Fib Heart rate controlled Restart Coumadin  Subconjunctival hemorrhage of left eye Present before the fall Monitor closely  Hypokalemia Replace as needed  Hypertension Hydralazine IV as needed Restart amlodipine, continue to hold telmisartan for now, plan to DC HCTZ upon discharge due to advanced age  History of complete heart block Status post pacemaker placement      Malnutrition Type:  Nutrition Problem: Increased nutrient needs Etiology: post-op healing   Malnutrition  Characteristics:  Signs/Symptoms: estimated needs   Nutrition Interventions:  Interventions: Ensure Enlive (each supplement provides 350kcal and 20 grams of protein), MVI    Estimated body mass index is 21.4 kg/m as calculated from the following:   Height as of this encounter: 5' 9.02" (1.753 m).   Weight as of this encounter: 65.8 kg.     Code Status: Full  Family Communication: Discussed extensively with patient and son (Dr. [name] of plastic surgery) at bedside on 07/02/2019  Disposition Plan: Patient lives alone at home, PT/OT recommended 24-hour care.  Family found agency for 24-hour care, starting 07/06/19. Plan to d/c home on 07/06/19  Consultants:  Orthopedics  Procedures:  Left total hip arthroplasty  Antimicrobials:  None  DVT prophylaxis: Coumadin   Objective: Vitals:   07/05/19 0348 07/05/19 0824 07/05/19 1002 07/05/19 1213  BP: 126/70 (!) 158/72 (!) 158/72 123/60  Pulse: 64 62  66  Resp: 17 16  16   Temp: 98.2 F (36.8 C) 97.9 F (36.6 C)  98.1 F (36.7 C)  TempSrc: Oral Oral  Oral  SpO2: 97% 95%  100%  Weight:      Height:        Intake/Output Summary (Last 24 hours) at 07/05/2019 1648 Last data filed at 07/05/2019 1614 Gross per 24 hour  Intake 480 ml  Output 725 ml  Net -245 ml   Filed Weights   06/30/19 1604 07/01/19 1403  Weight: 65.8 kg 65.8 kg    Exam:  General: NAD, subconjunctival hemorrhage of the left eye, alert, awake, oriented x4, hard of hearing  Cardiovascular: S1, S2  present  Respiratory: CTAB  Abdomen: Soft, nontender, nondistended, bowel sounds present  Musculoskeletal: No bilateral pedal edema noted, incision on left thigh area C/D/I  Skin: Normal  Psychiatry: Normal mood   Data Reviewed: CBC: Recent Labs  Lab 07/01/19 0744 07/02/19 0501 07/03/19 0155 07/04/19 0504 07/05/19 0227  WBC 8.8 10.3 10.3 8.1 7.0  NEUTROABS  --  8.6* 7.9* 6.1 4.8  HGB 12.3* 11.9* 10.6* 9.8* 9.7*  HCT 36.1* 35.7* 31.1*  28.6* 28.6*  MCV 96.3 98.6 97.8 98.6 96.6  PLT 235 208 187 206 [id number]   Basic Metabolic Panel: Recent Labs  Lab 07/01/19 0744 07/02/19 0501 07/03/19 0155 07/04/19 0504 07/05/19 0227  NA 136 137 132* 134* 132*  K 3.4* 3.8 3.5 3.4* 3.6  CL 103 104 102 102 101  CO2 23 22 21* 21* 22  GLUCOSE 124* 146* 122* 116* 123*  BUN 26* 25* 33* 39* 39*  CREATININE 0.88 1.19 1.25* 1.13 1.14  CALCIUM 9.0 8.6* 8.4* 8.3* 7.9*  MG 1.8  --   --   --   --    GFR: Estimated Creatinine Clearance: 36.1 mL/min (by C-G formula based on SCr of 1.14 mg/dL). Liver Function Tests: Recent Labs  Lab 06/30/19 1819  AST 29  ALT 22  ALKPHOS 44  BILITOT 1.5*  PROT 6.5  ALBUMIN 4.1   No results for input(s): LIPASE, AMYLASE in the last 168 hours. No results for input(s): AMMONIA in the last 168 hours. Coagulation Profile: Recent Labs  Lab 07/01/19 1110 07/02/19 0501 07/03/19 0155 07/04/19 0504 07/05/19 0227  INR 1.4* 1.4* 1.3* 1.6* 2.1*   Cardiac Enzymes: No results for input(s): CKTOTAL, CKMB, CKMBINDEX, TROPONINI in the last 168 hours. BNP (last 3 results) No results for input(s): PROBNP in the last 8760 hours. HbA1C: No results for input(s): HGBA1C in the last 72 hours. CBG: No results for input(s): GLUCAP in the last 168 hours. Lipid Profile: No results for input(s): CHOL, HDL, LDLCALC, TRIG, CHOLHDL, LDLDIRECT in the last 72 hours. Thyroid Function Tests: No results for input(s): TSH, T4TOTAL, FREET4, T3FREE, THYROIDAB in the last 72 hours. Anemia Panel: No results for input(s): VITAMINB12, FOLATE, FERRITIN, TIBC, IRON, RETICCTPCT in the last 72 hours. Urine analysis:    Component Value Date/Time   COLORURINE YELLOW 06/30/2019 [place] 06/30/2019 1613   LABSPEC 1.012 06/30/2019 1613   PHURINE 7.0 06/30/2019 1613   GLUCOSEU NEGATIVE 06/30/2019 1613   HGBUR NEGATIVE 06/30/2019 1613   BILIRUBINUR NEGATIVE 06/30/2019 1613   KETONESUR NEGATIVE 06/30/2019 1613    PROTEINUR 100 (A) 06/30/2019 1613   NITRITE NEGATIVE 06/30/2019 1613   LEUKOCYTESUR NEGATIVE 06/30/2019 1613   Sepsis Labs: @LABRCNTIP (procalcitonin:4,lacticidven:4)  ) Recent Results (from the past 240 hour(s))  SARS CORONAVIRUS 2 (TAT 6-24 HRS) Nasopharyngeal Nasopharyngeal Swab     Status: None   Collection Time: 06/30/19  5:46 PM   Specimen: Nasopharyngeal Swab  Result Value Ref Range Status   SARS Coronavirus 2 NEGATIVE NEGATIVE Final    Comment: (NOTE) SARS-CoV-2 target nucleic acids are NOT DETECTED. The SARS-CoV-2 RNA is generally detectable in upper and lower respiratory specimens during the acute phase of infection. Negative results do not preclude SARS-CoV-2 infection, do not rule out co-infections with other pathogens, and should not be used as the sole basis for treatment or other patient management decisions. Negative results must be combined with clinical observations, patient history, and epidemiological information. The expected result is Negative. Fact Sheet for Patients: SugarRoll.be Fact Sheet for  Healthcare Providers: https://www.woods-mathews.com/ This test is not yet approved or cleared by the [place] and  has been authorized for detection and/or diagnosis of SARS-CoV-2 by FDA under an Emergency Use Authorization (EUA). This EUA will remain  in effect (meaning this test can be used) for the duration of the COVID-19 declaration under Section 56 4(b)(1) of the Act, 21 U.S.C. section 360bbb-3(b)(1), unless the authorization is terminated or revoked sooner. Performed at [facility], [place] [address] [place], [place] [address]   Surgical pcr screen     Status: None   Collection Time: 06/30/19  9:54 PM   Specimen: Nasal Mucosa; Nasal Swab  Result Value Ref Range Status   MRSA, PCR NEGATIVE NEGATIVE Final   Staphylococcus aureus NEGATIVE NEGATIVE Final    Comment: (NOTE) The Xpert SA Assay (FDA  approved for NASAL specimens in patients 46 years of age and older), is one component of a comprehensive surveillance program. It is not intended to diagnose infection nor to guide or monitor treatment. Performed at [facility], [place] [address], [place], [place] [address]       Studies: No results found.  Scheduled Meds: . amLODipine  5 mg Oral Daily  . docusate sodium  100 mg Oral BID  . feeding supplement (ENSURE ENLIVE)  237 mL Oral Q24H  . multivitamin with minerals  1 tablet Oral Daily  . pantoprazole  40 mg Oral Daily  . tamsulosin  0.4 mg Oral Daily  . Warfarin - Pharmacist Dosing Inpatient   Does not apply q1600    Continuous Infusions: . methocarbamol (ROBAXIN) IV       LOS: 5 days     Alma Friendly, MD [facility]  If 7PM-7AM, please contact night-coverage [email or web address] 07/05/2019, 4:48 PM

## 2019-07-05 NOTE — Progress Notes (Signed)
ANTICOAGULATION CONSULT NOTE - Initial Consult  Pharmacy Consult for warfarin Indication: atrial fibrillation  No Known Allergies  Patient Measurements: Height: 5' 9.02" (175.3 cm) Weight: 65.8 kg (145 lb) IBW/kg (Calculated) : 70.74  Vital Signs: Temp: 97.9 F (36.6 C) (04/20 0824) Temp Source: Oral (04/20 0824) BP: 158/72 (04/20 0824) Pulse Rate: 62 (04/20 0824)  Labs: Recent Labs    07/03/19 0155 07/03/19 0155 07/04/19 0504 07/05/19 0227  HGB 10.6*   < > 9.8* 9.7*  HCT 31.1*  --  28.6* 28.6*  PLT 187  --  206 222  LABPROT 16.1*  --  19.1* 23.9*  INR 1.3*  --  1.6* 2.1*  CREATININE 1.25*  --  1.13 1.14   < > = values in this interval not displayed.    Estimated Creatinine Clearance: 36.1 mL/min (by C-G formula based on SCr of 1.14 mg/dL).   Medical History: Past Medical History:  Diagnosis Date  . Atrial fibrillation ([place])   . Hearing loss   . Hypertension     Medications:  Medications Prior to Admission  Medication Sig Dispense Refill Last Dose  . amLODipine (NORVASC) 5 MG tablet Take 5 mg by mouth daily.   06/30/2019 at Unknown time  . hydrochlorothiazide (HYDRODIURIL) 25 MG tablet Take 25 mg by mouth daily.   06/30/2019 at Unknown time  . telmisartan (MICARDIS) 80 MG tablet Take 80 mg by mouth daily.   06/30/2019 at Unknown time  . warfarin (COUMADIN) 2 MG tablet Take 2 mg by mouth See admin instructions. 1mg  daily, except on Mondays - take 2mg  as directed.   Past Week at Unknown time    Assessment: Pharmacy consulted to continue PTA warfarin following surgery for left hip fracture.  PTA warfarin regimen is 1 mg daily except 2 mg on Mondays. Noted to be hemodynamically stable in PACU and not indicated to delay start of pharmacologic VTE agent due to surgical blood loss or risk of bleeding.   Vitamin K 5 mg IV given on 07/01/19 and vitamin K 10 mg IV given on 06/30/19 preoperatively.   Patient's hemoglobin has trended down since surgery. INR increased by 0.5  following yesterday's warfarin 3 mg dose INR therapeutic today No significant drug interactions with warfarin  Goal of Therapy:  Goal INR: 2-3 Monitor platelets by anticoagulation protocol: Yes   Plan:  Warfarin 1 mg po x 1 today, follow-up INR in AM.  Monitor daily INR for warfarin dose, CBC, s/s bleeding   Shela Commons, PharmD, BCPS 07/05/19 9:07 AM  Please check [email or web address] for unit-specific pharmacist phone numbers

## 2019-07-06 ENCOUNTER — Encounter: Payer: Self-pay | Admitting: Internal Medicine

## 2019-07-06 DIAGNOSIS — W19XXXA Unspecified fall, initial encounter: Secondary | ICD-10-CM

## 2019-07-06 LAB — BASIC METABOLIC PANEL
Anion gap: 11 (ref 5–15)
BUN: 32 mg/dL — ABNORMAL HIGH (ref 8–23)
CO2: 21 mmol/L — ABNORMAL LOW (ref 22–32)
Calcium: 8 mg/dL — ABNORMAL LOW (ref 8.9–10.3)
Chloride: 103 mmol/L (ref 98–111)
Creatinine, Ser: 0.96 mg/dL (ref 0.61–1.24)
GFR calc Af Amer: >60 mL/min (ref >60)
GFR calc non Af Amer: >60 mL/min (ref >60)
Glucose, Bld: 115 mg/dL — ABNORMAL HIGH (ref 70–99)
Potassium: 3.7 mmol/L (ref 3.5–5.1)
Sodium: 135 mmol/L (ref 135–145)

## 2019-07-06 LAB — CBC WITH DIFFERENTIAL/PLATELET
Abs Immature Granulocytes: 0.04 10*3/uL (ref 0.00–0.07)
Basophils Absolute: 0 10*3/uL (ref 0.0–0.1)
Basophils Relative: 1 %
Eosinophils Absolute: 0.3 10*3/uL (ref 0.0–0.5)
Eosinophils Relative: 5 %
HCT: 28.7 % — ABNORMAL LOW (ref 39.0–52.0)
Hemoglobin: 9.6 g/dL — ABNORMAL LOW (ref 13.0–17.0)
Immature Granulocytes: 1 %
Lymphocytes Relative: 18 %
Lymphs Abs: 1 10*3/uL (ref 0.7–4.0)
MCH: 32.7 pg (ref 26.0–34.0)
MCHC: 33.4 g/dL (ref 30.0–36.0)
MCV: 97.6 fL (ref 80.0–100.0)
Monocytes Absolute: 0.8 10*3/uL (ref 0.1–1.0)
Monocytes Relative: 13 %
Neutro Abs: 3.6 10*3/uL (ref 1.7–7.7)
Neutrophils Relative %: 62 %
Platelets: 220 10*3/uL (ref 150–400)
RBC: 2.94 MIL/uL — ABNORMAL LOW (ref 4.22–5.81)
RDW: 12.8 % (ref 11.5–15.5)
WBC: 5.7 10*3/uL (ref 4.0–10.5)
nRBC: 0 % (ref 0.0–0.2)

## 2019-07-06 LAB — PROTIME-INR
INR: 2.3 — ABNORMAL HIGH (ref 0.8–1.2)
Prothrombin Time: 25.1 seconds — ABNORMAL HIGH (ref 11.4–15.2)

## 2019-07-06 MED ORDER — WARFARIN SODIUM 1 MG PO TABS
1.0000 mg | ORAL_TABLET | ORAL | Status: DC
  Filled 2019-07-06: qty 1

## 2019-07-06 MED ORDER — WARFARIN SODIUM 2 MG PO TABS: 2.0000 mg | ORAL_TABLET | ORAL | Status: DC

## 2019-07-06 NOTE — Progress Notes (Signed)
Occupational Therapy Treatment Patient Details Name: Manuel Lewis MRN: [id number] DOB: [birth year] Today's Date: 07/06/2019    History of present illness Manuel Lewis is a 86 y.o. male admitted with left hip fx s/p fall.  Underwent Anterior THR 07/01/19.  PMH significant for hx of A. fib, complete heart block status post pacemaker placement, hypertension   OT comments  Met pt ambulating with PT in the hallway. Pt required minA for LB dressing and setupA for UB dressing. Pt's daughter present during session and reported she felt more comfortable with assisting pt. Feel pt is appropriate to d/c home with available support when medically stable. Should pt remain in acute care, will continue to follow acutely.    Follow Up Recommendations  Home health OT;Supervision/Assistance - 24 hour    Equipment Recommendations  3 in 1 bedside commode    Recommendations for Other Services      Precautions / Restrictions Precautions Precautions: Fall Restrictions Weight Bearing Restrictions: No LLE Weight Bearing: Weight bearing as tolerated       Mobility Bed Mobility               General bed mobility comments: pt ambulating with pt in the hallway  Transfers Overall transfer level: Needs assistance Equipment used: Rolling walker (2 wheeled) Transfers: Sit to/from Stand Sit to Stand: Min assist         General transfer comment: minA to powerup into standing and for stability    Balance Overall balance assessment: Needs assistance Sitting-balance support: No upper extremity supported;Feet supported Sitting balance-Leahy Scale: Good     Standing balance support: During functional activity;Single extremity supported Standing balance-Leahy Scale: Fair Standing balance comment: able to static stand to pull pants up                           ADL either performed or assessed with clinical judgement   ADL Overall ADL's : Needs assistance/impaired                  Upper Body Dressing : Set up;Sitting   Lower Body Dressing: Minimal assistance;Sit to/from stand Lower Body Dressing Details (indicate cue type and reason): minA to don over feet, educated about dressing LLE first, minA to pull pants up Toilet Transfer: Ambulation;Comfort height toilet;Grab bars;RW;Minimal assistance   Toileting- Clothing Manipulation and Hygiene: Sit to/from stand;Minimal assistance   Tub/ Shower Transfer: Walk-in shower;Minimal assistance   Functional mobility during ADLs: Min guard;Rolling walker;Minimal assistance General ADL Comments: daughter present, discussed safe and appropriate shower transfers;daughter reported she felt more comfortable with assisting pt     Vision       Perception     Praxis      Cognition Arousal/Alertness: Awake/alert Behavior During Therapy: WFL for tasks assessed/performed Overall Cognitive Status: Within Functional Limits for tasks assessed                                          Exercises     Shoulder Instructions       General Comments      Pertinent Vitals/ Pain       Pain Assessment: No/denies pain Pain Intervention(s): Monitored during session  Home Living Family/patient expects to be discharged to:: Private residence Living Arrangements: Alone Available Help at Discharge: Family;Available PRN/intermittently Type of Home: House Home Access: Stairs to enter Entrance  Stairs-Number of Steps: 4 Entrance Stairs-Rails: Right Home Layout: Two level;Able to live on main level with bedroom/bathroom     Bathroom Shower/Tub: Walk-in shower   Bathroom Toilet: Handicapped height     Home Equipment: Shower seat;Grab bars - tub/shower;Grab bars - toilet          Prior Functioning/Environment Level of Independence: Independent        Comments: still driving.  Enjoys flying, he was a recreational pilot and a flight instructor.  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    Frequency   Min 2X/week        Progress Toward Goals  OT Goals(current goals can now be found in the care plan section)     Acute Rehab OT Goals Patient Stated Goal: to go home today OT Goal Formulation: With patient/family Time For Goal Achievement: 07/10/19 Potential to Achieve Goals: Good  Plan      Co-evaluation                 AM-PAC OT "6 Clicks" Daily Activity     Outcome Measure   Help from another person eating meals?: A Little Help from another person taking care of personal grooming?: A Little Help from another person toileting, which includes using toliet, bedpan, or urinal?: A Little Help from another person bathing (including washing, rinsing, drying)?: A Little Help from another person to put on and taking off regular upper body clothing?: A Little Help from another person to put on and taking off regular lower body clothing?: A Little 6 Click Score: 18    End of Session Equipment Utilized During Treatment: Gait belt;Rolling walker  OT Visit Diagnosis: Unsteadiness on feet (R26.81);Pain Pain - Right/Left: Left Pain - part of body: Hip   Activity Tolerance Patient tolerated treatment well   Patient Left in chair;with call bell/phone within reach;with chair alarm set;with family/visitor present   Nurse Communication Mobility status        Time: 1221-1244 OT Time Calculation (min): 23 min  Charges: OT General Charges $OT Visit: 1 Visit OT Treatments $Self Care/Home Management : 23-37 mins   OTR/L Acute Rehabilitation Services Office: [phone]     J  07/06/2019, 12:58 PM    

## 2019-07-06 NOTE — Progress Notes (Signed)
ANTICOAGULATION CONSULT NOTE - Follow Up Consult  Pharmacy Consult for warfarin Indication: atrial fibrillation  No Known Allergies  Patient Measurements: Height: 5' 9.02" (175.3 cm) Weight: 65.8 kg (145 lb) IBW/kg (Calculated) : 70.74  Vital Signs: Temp: 98.7 F (37.1 C) (04/21 0754) Temp Source: Oral (04/21 0754) BP: 130/69 (04/21 0754) Pulse Rate: 64 (04/21 0754)  Labs: Recent Labs    07/04/19 0504 07/04/19 0504 07/05/19 0227 07/06/19 0416  HGB 9.8*   < > 9.7* 9.6*  HCT 28.6*  --  28.6* 28.7*  PLT 206  --  222 220  LABPROT 19.1*  --  23.9* 25.1*  INR 1.6*  --  2.1* 2.3*  CREATININE 1.13  --  1.14 0.96   < > = values in this interval not displayed.    Estimated Creatinine Clearance: 42.8 mL/min (by C-G formula based on SCr of 0.96 mg/dL).   Medications:  Medications Prior to Admission  Medication Sig Dispense Refill Last Dose  . amLODipine (NORVASC) 5 MG tablet Take 5 mg by mouth daily.   06/30/2019 at Unknown time  . hydrochlorothiazide (HYDRODIURIL) 25 MG tablet Take 25 mg by mouth daily.   06/30/2019 at Unknown time  . telmisartan (MICARDIS) 80 MG tablet Take 80 mg by mouth daily.   06/30/2019 at Unknown time  . warfarin (COUMADIN) 2 MG tablet Take 2 mg by mouth See admin instructions. 1mg  daily, except on Mondays - take 2mg  as directed.   Past Week at Unknown time    Assessment: Pharmacy consulted to continue PTA warfarin following surgery for left hip fracture.  PTA warfarin regimen is 1 mg daily except 2 mg on Mondays. Noted to be hemodynamically stable in PACU and not indicated to delay start of pharmacologic VTE agent due to surgical blood loss or risk of bleeding.   Vitamin K 5 mg IV given on 07/01/19 and vitamin K 10 mg IV given on 06/30/19 preoperatively.   Patient's hemoglobin has trended down since surgery. INR therapeutic today following yesterday's warfarin dose of 1mg  No significant drug interactions with warfarin  Goal of Therapy:  INR  2-3 Monitor platelets by anticoagulation protocol: Yes   Plan:  Resume patient's home regimen of warfarin 1 mg daily except 2 mg on Mondays. Monitor daily INR and adjust warfarin if needed, CBC, s/s bleeding  Efraim Kaufmann, PharmD, BCPS 07/06/2019,8:42 AM

## 2019-07-06 NOTE — Plan of Care (Signed)

## 2019-07-06 NOTE — Discharge Summary (Signed)
Physician Discharge Summary  Manuel Lewis [id number] DOB: 1922/01/09 DOA: 06/30/2019  PCP: [name], MD  Admit date: 06/30/2019 Discharge date: 07/06/2019  Admitted From: Home Disposition:  Home  Recommendations for Outpatient Follow-up:  1. Follow up with PCP in 2-3 2. F/u with Orthopedic Surgery as scheduled  Home Health:PT/OT  Equipment/Devices:3 in 1, Rolling walker    Discharge Condition:Stable CODE STATUS:Full Diet recommendation: Heart healthy   Brief/Interim Summary: 86 y.o.malewithhistory of A. fib, complete heart block status post pacemaker placement, hypertension who was walking in the parking lot along with his son and bumped on him and fell and hurt his left hip area. Denies having hit his head. Has been having some subconjunctival hemorrhage over the last few days after his Coumadin levels were elevated. In the ED, X-ray showed left hip fracture. CT head and C-spine were done which shows chronic fracture of the nasal bone. Orthopedic surgeon Dr. [name] was consulted. Vitamin K 10 mg IV was given as INR elevated to 3. Covid test was negative. EKG shows normal sinus rhythm with IVCD. Pt admitted for further management.  Discharge Diagnoses:  Principal Problem:   Closed left hip fracture, initial encounter [facility]) Active Problems:   Atrial fibrillation [facility])   Pacemaker   Essential hypertension   Normocytic normochromic anemia   Hip fracture (HCC)  Left hip fracture status post mechanical fall s/p total hip arthroplasty by Dr. [name] on 07/01/2019 Orthopedic surgeon Dr. [name] on board CT head/neck showed chronic nasal bone fracture, nothing acute PT/OT with recs for home health PT/OT. Plan to d/c today  History of chronic A. Fib Heart rate controlled Continue Coumadin on d/c  Subconjunctival hemorrhage of left eye Present before the fall Stable  Hypokalemia Replace as needed  Hypertension Hydralazine IV as needed Restart  amlodipine, continue to hold telmisartan for now, have discontinued HCTZ upon discharge due to advanced age  History of complete heart block Status post pacemaker placement Remained stable  Discharge Instructions   Allergies as of 07/06/2019   No Known Allergies     Medication List    STOP taking these medications   hydrochlorothiazide 25 MG tablet Commonly known as: HYDRODIURIL   telmisartan 80 MG tablet Commonly known as: MICARDIS     TAKE these medications   amLODipine 5 MG tablet Commonly known as: NORVASC Take 5 mg by mouth daily.   HYDROcodone-acetaminophen 5-325 MG tablet Commonly known as: NORCO/VICODIN Take 1-2 tablets by mouth every 6 (six) hours as needed for moderate pain (pain score 4-6).   warfarin 2 MG tablet Commonly known as: COUMADIN Take 2 mg by mouth See admin instructions. 1mg  daily, except on Mondays - take 2mg  as directed.            Durable Medical Equipment  (From admission, onward)         Start     Ordered   07/01/19 1755  DME 3 n 1  Once     07/01/19 1754   07/01/19 1755  DME Walker rolling  Once    Question Answer Comment  Walker: With 5 Inch Wheels   Patient needs a walker to treat with the following condition Status post total replacement of left hip      07/01/19 1754         Follow-up Information    Mcarthur Rossetti, MD. Schedule an appointment as soon as possible for a visit in 2 week(s).   Specialty: Orthopedic Surgery Contact information: [place]  [place]        [name], MD Follow up in 3 week(s).   Specialty: Internal Medicine Contact information: [address] [place]  [address] [phone]          No Known Allergies  Consultations:  Orthopedic Surgery  Procedures/Studies: CT HEAD WO CONTRAST  Result Date: 06/30/2019 CLINICAL DATA:  Recent fall with headaches and neck pain, initial encounter EXAM: CT HEAD WITHOUT CONTRAST  CT CERVICAL SPINE WITHOUT CONTRAST TECHNIQUE: Multidetector CT imaging of the head and cervical spine was performed following the standard protocol without intravenous contrast. Multiplanar CT image reconstructions of the cervical spine were also generated. COMPARISON:  None. FINDINGS: CT HEAD FINDINGS Brain: No evidence of acute infarction, hemorrhage, hydrocephalus, extra-axial collection or mass lesion/mass effect. Chronic atrophic and ischemic changes are noted. Vascular: No hyperdense vessel or unexpected calcification. Skull: Normal. Negative for fracture or focal lesion. Sinuses/Orbits: No acute finding. Other: Nasal bone fractures are seen but of uncertain chronicity. Correlation with the physical exam is recommended. CT CERVICAL SPINE FINDINGS Alignment: Within normal limits. Skull base and vertebrae: 7 cervical segments are well visualized. Vertebral body height is well maintained. Multilevel disc space narrowing with osteophytic changes are seen. Multilevel facet hypertrophic changes are noted. No acute fracture or acute facet abnormality is noted. Soft tissues and spinal canal: Surrounding soft tissue structures show no acute abnormality. Vascular calcifications are seen. Scattered small hypodensities are noted within the thyroid measuring less than 1 cm. Upper chest: Visualized lung apices demonstrate emphysematous change. Other: None IMPRESSION: CT of the head: Chronic atrophic and ischemic changes without acute intracranial abnormality Bilateral nasal bone fractures of uncertain chronicity. Correlation with the physical exam is recommended. CT of the cervical spine: Multilevel degenerative change without acute bony abnormality. Scattered small hypodensities within the thyroid gland. Given the patient's age, no follow-up recommended unless clinically warranted (ref: J Am Coll Radiol. 2015 Feb;12(2): 143-50). Electronically Signed   By: Inez Catalina M.D.   On: 06/30/2019 18:20   CT CERVICAL SPINE WO  CONTRAST  Result Date: 06/30/2019 CLINICAL DATA:  Recent fall with headaches and neck pain, initial encounter EXAM: CT HEAD WITHOUT CONTRAST CT CERVICAL SPINE WITHOUT CONTRAST TECHNIQUE: Multidetector CT imaging of the head and cervical spine was performed following the standard protocol without intravenous contrast. Multiplanar CT image reconstructions of the cervical spine were also generated. COMPARISON:  None. FINDINGS: CT HEAD FINDINGS Brain: No evidence of acute infarction, hemorrhage, hydrocephalus, extra-axial collection or mass lesion/mass effect. Chronic atrophic and ischemic changes are noted. Vascular: No hyperdense vessel or unexpected calcification. Skull: Normal. Negative for fracture or focal lesion. Sinuses/Orbits: No acute finding. Other: Nasal bone fractures are seen but of uncertain chronicity. Correlation with the physical exam is recommended. CT CERVICAL SPINE FINDINGS Alignment: Within normal limits. Skull base and vertebrae: 7 cervical segments are well visualized. Vertebral body height is well maintained. Multilevel disc space narrowing with osteophytic changes are seen. Multilevel facet hypertrophic changes are noted. No acute fracture or acute facet abnormality is noted. Soft tissues and spinal canal: Surrounding soft tissue structures show no acute abnormality. Vascular calcifications are seen. Scattered small hypodensities are noted within the thyroid measuring less than 1 cm. Upper chest: Visualized lung apices demonstrate emphysematous change. Other: None IMPRESSION: CT of the head: Chronic atrophic and ischemic changes without acute intracranial abnormality Bilateral nasal bone fractures of uncertain chronicity. Correlation with the physical exam is recommended. CT of the cervical spine: Multilevel degenerative change without acute bony  abnormality. Scattered small hypodensities within the thyroid gland. Given the patient's age, no follow-up recommended unless clinically warranted  (ref: J Am Coll Radiol. 2015 Feb;12(2): 143-50). Electronically Signed   By: Inez Catalina M.D.   On: 06/30/2019 18:20   DG Pelvis Portable  Result Date: 07/01/2019 CLINICAL DATA:  Status post hip replacement. EXAM: PORTABLE PELVIS 1-2 VIEWS COMPARISON:  C-arm images obtained earlier today. 06/30/2019. FINDINGS: Bipolar left hip prosthesis in satisfactory position and alignment. No fracture or dislocation seen. Multiple prostate radiation seed implants. Lumbosacral spine degenerative changes. IMPRESSION: Satisfactory postoperative appearance of a left hip prosthesis. Electronically Signed   By: Claudie Revering M.D.   On: 07/01/2019 19:21   DG Pelvis Portable  Result Date: 06/30/2019 CLINICAL DATA:  86 year old who fell earlier today. Patient is currently anticoagulated. EXAM: PORTABLE PELVIS 1-2 VIEWS COMPARISON:  Bone window images from CT abdomen and pelvis 05/19/2016. FINDINGS: Foreshortening of the LEFT femoral neck, either due to fracture or patient position. No fractures elsewhere. Hip joints anatomically aligned with well-preserved joint spaces for patient age. Sacroiliac joints and symphysis pubis anatomically aligned without diastasis. Degenerative changes involving the sacroiliac joints and the visualized lower lumbar spine, unchanged since the prior CT. Brachytherapy seeds in the prostate gland. IMPRESSION: Foreshortening of the LEFT femoral neck, either due to fracture or patient position. Dedicated LEFT hip x-rays are recommended. Electronically Signed   By: Evangeline Dakin M.D.   On: 06/30/2019 16:22   DG Chest Port 1 View  Result Date: 06/30/2019 CLINICAL DATA:  86 year old who fell earlier today. Patient is currently anticoagulated. EXAM: PORTABLE CHEST 1 VIEW COMPARISON:  05/16/2016 and earlier. FINDINGS: Cardiac silhouette moderately to markedly enlarged for AP portable technique, increased in size since 2018. Thoracic aorta mildly atherosclerotic, unchanged. RIGHT subclavian single lead  transvenous pacemaker, unchanged. Lungs clear. Bronchovascular markings normal. Pulmonary vascularity normal. No visible pleural effusions. No pneumothorax. IMPRESSION: 1. Moderate to marked cardiomegaly, increased in size since 2018. 2. No acute cardiopulmonary disease. Electronically Signed   By: Evangeline Dakin M.D.   On: 06/30/2019 16:19   DG C-Arm 1-60 Min  Result Date: 07/01/2019 CLINICAL DATA:  Left femoral fracture EXAM: DG C-ARM 1-60 MIN; OPERATIVE LEFT HIP WITH PELVIS COMPARISON:  None. FLUOROSCOPY TIME:  Fluoroscopy Time:  26 Radiation Exposure Index (if provided by the fluoroscopic device): Not available Number of Acquired Spot Images: 5 FINDINGS: Initial images demonstrate a left femoral neck fracture. Subsequent images demonstrate left hip replacement in satisfactory position. No acute bony abnormality is noted. Prostate seeds are noted. IMPRESSION: Status post left hip replacement Electronically Signed   By: Inez Catalina M.D.   On: 07/01/2019 17:55   DG HIP OPERATIVE UNILAT W OR W/O PELVIS LEFT  Result Date: 07/01/2019 CLINICAL DATA:  Left femoral fracture EXAM: DG C-ARM 1-60 MIN; OPERATIVE LEFT HIP WITH PELVIS COMPARISON:  None. FLUOROSCOPY TIME:  Fluoroscopy Time:  26 Radiation Exposure Index (if provided by the fluoroscopic device): Not available Number of Acquired Spot Images: 5 FINDINGS: Initial images demonstrate a left femoral neck fracture. Subsequent images demonstrate left hip replacement in satisfactory position. No acute bony abnormality is noted. Prostate seeds are noted. IMPRESSION: Status post left hip replacement Electronically Signed   By: Inez Catalina M.D.   On: 07/01/2019 17:55   DG Hip Unilat W or Wo Pelvis 2-3 Views Left  Result Date: 06/30/2019 CLINICAL DATA:  86 year old who fell and has LEFT hip pain. EXAM: DG HIP (WITH OR WITHOUT PELVIS) 2-3V LEFT COMPARISON:  No  prior LEFT hip x-rays. Bone window images from CT abdomen and pelvis 05/19/2016. FINDINGS: Acute  subcapital or basicervical LEFT femoral neck fracture. Hip joint anatomically aligned with well-preserved joint space. Bone mineral density well preserved for patient age. IMPRESSION: Acute subcapital or basicervical LEFT femoral neck fracture. Electronically Signed   By: Evangeline Dakin M.D.   On: 06/30/2019 16:49     Subjective: Eager to go home  Discharge Exam: Vitals:   07/06/19 0321 07/06/19 0754  BP: 127/76 130/69  Pulse: 63 64  Resp: 18 17  Temp: 98.3 F (36.8 C) 98.7 F (37.1 C)  SpO2: 97% 100%   Vitals:   07/05/19 1739 07/05/19 1932 07/06/19 0321 07/06/19 0754  BP: 129/61 140/75 127/76 130/69  Pulse: 67 62 63 64  Resp: 16 16 18 17   Temp: 97.8 F (36.6 C) 98.3 F (36.8 C) 98.3 F (36.8 C) 98.7 F (37.1 C)  TempSrc: Oral Oral Oral Oral  SpO2: 91% 100% 97% 100%  Weight:      Height:        General: Pt is alert, awake, not in acute distress Cardiovascular: RRR, S1/S2 +, no rubs, no gallops Respiratory: CTA bilaterally, no wheezing, no rhonchi Abdominal: Soft, NT, ND, bowel sounds + Extremities: no edema, no cyanosis   The results of significant diagnostics from this hospitalization (including imaging, microbiology, ancillary and laboratory) are listed below for reference.     Microbiology: Recent Results (from the past 240 hour(s))  SARS CORONAVIRUS 2 (TAT 6-24 HRS) Nasopharyngeal Nasopharyngeal Swab     Status: None   Collection Time: 06/30/19  5:46 PM   Specimen: Nasopharyngeal Swab  Result Value Ref Range Status   SARS Coronavirus 2 NEGATIVE NEGATIVE Final    Comment: (NOTE) SARS-CoV-2 target nucleic acids are NOT DETECTED. The SARS-CoV-2 RNA is generally detectable in upper and lower respiratory specimens during the acute phase of infection. Negative results do not preclude SARS-CoV-2 infection, do not rule out co-infections with other pathogens, and should not be used as the sole basis for treatment or other patient management decisions. Negative  results must be combined with clinical observations, patient history, and epidemiological information. The expected result is Negative. Fact Sheet for Patients: SugarRoll.be Fact Sheet for Healthcare Providers: https://www.woods-mathews.com/ This test is not yet approved or cleared by the [place] FDA and  has been authorized for detection and/or diagnosis of SARS-CoV-2 by FDA under an Emergency Use Authorization (EUA). This EUA will remain  in effect (meaning this test can be used) for the duration of the COVID-19 declaration under Section 56 4(b)(1) of the Act, 21 U.S.C. section 360bbb-3(b)(1), unless the authorization is terminated or revoked sooner. Performed at [facility], [place] [address], [place], [place] [address]   Surgical pcr screen     Status: None   Collection Time: 06/30/19  9:54 PM   Specimen: Nasal Mucosa; Nasal Swab  Result Value Ref Range Status   MRSA, PCR NEGATIVE NEGATIVE Final   Staphylococcus aureus NEGATIVE NEGATIVE Final    Comment: (NOTE) The Xpert SA Assay (FDA approved for NASAL specimens in patients 8 years of age and older), is one component of a comprehensive surveillance program. It is not intended to diagnose infection nor to guide or monitor treatment. Performed at [facility], [place] [address], [place],  [address]      Labs: BNP (last 3 results) No results for input(s): BNP in the last 8760 hours. Basic Metabolic Panel: Recent Labs  Lab 07/01/19  [id number] 07/01/19 0744 07/02/19 0501 07/03/19 0155 07/04/19 0504 07/05/19 0227 07/06/19 0416  NA 136   < > 137 132* 134* 132* 135  K 3.4*   < > 3.8 3.5 3.4* 3.6 3.7  CL 103   < > 104 102 102 101 103  CO2 23   < > 22 21* 21* 22 21*  GLUCOSE 124*   < > 146* 122* 116* 123* 115*  BUN 26*   < > 25* 33* 39* 39* 32*  CREATININE 0.88   < > 1.19 1.25* 1.13 1.14 0.96  CALCIUM 9.0   < > 8.6* 8.4* 8.3* 7.9* 8.0*  MG 1.8  --   --   --   --    --   --    < > = values in this interval not displayed.   Liver Function Tests: Recent Labs  Lab 06/30/19 1819  AST 29  ALT 22  ALKPHOS 44  BILITOT 1.5*  PROT 6.5  ALBUMIN 4.1   No results for input(s): LIPASE, AMYLASE in the last 168 hours. No results for input(s): AMMONIA in the last 168 hours. CBC: Recent Labs  Lab 07/02/19 0501 07/03/19 0155 07/04/19 0504 07/05/19 0227 07/06/19 0416  WBC 10.3 10.3 8.1 7.0 5.7  NEUTROABS 8.6* 7.9* 6.1 4.8 3.6  HGB 11.9* 10.6* 9.8* 9.7* 9.6*  HCT 35.7* 31.1* 28.6* 28.6* 28.7*  MCV 98.6 97.8 98.6 96.6 97.6  PLT 208 187 206 222 220   Cardiac Enzymes: No results for input(s): CKTOTAL, CKMB, CKMBINDEX, TROPONINI in the last 168 hours. BNP: Invalid input(s): POCBNP CBG: No results for input(s): GLUCAP in the last 168 hours. D-Dimer No results for input(s): DDIMER in the last 72 hours. Hgb A1c No results for input(s): HGBA1C in the last 72 hours. Lipid Profile No results for input(s): CHOL, HDL, LDLCALC, TRIG, CHOLHDL, LDLDIRECT in the last 72 hours. Thyroid function studies No results for input(s): TSH, T4TOTAL, T3FREE, THYROIDAB in the last 72 hours.  Invalid input(s): FREET3 Anemia work up No results for input(s): VITAMINB12, FOLATE, FERRITIN, TIBC, IRON, RETICCTPCT in the last 72 hours. Urinalysis    Component Value Date/Time   COLORURINE YELLOW 06/30/2019 1613   APPEARANCEUR CLEAR 06/30/2019 1613   LABSPEC 1.012 06/30/2019 1613   PHURINE 7.0 06/30/2019 1613   GLUCOSEU NEGATIVE 06/30/2019 1613   HGBUR NEGATIVE 06/30/2019 1613   BILIRUBINUR NEGATIVE 06/30/2019 1613   KETONESUR NEGATIVE 06/30/2019 1613   PROTEINUR 100 (A) 06/30/2019 1613   NITRITE NEGATIVE 06/30/2019 1613   LEUKOCYTESUR NEGATIVE 06/30/2019 1613   Sepsis Labs Invalid input(s): PROCALCITONIN,  WBC,  LACTICIDVEN Microbiology Recent Results (from the past 240 hour(s))  SARS CORONAVIRUS 2 (TAT 6-24 HRS) Nasopharyngeal Nasopharyngeal Swab     Status: None    Collection Time: 06/30/19  5:46 PM   Specimen: Nasopharyngeal Swab  Result Value Ref Range Status   SARS Coronavirus 2 NEGATIVE NEGATIVE Final    Comment: (NOTE) SARS-CoV-2 target nucleic acids are NOT DETECTED. The SARS-CoV-2 RNA is generally detectable in upper and lower respiratory specimens during the acute phase of infection. Negative results do not preclude SARS-CoV-2 infection, do not rule out co-infections with other pathogens, and should not be used as the sole basis for treatment or other patient management decisions. Negative results must be combined with clinical observations, patient history, and epidemiological information. The expected result is Negative. Fact Sheet for Patients: SugarRoll.be Fact Sheet for Healthcare Providers: https://www.woods-mathews.com/ This test is not yet approved or cleared by the [place]  FDA and  has been authorized for detection and/or diagnosis of SARS-CoV-2 by FDA under an Emergency Use Authorization (EUA). This EUA will remain  in effect (meaning this test can be used) for the duration of the COVID-19 declaration under Section 56 4(b)(1) of the Act, 21 U.S.C. section 360bbb-3(b)(1), unless the authorization is terminated or revoked sooner. Performed at [facility], [place] [address], [place], [place] [address]   Surgical pcr screen     Status: None   Collection Time: 06/30/19  9:54 PM   Specimen: Nasal Mucosa; Nasal Swab  Result Value Ref Range Status   MRSA, PCR NEGATIVE NEGATIVE Final   Staphylococcus aureus NEGATIVE NEGATIVE Final    Comment: (NOTE) The Xpert SA Assay (FDA approved for NASAL specimens in patients 65 years of age and older), is one component of a comprehensive surveillance program. It is not intended to diagnose infection nor to guide or monitor treatment. Performed at [facility], [place] [address], [place], [place] [address]    Time spent: 30  min  SIGNED:   Marylu Lund, MD  [facility] 07/06/2019, 9:30 AM  If 7PM-7AM, please contact night-coverage

## 2019-07-06 NOTE — Progress Notes (Signed)
Discharge instruction packet provided to the daughter and the patient.  Discharge instructions were reviewed with the daughter.  The patient will be discharged to home with planned 24 hour supervision.  The patient did receive equipment needed for discharge.

## 2019-07-06 NOTE — Progress Notes (Addendum)
Physical Therapy Treatment Patient Details Name: Manuel Lewis MRN: [id number] DOB: 13-Apr-1921 Today's Date: 07/06/2019    History of Present Illness Manuel Lewis is a 86 y.o. male admitted with left hip fx s/p fall.  Underwent Anterior THR 07/01/19.  PMH significant for hx of A. fib, complete heart block status post pacemaker placement, hypertension    PT Comments    Pt performed B LE exercises to review HEP, stair training/gt training and transfer training.  He has pretty good recall for safety.  Informed case management that he would need a RW before d/c home as he now reports he does not have one.  Pt to d/c home with support from his daughter.  OT dovetailed session.     Follow Up Recommendations  Home health PT     Equipment Recommendations  Rolling walker with 5" wheels;3in1 (PT)    Recommendations for Other Services       Precautions / Restrictions Precautions Precautions: Fall Restrictions Weight Bearing Restrictions: No LLE Weight Bearing: Weight bearing as tolerated    Mobility  Bed Mobility Overal bed mobility: Needs Assistance             General bed mobility comments: Pt recieved in recliner on arrival this session  Transfers Overall transfer level: Needs assistance Equipment used: Rolling walker (2 wheeled) Transfers: Sit to/from Stand Sit to Stand: Min guard         General transfer comment: Cues for hand placement and forward weight shifting to rise into standing.  Ambulation/Gait Ambulation/Gait assistance: Min guard Gait Distance (Feet): 215 Feet Assistive device: Rolling walker (2 wheeled) Gait Pattern/deviations: Step-through pattern;Trunk flexed Gait velocity: decreased   General Gait Details: Pt required cues for forward gaze and scapular retraction.  Tolerated increased distance.   Stairs Stairs: Yes Stairs assistance: Min guard Stair Management: One rail Left;Sideways Number of Stairs: 4 General stair comments: Cues for  sequencing and hand placement.  He was able to recall technique well this session.   Wheelchair Mobility    Modified Rankin (Stroke Patients Only)       Balance Overall balance assessment: Needs assistance Sitting-balance support: No upper extremity supported;Feet supported Sitting balance-Leahy Scale: Good     Standing balance support: During functional activity;Single extremity supported Standing balance-Leahy Scale: Fair Standing balance comment: able to static stand to pull pants up                            Cognition Arousal/Alertness: Awake/alert Behavior During Therapy: WFL for tasks assessed/performed Overall Cognitive Status: Within Functional Limits for tasks assessed                                        Exercises Total Joint Exercises Ankle Circles/Pumps: AROM;Both;20 reps;Supine Quad Sets: AROM;10 reps;Supine;Left Short Arc Quad: AROM;Left;10 reps;Supine Heel Slides: AAROM;10 reps;Supine;Left Hip ABduction/ADduction: 10 reps;Supine;Left;AROM Long Arc Quad: AROM;Left;10 reps;Seated    General Comments        Pertinent Vitals/Pain Pain Assessment: No/denies pain Faces Pain Scale: Hurts little more Pain Location: Lt hip  Pain Descriptors / Indicators: Tightness;Sore Pain Intervention(s): Monitored during session;Repositioned    Home Living Family/patient expects to be discharged to:: Private residence Living Arrangements: Alone Available Help at Discharge: Family;Available PRN/intermittently Type of Home: House Home Access: Stairs to enter Entrance Stairs-Rails: Right Home Layout: Two level;Able to live on main  level with bedroom/bathroom Home Equipment: Shower seat;Grab bars - tub/shower;Grab bars - toilet      Prior Function Level of Independence: Independent      Comments: still driving.  Enjoys flying, he was a Astronomer and a Print production planner.  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    PT Goals  (current goals can now be found in the care plan section) Acute Rehab PT Goals Patient Stated Goal: to go home today Potential to Achieve Goals: Good Progress towards PT goals: Progressing toward goals    Frequency    Min 5X/week      PT Plan Current plan remains appropriate    Co-evaluation              AM-PAC PT "6 Clicks" Mobility   Outcome Measure  Help needed turning from your back to your side while in a flat bed without using bedrails?: A Lot Help needed moving from lying on your back to sitting on the side of a flat bed without using bedrails?: A Lot Help needed moving to and from a bed to a chair (including a wheelchair)?: A Little Help needed standing up from a chair using your arms (e.g., wheelchair or bedside chair)?: A Little Help needed to walk in hospital room?: A Little Help needed climbing 3-5 steps with a railing? : A Little 6 Click Score: 16    End of Session Equipment Utilized During Treatment: Gait belt Activity Tolerance: Patient tolerated treatment well Patient left: Other (comment)(left dressing with OT post session.)   PT Visit Diagnosis: History of falling (Z91.81);Unsteadiness on feet (R26.81)     Time: [id number] PT Time Calculation (min) (ACUTE ONLY): 25 min  Charges:  $Gait Training: 8-22 mins $Therapeutic Exercise: 8-22 mins                     Erasmo Leventhal , PTA Acute Rehabilitation Services Pager [phone] Office [phone]     Sunita Demond Eli Hose 07/06/2019, 2:22 PM

## 2019-07-14 ENCOUNTER — Encounter: Payer: Self-pay | Admitting: Orthopaedic Surgery

## 2019-07-14 ENCOUNTER — Ambulatory Visit (INDEPENDENT_AMBULATORY_CARE_PROVIDER_SITE_OTHER): Payer: Medicare Other | Admitting: Orthopaedic Surgery

## 2019-07-14 ENCOUNTER — Other Ambulatory Visit: Payer: Self-pay

## 2019-07-14 DIAGNOSIS — Z96642 Presence of left artificial hip joint: Secondary | ICD-10-CM

## 2019-07-14 DIAGNOSIS — S72002A Fracture of unspecified part of neck of left femur, initial encounter for closed fracture: Secondary | ICD-10-CM

## 2019-07-14 NOTE — Progress Notes (Signed)
Mr. Turetsky comes in today 13 days status post a total hip arthroplasty of the left hip.  This was to treat an acute femoral neck fracture.  He is already ambulating with a cane.  He does stay at home with 24-hour assistance.  He is mobilizing better than most people to that seen this short of time after hip replacement surgery.  He denies any pain is not taking pain medications.  On exam his hip incision looks good so we remove the staples and place Steri-Strips.  He does have a hematoma but no seroma.  His leg lengths are equal.  He is ambulating with a cane easily gets up on the exam table easily.  He mobilizes around the office very well for his age of 86 years old.  His daughter is concerned about some pressure sores and I recommended certainly padding and DuoDERM as well as frequent repositioning and have let him know he can sleep on his side.  All questions and concerns were answered and addressed.  I would like to see back in 4 weeks just for repeat exam but no x-rays are needed.

## 2019-07-29 ENCOUNTER — Telehealth: Payer: Self-pay | Admitting: Orthopaedic Surgery

## 2019-07-29 NOTE — Telephone Encounter (Signed)
Patient's daughter called stating that the patient has been released from PT.  She would like to know how soon he can start driving or does he have to wait until his next appointment with Dr. [name].  [facility] CB#[phone].  Thank you.

## 2019-07-29 NOTE — Telephone Encounter (Signed)
Please advise 

## 2019-07-29 NOTE — Telephone Encounter (Signed)
It is ok for him to drive now.

## 2019-08-01 NOTE — Telephone Encounter (Signed)
Patient daughter aware. °

## 2019-08-09 ENCOUNTER — Encounter (INDEPENDENT_AMBULATORY_CARE_PROVIDER_SITE_OTHER): Payer: Self-pay | Admitting: Ophthalmology

## 2019-08-09 ENCOUNTER — Other Ambulatory Visit: Payer: Self-pay

## 2019-08-09 ENCOUNTER — Ambulatory Visit (INDEPENDENT_AMBULATORY_CARE_PROVIDER_SITE_OTHER): Payer: Medicare Other | Admitting: Ophthalmology

## 2019-08-09 DIAGNOSIS — H353123 Nonexudative age-related macular degeneration, left eye, advanced atrophic without subfoveal involvement: Secondary | ICD-10-CM | POA: Insufficient documentation

## 2019-08-09 DIAGNOSIS — Z9889 Other specified postprocedural states: Secondary | ICD-10-CM | POA: Diagnosis not present

## 2019-08-09 DIAGNOSIS — H43822 Vitreomacular adhesion, left eye: Secondary | ICD-10-CM

## 2019-08-09 DIAGNOSIS — H353114 Nonexudative age-related macular degeneration, right eye, advanced atrophic with subfoveal involvement: Secondary | ICD-10-CM | POA: Diagnosis not present

## 2019-08-09 DIAGNOSIS — H35363 Drusen (degenerative) of macula, bilateral: Secondary | ICD-10-CM

## 2019-08-09 NOTE — Assessment & Plan Note (Signed)
The nature of dry age related macular degeneration was discussed with the patient as well as its possible conversion to wet. The results of the AREDS 2 study was discussed with the patient. A diet rich in dark leafy green vegetables was advised and specific recommendations were made regarding supplements with AREDS 2 formulation . Control of hypertension and serum cholesterol may slow the disease. Smoking cessation is mandatory to slow the disease and diminish the risk of progressing to wet age related macular degeneration. The patient was instructed in the use of an [place] Grid and was told to return immediately for any changes in the Grid. Stressed to the patient do not rub eyes  The nature of age realated macular degeneration (ARMD)is explained as follows: The dry form refers to the progressive loss of normal blood supply to the central vision as a result of a combination of factors which include aging blood supply (arteriosclerosis, hardening of the arteries), genetics, smoking habits, and history of hypertension. Currently, no eye medications or vitamins slow this type of aging effect upon vision, however cessation of smoking and controlling hypertension help slow the disorder. The following analogy helps explain this: I describe the dry form of ARMD like a house of the same age as your eyes, which shows typical wear and tear of age upon the house structure and appearance. Like the aging house which can fall down structurally, the dry form of ARMD can deteriorate the structure of the macula (center) of the retina, most often gradually and affect central vision tasks such as reading and driving. The wet form of ARMD refers to the development of abnormally growing blood vessels usually near or under the central vision, with potential risk of permanent visual changes or vision losses. This complication of the Dry form of ARMD may be moderately reduced by use of AREDS 2 formula multivitamins daily. I describe the  Wet form of ARMD as like the development of a fire in an aging house (DRY ARMD). It may develop suddenly, progress rapidly and be destructive based on where it starts and how big the fire is when found. In the eye, the house fire analogy refers to the abnormal blood vessels growing destructively near the central vison in the retina, or film of the eye. Halting the growth of blood vessels with laser (hot or cold) or injectable medications is best way "to put the fire out". Many patients will experience a stabilization or even improvement in vision with a treatment course, while others may still face a loss of vision. The use of injectable medications has revolutionized therapy and is currently the only proven therapy to provide the chance of stable or improved acuity from new and recent destructive wet ARMD. 

## 2019-08-09 NOTE — Progress Notes (Signed)
08/09/2019     CHIEF COMPLAINT Patient presents for Retina Follow Up   HISTORY OF PRESENT ILLNESS: Manuel Lewis is a 86 y.o. male who presents to the clinic today for:   HPI    Retina Follow Up    Patient presents with  Dry AMD.  In both eyes.  Duration of 15 months.  Since onset it is gradually worsening.          Comments    15 month follow up - OCT OU Patients son states that the patient has had a decrease in vision in the last year. Patients son states that he has toruble seeing his medicine, and changing the batteries in his hearing aids.       Last edited by Gerda Diss on 08/09/2019  2:57 PM. (History)      Referring physician: [name], MD [address] [place],  [place] [address]  HISTORICAL INFORMATION:   Selected notes from the [facility]: No current outpatient medications on file. (Ophthalmic Drugs)   No current facility-administered medications for this visit. (Ophthalmic Drugs)   Current Outpatient Medications (Other)  Medication Sig  . amLODipine (NORVASC) 5 MG tablet Take 5 mg by mouth daily.  Marland Kitchen amLODipine (NORVASC) 5 MG tablet Take 5 mg by mouth daily.  . hydrochlorothiazide (,MICROZIDE/HYDRODIURIL,) 12.5 MG capsule Take 12.5 mg by mouth daily with breakfast.   . HYDROcodone-acetaminophen (NORCO/VICODIN) 5-325 MG tablet Take 1-2 tablets by mouth every 6 (six) hours as needed for moderate pain (pain score 4-6).  . magnesium hydroxide (MILK OF MAGNESIA) 400 MG/5ML suspension Take 15 mLs by mouth daily as needed for mild constipation.  . polyethylene glycol (MIRALAX / GLYCOLAX) 17 g packet Take 17 g by mouth daily as needed for mild constipation.  Marland Kitchen telmisartan (MICARDIS) 80 MG tablet Take 80 mg by mouth daily.  Marland Kitchen warfarin (COUMADIN) 2 MG tablet Take 1-2 mg by mouth every evening. Takes 1 mg in the evening everyday except on Monday's take 2 mg in the evening  . warfarin (COUMADIN) 2 MG tablet  Take 2 mg by mouth See admin instructions. 1mg  daily, except on Mondays - take 2mg  as directed.   No current facility-administered medications for this visit. (Other)      REVIEW OF SYSTEMS:    ALLERGIES No Known Allergies  PAST MEDICAL HISTORY Past Medical History:  Diagnosis Date  . Atrial fibrillation ([place])   . Chronic a-fib ([place])    since early 1990's with profound bradycardia and pauses; s/p  implantation  of Medtronic Adapta [id number], serial # [id number]  . Complication of anesthesia 1998   bradycardia  . Dysrhythmia   . Hearing loss   . Hypertension 1990  . Hypertension   . Inguinal hernia   . Macular degeneration   . Nocturia   . Pacemaker   . Prostate cancer ([place]) 1998   with seed implantation   Past Surgical History:  Procedure Laterality Date  . APPENDECTOMY  1941  . CARDIOVERSION     IN THE 1990's  . CATARACT EXTRACTION     2000 and 2007/ h/o macular degeneration since 2009  . CHOLECYSTECTOMY N/A 05/18/2016   Procedure: LAPAROSCOPIC CHOLECYSTECTOMY WITH INTRAOPERATIVE CHOLANGIOGRAM;  Surgeon: Alphonsa Overall, MD;  Location: [facility];  Service: General;  Laterality: N/A;  . ERCP N/A 05/21/2016   Procedure: ENDOSCOPIC RETROGRADE CHOLANGIOPANCREATOGRAPHY (ERCP);  Surgeon: Ladene Artist, MD;  Location: [facility];  Service: Endoscopy;  Laterality: N/A;  . FINE NEEDLE ASPIRATION Left 07/01/2019   Procedure: Fine Needle Aspiration of left knee;  Surgeon: Mcarthur Rossetti, MD;  Location: [facility];  Service: Orthopedics;  Laterality: Left;  . HERNIA REPAIR     1945 and 1985  . HERNIA REPAIR  08/18/11   inguinal  . INGUINAL HERNIA REPAIR  08/18/2011   Procedure: HERNIA REPAIR INGUINAL ADULT;  Surgeon: [name], MD;  Location: [facility];  Service: General;  Laterality: Left;  Recurrent Left inguinal hernia repair  . INGUINAL HERNIA REPAIR Right 04/02/2017   Procedure: OPEN RIGHT INGUINAL HERNIA REPAIR;  Surgeon: Alphonsa Overall, MD;  Location: [facility];  Service: General;  Laterality: Right;  . INSERT / REPLACE / REMOVE PACEMAKER  08/14/09   IMPLANTATION OF SINGLE CHAMBER PULSE  GENERATING SYSTEM;   MODEL ADSR01, SERIAL # [id number] H ADAPTA  . INSERTION OF MESH Right 04/02/2017   Procedure: INSERTION OF MESH;  Surgeon: Alphonsa Overall, MD;  Location: [facility];  Service: General;  Laterality: Right;  . pace maker    . PPM GENERATOR CHANGEOUT N/A 07/23/2018   Procedure: PPM GENERATOR CHANGEOUT;  Surgeon: Constance Haw, MD;  Location: [facility];  Service: Cardiovascular;  Laterality: N/A;  . THYROIDECTOMY, PARTIAL  1960   benign nodule  . TONSILLECTOMY    . TOTAL HIP ARTHROPLASTY Left 07/01/2019   Procedure: ANTERIOR TOTAL HIP ARTHROPLASTY;  Surgeon: Mcarthur Rossetti, MD;  Location: [facility];  Service: Orthopedics;  Laterality: Left;  . US ECHOCARDIOGRAPHY  07/26/2009   EF 55-60%    FAMILY HISTORY Family History  Problem Relation Age of Onset  . Stroke Mother   . Kidney failure Father     SOCIAL HISTORY Social History   Tobacco Use  . Smoking status: Never Smoker  . Smokeless tobacco: Never Used  Substance Use Topics  . Alcohol use: Not Currently    Comment: 1 GLASS OF WINE NIGHTLY  . Drug use: Not Currently         OPHTHALMIC EXAM:  Base Eye Exam    Visual Acuity (Snellen - Linear)      Right Left   Dist cc CF @ 7' CF @ 7'   Dist ph cc 20/200 20/200       Tonometry (Tonopen, 3:03 PM)      Right Left   Pressure 16 13       Pupils      Pupils Dark Light Shape React APD   Right PERRL 5 4 Round Brisk None   Left PERRL 5 4 Round Brisk None       Visual Fields (Counting fingers)      Left Right    Full Full       Extraocular Movement      Right Left    Full Full       Neuro/Psych    Oriented x3: Yes   Mood/Affect: Normal       Dilation    Both eyes: 1.0% Mydriacyl, 2.5% Phenylephrine @ 3:03 PM        Slit Lamp and Fundus Exam    External Exam      Right Left    External Normal Normal       Slit Lamp Exam      Right Left   Lids/Lashes Normal Normal   Conjunctiva/Sclera White and quiet White and quiet   Cornea Clear Clear   Anterior Chamber Deep  and quiet Deep and quiet   Iris Round and reactive Round and reactive   Lens Posterior chamber intraocular lens Posterior chamber intraocular lens   Anterior Vitreous Normal Normal       Fundus Exam      Right Left   Posterior Vitreous Normal Normal   Disc Normal Normal   C/D Ratio 0.3 0.45   Macula Geographic atrophy Geographic atrophy   Vessels Normal Normal   Periphery Normal Normal          IMAGING AND PROCEDURES  Imaging and Procedures for 08/09/19  OCT, Retina - OU - Both Eyes       Right Eye Quality was good. Scan locations included subfoveal.   Left Eye Quality was good. Scan locations included subfoveal.                 ASSESSMENT/PLAN:  Advanced nonexudative age-related macular degeneration of right eye with subfoveal involvement The nature of dry age related macular degeneration was discussed with the patient as well as its possible conversion to wet. The results of the AREDS 2 study was discussed with the patient. A diet rich in dark leafy green vegetables was advised and specific recommendations were made regarding supplements with AREDS 2 formulation . Control of hypertension and serum cholesterol may slow the disease. Smoking cessation is mandatory to slow the disease and diminish the risk of progressing to wet age related macular degeneration. The patient was instructed in the use of an [employer] and was told to return immediately for any changes in the Grid. Stressed to the patient do not rub eyes  The nature of age realated macular degeneration (ARMD)is explained as follows: The dry form refers to the progressive loss of normal blood supply to the central vision as a result of a combination of factors which include aging blood supply (arteriosclerosis,  hardening of the arteries), genetics, smoking habits, and history of hypertension. Currently, no eye medications or vitamins slow this type of aging effect upon vision, however cessation of smoking and controlling hypertension help slow the disorder. The following analogy helps explain this: I describe the dry form of ARMD like a house of the same age as your eyes, which shows typical wear and tear of age upon the house structure and appearance. Like the aging house which can fall down structurally, the dry form of ARMD can deteriorate the structure of the macula (center) of the retina, most often gradually and affect central vision tasks such as reading and driving. The wet form of ARMD refers to the development of abnormally growing blood vessels usually near or under the central vision, with potential risk of permanent visual changes or vision losses. This complication of the Dry form of ARMD may be moderately reduced by use of AREDS 2 formula multivitamins daily. I describe the Wet form of ARMD as like the development of a fire in an aging house (DRY ARMD). It may develop suddenly, progress rapidly and be destructive based on where it starts and how big the fire is when found. In the eye, the house fire analogy refers to the abnormal blood vessels growing destructively near the central vison in the retina, or film of the eye. Halting the growth of blood vessels with laser (hot or cold) or injectable medications is best way "to put the fire out". Many patients will experience a stabilization or even improvement in vision with a treatment course, while others may still face a loss of vision. The use of  injectable medications has revolutionized therapy and is currently the only proven therapy to provide the chance of stable or improved acuity from new and recent destructive wet ARMD.      ICD-10-CM   1. 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  H35.3123     2. Degenerative retinal drusen of both eyes  H35.363   3. Advanced nonexudative age-related macular degeneration of right eye with subfoveal involvement  H35.3114   4. History of vitrectomy  Z98.890   5. Vitreomacular adhesion of left eye  H43.822     1.  2.  3.  Ophthalmic Meds Ordered this visit:  No orders of the defined types were placed in this encounter.      Return in about 1 year (around 08/08/2020) for DILATE OU, OCT.  There are no Patient Instructions on file for this visit.   Explained the diagnoses, plan, and follow up with the patient and they expressed understanding.  Patient expressed understanding of the importance of proper follow up care.   Clent Demark Caldwell Kronenberger M.D. Diseases & Surgery of the Retina and Vitreous [facility] 08/09/19     Abbreviations: M myopia (nearsighted); A astigmatism; H hyperopia (farsighted); P presbyopia; Mrx spectacle prescription;  CTL contact lenses; OD right eye; OS left eye; OU both eyes  XT exotropia; ET esotropia; PEK punctate epithelial keratitis; PEE punctate epithelial erosions; DES dry eye syndrome; MGD meibomian gland dysfunction; ATs artificial tears; PFAT's preservative free artificial tears; [place] nuclear sclerotic cataract; PSC posterior subcapsular cataract; ERM epi-retinal membrane; PVD posterior vitreous detachment; RD retinal detachment; DM diabetes mellitus; DR diabetic retinopathy; NPDR non-proliferative diabetic retinopathy; PDR proliferative diabetic retinopathy; CSME clinically significant macular edema; DME diabetic macular edema; dbh dot blot hemorrhages; CWS cotton wool spot; POAG primary open angle glaucoma; C/D cup-to-disc ratio; HVF humphrey visual field; GVF goldmann visual field; OCT optical coherence tomography; IOP intraocular pressure; BRVO Branch retinal vein occlusion; CRVO central retinal vein occlusion; CRAO central retinal artery occlusion; BRAO branch retinal artery occlusion; RT retinal  tear; SB scleral buckle; PPV pars plana vitrectomy; VH Vitreous hemorrhage; PRP panretinal laser photocoagulation; IVK intravitreal kenalog; VMT vitreomacular traction; MH Macular hole;  NVD neovascularization of the disc; NVE neovascularization elsewhere; AREDS age related eye disease study; ARMD age related macular degeneration; POAG primary open angle glaucoma; EBMD epithelial/anterior basement membrane dystrophy; ACIOL anterior chamber intraocular lens; IOL intraocular lens; PCIOL posterior chamber intraocular lens; Phaco/IOL phacoemulsification with intraocular lens placement; [place] photorefractive keratectomy; LASIK laser assisted in situ keratomileusis; HTN hypertension; DM diabetes mellitus; COPD chronic obstructive pulmonary disease

## 2019-08-10 ENCOUNTER — Ambulatory Visit (INDEPENDENT_AMBULATORY_CARE_PROVIDER_SITE_OTHER): Payer: Medicare Other | Admitting: Orthopaedic Surgery

## 2019-08-10 ENCOUNTER — Encounter: Payer: Self-pay | Admitting: Orthopaedic Surgery

## 2019-08-10 DIAGNOSIS — Z96642 Presence of left artificial hip joint: Secondary | ICD-10-CM

## 2019-08-10 NOTE — Progress Notes (Signed)
Manuel Lewis is now 6 weeks status post her left total hip arthroplasty to treat a femoral neck fracture.  He is 86 years old he is now driving.  On exam he gets up slowly and walks slowly at first but then gets better mobility after that.  I recommend him try a cane.  He is very happy because is not having pain at all.  He reports good motion and strength.  I am able to move his left hip easily with no pain at all.  At this point we do not need to see him back for least 6 months.  If there is any issues we can see him before then.  I cautioned him on his balance and coordination.  At his visit in 6 months or like a standing low AP pelvis and lateral of his left operative hip.

## 2019-08-11 ENCOUNTER — Ambulatory Visit: Payer: Medicare Other | Admitting: Orthopaedic Surgery

## 2019-10-12 ENCOUNTER — Ambulatory Visit: Payer: Self-pay

## 2019-10-12 ENCOUNTER — Ambulatory Visit (HOSPITAL_COMMUNITY)
Admission: EM | Admit: 2019-10-12 | Discharge: 2019-10-12 | Disposition: A | Discharge status: Home / Self Care | Payer: Medicare Other | Attending: Family Medicine | Admitting: Family Medicine

## 2019-10-12 ENCOUNTER — Other Ambulatory Visit: Payer: Self-pay

## 2019-10-12 ENCOUNTER — Encounter (HOSPITAL_COMMUNITY): Payer: Self-pay | Admitting: Emergency Medicine

## 2019-10-12 DIAGNOSIS — H6123 Impacted cerumen, bilateral: Secondary | ICD-10-CM

## 2019-10-12 NOTE — ED Provider Notes (Signed)
[facility]    CSN: [id number] Arrival date & time: 10/12/19  1332      History   Chief Complaint Chief Complaint  Patient presents with  . Ear Problem    HPI Manuel Lewis is a 86 y.o. male.   Pt is a 86 year old male here for bilateral decreased hearing. Concern for ear wax impaction. No pain.      Past Medical History:  Diagnosis Date  . Atrial fibrillation ([place])   . Chronic a-fib ([place])    since early 1990's with profound bradycardia and pauses; s/p  implantation  of Medtronic Adapta [id number], serial # [id number]  . Complication of anesthesia 1998   bradycardia  . Dysrhythmia   . Hearing loss   . Hypertension 1990  . Hypertension   . Inguinal hernia   . Macular degeneration   . Nocturia   . Pacemaker   . Prostate cancer ([place]) 1998   with seed implantation    Patient Active Problem List   Diagnosis Date Noted  . 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 Degenerative retinal drusen of both eyes 08/09/2019  . Advanced nonexudative age-related macular degeneration of right eye with subfoveal involvement 08/09/2019  . History of vitrectomy 08/09/2019  . Vitreomacular adhesion of left eye 08/09/2019  . Atrial fibrillation ([place]) 07/01/2019  . Pacemaker 07/01/2019  . Essential hypertension 07/01/2019  . Normocytic normochromic anemia 07/01/2019  . Hip fracture ([place]) 07/01/2019  . Closed left hip fracture, initial encounter ([place]) 06/30/2019  . Common bile duct (CBD) obstruction   . Abnormal cholangiogram   . Gallstones   . Elevated LFTs   . Acute cholecystitis 05/16/2016  . Edema 06/30/2012  . Hypertension 08/06/2010  . Atrial fibrillation ([place]) 08/06/2010  . Bradycardia 08/06/2010  . Pacemaker-Single chamber-MDT 08/06/2010  . Complication of anesthesia 03/17/1996    Past Surgical History:  Procedure Laterality Date  . APPENDECTOMY  1941  . CARDIOVERSION     IN THE 1990's   . CATARACT EXTRACTION     2000 and 2007/ h/o macular degeneration since 2009  . CHOLECYSTECTOMY N/A 05/18/2016   Procedure: LAPAROSCOPIC CHOLECYSTECTOMY WITH INTRAOPERATIVE CHOLANGIOGRAM;  Surgeon: Alphonsa Overall, MD;  Location: [facility];  Service: General;  Laterality: N/A;  . ERCP N/A 05/21/2016   Procedure: ENDOSCOPIC RETROGRADE CHOLANGIOPANCREATOGRAPHY (ERCP);  Surgeon: Ladene Artist, MD;  Location: [facility];  Service: Endoscopy;  Laterality: N/A;  . FINE NEEDLE ASPIRATION Left 07/01/2019   Procedure: Fine Needle Aspiration of left knee;  Surgeon: Mcarthur Rossetti, MD;  Location: [facility];  Service: Orthopedics;  Laterality: Left;  . HERNIA REPAIR     1945 and 1985  . HERNIA REPAIR  08/18/11   inguinal  . INGUINAL HERNIA REPAIR  08/18/2011   Procedure: HERNIA REPAIR INGUINAL ADULT;  Surgeon: [name], MD;  Location: [facility];  Service: General;  Laterality: Left;  Recurrent Left inguinal hernia repair  . INGUINAL HERNIA REPAIR Right 04/02/2017   Procedure: OPEN RIGHT INGUINAL HERNIA REPAIR;  Surgeon: Alphonsa Overall, MD;  Location: [facility];  Service: General;  Laterality: Right;  . INSERT / REPLACE / REMOVE PACEMAKER  08/14/09   IMPLANTATION OF SINGLE CHAMBER PULSE  GENERATING SYSTEM;   MODEL ADSR01, SERIAL # [id number] H ADAPTA  . INSERTION OF MESH Right 04/02/2017   Procedure: INSERTION OF MESH;  Surgeon: Alphonsa Overall, MD;  Location: [facility];  Service: General;  Laterality: Right;  .  pace Agricultural engineer    . PPM GENERATOR CHANGEOUT N/A 07/23/2018   Procedure: PPM GENERATOR CHANGEOUT;  Surgeon: Constance Haw, MD;  Location: [facility];  Service: Cardiovascular;  Laterality: N/A;  . THYROIDECTOMY, PARTIAL  1960   benign nodule  . TONSILLECTOMY    . TOTAL HIP ARTHROPLASTY Left 07/01/2019   Procedure: ANTERIOR TOTAL HIP ARTHROPLASTY;  Surgeon: Mcarthur Rossetti, MD;  Location: [facility];  Service: Orthopedics;  Laterality: Left;  . US  ECHOCARDIOGRAPHY  07/26/2009   EF 55-60%       Home Medications    Prior to Admission medications   Medication Sig Start Date End Date Taking? Authorizing Provider  amLODipine (NORVASC) 5 MG tablet Take 5 mg by mouth daily. 12/16/17   [provider]  amLODipine (NORVASC) 5 MG tablet Take 5 mg by mouth daily. 03/31/19   [provider]  hydrochlorothiazide (,MICROZIDE/HYDRODIURIL,) 12.5 MG capsule Take 12.5 mg by mouth daily with breakfast.     [provider]  HYDROcodone-acetaminophen (NORCO/VICODIN) 5-325 MG tablet Take 1-2 tablets by mouth every 6 (six) hours as needed for moderate pain (pain score 4-6). 07/04/19   Mcarthur Rossetti, MD  magnesium hydroxide (MILK OF MAGNESIA) 400 MG/5ML suspension Take 15 mLs by mouth daily as needed for mild constipation.    [provider]  polyethylene glycol (MIRALAX / GLYCOLAX) 17 g packet Take 17 g by mouth daily as needed for mild constipation.    [provider]  telmisartan (MICARDIS) 80 MG tablet Take 80 mg by mouth daily.    [provider]  warfarin (COUMADIN) 2 MG tablet Take 1-2 mg by mouth every evening. Takes 1 mg in the evening everyday except on Monday's take 2 mg in the evening    [provider]  warfarin (COUMADIN) 2 MG tablet Take 2 mg by mouth See admin instructions. 1mg  daily, except on Mondays - take 2mg  as directed. 06/06/19   [provider]    Family History Family History  Problem Relation Age of Onset  . Stroke Mother   . Kidney failure Father     Social History Social History   Tobacco Use  . Smoking status: Never Smoker  . Smokeless tobacco: Never Used  Vaping Use  . Vaping Use: Never used  Substance Use Topics  . Alcohol use: Not Currently    Comment: 1 GLASS OF WINE NIGHTLY  . Drug use: Not Currently     Allergies   Patient has no known allergies.   Review of Systems Review of Systems   Physical Exam Triage Vital  Signs ED Triage Vitals  Enc Vitals Group     BP 10/12/19 1442 (!) 150/77     Pulse Rate 10/12/19 1442 88     Resp 10/12/19 1442 16     Temp 10/12/19 1442 98.4 F (36.9 C)     Temp Source 10/12/19 1442 Oral     SpO2 10/12/19 1442 99 %     Weight --      Height --      Head Circumference --      Peak Flow --      Pain Score 10/12/19 1529 0     Pain Loc --      Pain Edu? --      Excl. in [place]? --    No data found.  Updated Vital Signs BP (!) 150/77 (BP Location: Right Arm)   Pulse 88   Temp 98.4 F (36.9  C) (Oral)   Resp 16   SpO2 99%   Visual Acuity Right Eye Distance:   Left Eye Distance:   Bilateral Distance:    Right Eye Near:   Left Eye Near:    Bilateral Near:     Physical Exam Vitals and nursing note reviewed.  Constitutional:      Appearance: Normal appearance.  HENT:     Head: Normocephalic and atraumatic.     Right Ear: There is impacted cerumen.     Left Ear: There is impacted cerumen.  Eyes:     Conjunctiva/sclera: Conjunctivae normal.  Pulmonary:     Effort: Pulmonary effort is normal.  Musculoskeletal:        General: Normal range of motion.     Cervical back: Normal range of motion.  Skin:    General: Skin is warm and dry.  Neurological:     Mental Status: He is alert.      UC Treatments / Results  Labs (all labs ordered are listed, but only abnormal results are displayed) Labs Reviewed - No data to display  EKG   Radiology No results found.  Procedures Procedures (including critical care time)  Medications Ordered in UC Medications - No data to display  Initial Impression / Assessment and Plan / UC Course  I have reviewed the triage vital signs and the nursing notes.  Pertinent labs & imaging results that were available during my care of the patient were reviewed by me and considered in my medical decision making (see chart for details).     Bilateral cerumen impaction Ear irrigation completed here .  Patient feeling  much better. Follow up as needed for continued or worsening symptoms  Final Clinical Impressions(s) / UC Diagnoses   Final diagnoses:  Bilateral impacted cerumen     Discharge Instructions     We cleaned the ear wax out Follow up as needed for continued or worsening symptoms     ED Prescriptions    None     PDMP not reviewed this encounter.   Orvan July, NP 10/12/19 1546

## 2019-10-12 NOTE — Discharge Instructions (Signed)
We cleaned the ear wax out Follow up as needed for continued or worsening symptoms

## 2019-10-12 NOTE — ED Triage Notes (Signed)
Requesting bilateral ear irrigation for wax removal

## 2019-11-24 ENCOUNTER — Ambulatory Visit: Payer: Medicare Other | Admitting: Orthopaedic Surgery

## 2019-11-24 ENCOUNTER — Encounter: Payer: Self-pay | Admitting: Orthopaedic Surgery

## 2019-11-24 ENCOUNTER — Ambulatory Visit: Payer: Self-pay

## 2019-11-24 DIAGNOSIS — M1712 Unilateral primary osteoarthritis, left knee: Secondary | ICD-10-CM

## 2019-11-24 MED ORDER — METHYLPREDNISOLONE ACETATE 40 MG/ML IJ SUSP
30.0000 mg | INTRAMUSCULAR | Status: AC | PRN
  Administered 2019-11-24: 30 mg via INTRA_ARTICULAR

## 2019-11-24 MED ORDER — LIDOCAINE HCL 1 % IJ SOLN
5.0000 mL | INTRAMUSCULAR | Status: AC | PRN
  Administered 2019-11-24: 5 mL

## 2019-11-24 NOTE — Progress Notes (Signed)
Office Visit Note   Patient: Manuel Lewis           Date of Birth: 1922/01/01           MRN: [id number] Visit Date: 11/24/2019              Requested by: [name], MD [address] [place],  [place] [address] PCP: [name], MD   Assessment & Plan: Visit Diagnoses:  1. Primary osteoarthritis of left knee     Plan: Patient tolerated procedure today.  He understands is to wait at least 3 months between injections.  Questions were encouraged and answered by Dr. [name] and myself.  Follow-Up Instructions: Return if symptoms worsen or fail to improve.   Orders:  Orders Placed This Encounter  Procedures  . Large Joint Inj  . XR Knee 1-2 Views Left   No orders of the defined types were placed in this encounter.     Procedures: Large Joint Inj on 11/24/2019 4:32 PM Indications: pain Details: 22 G 1.5 in needle, anterolateral approach  Arthrogram: No  Medications: 5 mL lidocaine 1 %; 30 mg methylPREDNISolone acetate 40 MG/ML Outcome: tolerated well, no immediate complications Procedure, treatment alternatives, risks and benefits explained, specific risks discussed. Consent was given by the patient. Immediately prior to procedure a time out was called to verify the correct patient, procedure, equipment, support staff and site/side marked as required. Patient was prepped and draped in the usual sterile fashion.       Clinical Data: No additional findings.   Subjective: Chief Complaint  Patient presents with  . Left Knee - Pain    HPI Manuel Lewis 86 year old male is known to Dr. Trevor Mace service comes in today with left knee pain and swelling.  Presents today with his daughter she states that he has been getting a lot of trouble.  Said no known injury.  He has had no treatment for this.  States the pains been ongoing for the last couple of years.  Patient is nondiabetic.  Review of Systems Negative for fevers or chills.  Objective: Vital  Signs: There were no vitals taken for this visit.  Physical Exam Constitutional:      Appearance: He is normal weight. He is not ill-appearing or diaphoretic.  Pulmonary:     Effort: Pulmonary effort is normal.  Neurological:     Mental Status: He is alert and oriented to person, place, and time.  Psychiatric:        Mood and Affect: Mood normal.     Ortho Exam Bilateral knees patellofemoral crepitus.  Overall good range of motion of both knees.  Left knee with slight effusion.  No instability valgus varus stressing of either knee. Specialty Comments:  No specialty comments available.  Imaging: XR Knee 1-2 Views Left  Result Date: 11/24/2019 Left knee 2 views: No acute fracture.  Tricompartmental changes with near bone-on-bone medial lateral compartment and significant patellofemoral arthritic changes.  Periarticular spurs particularly over the lateral compartment.  Arthrosclerotic changes in the popliteal region.  Knee is well located.    PMFS History: Patient Active Problem List   Diagnosis Date Noted  . 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 Degenerative retinal drusen of both eyes 08/09/2019  . Advanced nonexudative age-related macular degeneration of right eye with subfoveal involvement 08/09/2019  . History of vitrectomy 08/09/2019  . Vitreomacular adhesion of left eye 08/09/2019  . Atrial fibrillation ([place]) 07/01/2019  .  Pacemaker 07/01/2019  . Essential hypertension 07/01/2019  . Normocytic normochromic anemia 07/01/2019  . Hip fracture ([place]) 07/01/2019  . Closed left hip fracture, initial encounter ([place]) 06/30/2019  . Common bile duct (CBD) obstruction   . Abnormal cholangiogram   . Gallstones   . Elevated LFTs   . Acute cholecystitis 05/16/2016  . Edema 06/30/2012  . Hypertension 08/06/2010  . Atrial fibrillation ([place]) 08/06/2010  . Bradycardia 08/06/2010  . Pacemaker-Single chamber-MDT  08/06/2010  . Complication of anesthesia 03/17/1996   Past Medical History:  Diagnosis Date  . Atrial fibrillation ([place])   . Chronic a-fib ([place])    since early 1990's with profound bradycardia and pauses; s/p  implantation  of Medtronic Adapta [id number], serial # [id number]  . Complication of anesthesia 1998   bradycardia  . Dysrhythmia   . Hearing loss   . Hypertension 1990  . Hypertension   . Inguinal hernia   . Macular degeneration   . Nocturia   . Pacemaker   . Prostate cancer ([place]) 1998   with seed implantation    Family History  Problem Relation Age of Onset  . Stroke Mother   . Kidney failure Father     Past Surgical History:  Procedure Laterality Date  . APPENDECTOMY  1941  . CARDIOVERSION     IN THE 1990's  . CATARACT EXTRACTION     2000 and 2007/ h/o macular degeneration since 2009  . CHOLECYSTECTOMY N/A 05/18/2016   Procedure: LAPAROSCOPIC CHOLECYSTECTOMY WITH INTRAOPERATIVE CHOLANGIOGRAM;  Surgeon: Alphonsa Overall, MD;  Location: [facility];  Service: General;  Laterality: N/A;  . ERCP N/A 05/21/2016   Procedure: ENDOSCOPIC RETROGRADE CHOLANGIOPANCREATOGRAPHY (ERCP);  Surgeon: Ladene Artist, MD;  Location: [facility];  Service: Endoscopy;  Laterality: N/A;  . FINE NEEDLE ASPIRATION Left 07/01/2019   Procedure: Fine Needle Aspiration of left knee;  Surgeon: Mcarthur Rossetti, MD;  Location: [facility];  Service: Orthopedics;  Laterality: Left;  . HERNIA REPAIR     1945 and 1985  . HERNIA REPAIR  08/18/11   inguinal  . INGUINAL HERNIA REPAIR  08/18/2011   Procedure: HERNIA REPAIR INGUINAL ADULT;  Surgeon: [name], MD;  Location: [facility];  Service: General;  Laterality: Left;  Recurrent Left inguinal hernia repair  . INGUINAL HERNIA REPAIR Right 04/02/2017   Procedure: OPEN RIGHT INGUINAL HERNIA REPAIR;  Surgeon: Alphonsa Overall, MD;  Location: [facility];  Service: General;  Laterality: Right;  . INSERT / REPLACE / REMOVE PACEMAKER  08/14/09    IMPLANTATION OF SINGLE CHAMBER PULSE  GENERATING SYSTEM;   MODEL ADSR01, SERIAL # [id number] H ADAPTA  . INSERTION OF MESH Right 04/02/2017   Procedure: INSERTION OF MESH;  Surgeon: Alphonsa Overall, MD;  Location: [facility];  Service: General;  Laterality: Right;  . pace maker    . PPM GENERATOR CHANGEOUT N/A 07/23/2018   Procedure: PPM GENERATOR CHANGEOUT;  Surgeon: Constance Haw, MD;  Location: [facility];  Service: Cardiovascular;  Laterality: N/A;  . THYROIDECTOMY, PARTIAL  1960   benign nodule  . TONSILLECTOMY    . TOTAL HIP ARTHROPLASTY Left 07/01/2019   Procedure: ANTERIOR TOTAL HIP ARTHROPLASTY;  Surgeon: Mcarthur Rossetti, MD;  Location: [facility];  Service: Orthopedics;  Laterality: Left;  . US ECHOCARDIOGRAPHY  07/26/2009   EF 55-60%   Social History   Occupational History  . 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 Smoking  status: Never Smoker  . Smokeless tobacco: Never Used  Vaping Use  . Vaping Use: Never used  Substance and Sexual Activity  . Alcohol use: Not Currently    Comment: 1 GLASS OF WINE NIGHTLY  . Drug use: Not Currently  . Sexual activity: Never

## 2019-12-20 ENCOUNTER — Ambulatory Visit: Payer: Medicare Other

## 2020-01-17 ENCOUNTER — Ambulatory Visit (INDEPENDENT_AMBULATORY_CARE_PROVIDER_SITE_OTHER): Payer: Medicare Other

## 2020-01-17 DIAGNOSIS — I4891 Unspecified atrial fibrillation: Secondary | ICD-10-CM | POA: Diagnosis not present

## 2020-01-17 LAB — CUP PACEART REMOTE DEVICE CHECK
Battery Remaining Longevity: 115 mo
Battery Voltage: 3.02 V
Brady Statistic RV Percent Paced: 99.32 %
Date Time Interrogation Session: 20211101201212
Implantable Lead Implant Date: 20110531
Implantable Lead Location: 753860
Implantable Lead Model: 4471
Implantable Lead Serial Number: 484940
Implantable Pulse Generator Implant Date: 20200508
Lead Channel Impedance Value: 266 Ohm
Lead Channel Impedance Value: 304 Ohm
Lead Channel Pacing Threshold Amplitude: 0.75 V
Lead Channel Pacing Threshold Pulse Width: 0.4 ms
Lead Channel Sensing Intrinsic Amplitude: 6.25 mV
Lead Channel Sensing Intrinsic Amplitude: 6.25 mV
Lead Channel Setting Pacing Amplitude: 2.5 V
Lead Channel Setting Pacing Pulse Width: 0.4 ms
Lead Channel Setting Sensing Sensitivity: 1.2 mV

## 2020-01-19 NOTE — Progress Notes (Signed)
Remote pacemaker transmission.   

## 2020-02-13 ENCOUNTER — Ambulatory Visit: Payer: Medicare Other | Admitting: Orthopaedic Surgery

## 2020-02-23 ENCOUNTER — Ambulatory Visit: Payer: Medicare Other | Admitting: Orthopaedic Surgery

## 2020-02-23 ENCOUNTER — Ambulatory Visit: Payer: Self-pay

## 2020-02-23 ENCOUNTER — Encounter: Payer: Self-pay | Admitting: Orthopaedic Surgery

## 2020-02-23 DIAGNOSIS — M1712 Unilateral primary osteoarthritis, left knee: Secondary | ICD-10-CM

## 2020-02-23 DIAGNOSIS — Z96642 Presence of left artificial hip joint: Secondary | ICD-10-CM

## 2020-02-23 NOTE — Progress Notes (Signed)
Mr. Parekh comes in today for follow-up for 2 different issues.  He has been dealing with left knee pain for some time now.  He feels like the the knee joint slides out of place.  He has had aspirations of that knee and steroid injections and this has not helped at all.  He is 86 years old but still very active.  In April of this year we did perform a acute left direct anterior hip replacement on his left hip secondary to a femoral neck fracture.  He says the hip is asymptomatic and has no issues at all.  His left hip moves smoothly and fluidly with no issues at all.  His left knee does have a mild effusion.  There is significant grinding of the knee of the patellofemoral joint as well as medially and laterally.  The knee moves well.  An AP pelvis and lateral left hip shows a well-seated total hip arthroplasty with no complicating features.  I did review the x-rays previously of his left knee.  There is end-stage arthritis of the left knee involving all 3 compartments.  Obviously at his age we are trying to avoid any type of surgical intervention such as knee replacement.  Steroid injections have only lasted about a week.  It is worth trying hyaluronic acid for his left knee at this point to treat the pain from osteoarthritis.  We will work on ordering that for him and call him when we get it approved and intensive we can place this into his left knee.  He and his son Dr. [name] agree with this.

## 2020-02-24 ENCOUNTER — Telehealth: Payer: Self-pay

## 2020-02-24 NOTE — Telephone Encounter (Signed)
Approved for Monovisc-Left knee Dr. Margarito Liner and [facility] No copay 5% OOP No prior auth required

## 2020-02-24 NOTE — Telephone Encounter (Signed)
Left knee gel injection ?

## 2020-02-24 NOTE — Telephone Encounter (Signed)
Submitted for VOB for Durolane-Left knee

## 2020-02-28 NOTE — Telephone Encounter (Signed)
Called and scheduled

## 2020-03-05 ENCOUNTER — Ambulatory Visit (INDEPENDENT_AMBULATORY_CARE_PROVIDER_SITE_OTHER): Payer: Medicare Other | Admitting: Orthopaedic Surgery

## 2020-03-05 ENCOUNTER — Encounter: Payer: Self-pay | Admitting: Orthopaedic Surgery

## 2020-03-05 DIAGNOSIS — M1712 Unilateral primary osteoarthritis, left knee: Secondary | ICD-10-CM

## 2020-03-05 MED ORDER — HYALURONAN 88 MG/4ML IX SOSY
88.0000 mg | PREFILLED_SYRINGE | INTRA_ARTICULAR | Status: AC | PRN
  Administered 2020-03-05: 17:00:00 88 mg via INTRA_ARTICULAR

## 2020-03-05 NOTE — Progress Notes (Signed)
   Procedure Note  Patient: Manuel Lewis             Date of Birth: 24-Feb-1922           MRN: [id number]             Visit Date: 03/05/2020  Procedures: Visit Diagnoses:  1. Unilateral primary osteoarthritis, left knee     Large Joint Inj: L knee on 03/05/2020 4:30 PM Indications: diagnostic evaluation and pain Details: 22 G 1.5 in needle, superolateral approach  Arthrogram: No  Medications: 88 mg Hyaluronan 88 MG/4ML Outcome: tolerated well, no immediate complications Procedure, treatment alternatives, risks and benefits explained, specific risks discussed. Consent was given by the patient. Immediately prior to procedure a time out was called to verify the correct patient, procedure, equipment, support staff and site/side marked as required. Patient was prepped and draped in the usual sterile fashion.     [name] comes in today for scheduled hyaluronic acid injection in his left knee with Monovisc to treat the pain from osteoarthritis.  Other treatment measures have not helped.  He occasionally has a recurrent effusion from that knee with known arthritis.  Today there is no significant effusion.  There is some global tenderness.  He ambulates well for being 86 years old.  I did place Monovisc in his left knee today without difficulty.  All questions and concerns were answered and addressed.  He can always have this again in 6 months if needed.

## 2020-06-27 ENCOUNTER — Encounter: Payer: Self-pay | Admitting: Orthopaedic Surgery

## 2020-06-27 ENCOUNTER — Ambulatory Visit (INDEPENDENT_AMBULATORY_CARE_PROVIDER_SITE_OTHER): Payer: Medicare Other | Admitting: Orthopaedic Surgery

## 2020-06-27 DIAGNOSIS — M25462 Effusion, left knee: Secondary | ICD-10-CM

## 2020-06-27 DIAGNOSIS — M1712 Unilateral primary osteoarthritis, left knee: Secondary | ICD-10-CM

## 2020-06-27 MED ORDER — METHYLPREDNISOLONE ACETATE 40 MG/ML IJ SUSP
40.0000 mg | INTRAMUSCULAR | Status: AC | PRN
  Administered 2020-06-27: 40 mg via INTRA_ARTICULAR

## 2020-06-27 MED ORDER — LIDOCAINE HCL 1 % IJ SOLN
3.0000 mL | INTRAMUSCULAR | Status: AC | PRN
  Administered 2020-06-27: 3 mL

## 2020-06-27 NOTE — Progress Notes (Signed)
Office Visit Note   Patient: Manuel Lewis           Date of Birth: December 30, 1921           MRN: [id number] Visit Date: 06/27/2020              Requested by: [name], MD [address],  [place] [address] PCP: [name], MD   Assessment & Plan: Visit Diagnoses:  1. Unilateral primary osteoarthritis, left knee   2. Effusion, left knee     Plan: I was able to aspirate 50 cc of fluid from his left knee and then place a steroid in the left knee without difficulty.  We will see if we can order a special brace for his left knee that will also give him some support as well given his advanced age of 87 years old.  He is not someone that we really want to put through any type of surgical intervention.  He and his daughter agree with this treatment plan.  We will be in touch about the brace.  Follow-Up Instructions: No follow-ups on file.   Orders:  No orders of the defined types were placed in this encounter.  No orders of the defined types were placed in this encounter.     Procedures: Large Joint Inj: L knee on 06/27/2020 4:34 PM Indications: diagnostic evaluation and pain Details: 22 G 1.5 in needle, superolateral approach  Arthrogram: No  Medications: 3 mL lidocaine 1 %; 40 mg methylPREDNISolone acetate 40 MG/ML Outcome: tolerated well, no immediate complications Procedure, treatment alternatives, risks and benefits explained, specific risks discussed. Consent was given by the patient. Immediately prior to procedure a time out was called to verify the correct patient, procedure, equipment, support staff and site/side marked as required. Patient was prepped and draped in the usual sterile fashion.       Clinical Data: No additional findings.   Subjective: Chief Complaint  Patient presents with  . Left Knee - Follow-up  The patient is a 87 year old gentleman well-known to me.  He has known significant arthritis in his left knee that is  tricompartmental involving mainly the medial aspect of the knee.  We last placed hyaluronic acid in his knee 4 months ago.  The knee has been bothering him quite a bit and he does have an effusion.  His daughter is with him today as well.  They are inquiring about potentially an offloading osteoarthritis brace for his knee.  HPI  Review of Systems There is currently no fever, chills, nausea, vomiting  Objective: Vital Signs: There were no vitals taken for this visit.  Physical Exam He is alert and orient x3 and in no acute distress Ortho Exam Examination of his left knee does show moderate effusion.  There is medial joint line tenderness and slight varus malalignment. Specialty Comments:  No specialty comments available.  Imaging: No results found.   PMFS History: Patient Active Problem List   Diagnosis Date Noted  . Unilateral primary osteoarthritis, left knee 02/23/2020  . 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 Degenerative retinal drusen of both eyes 08/09/2019  . Advanced nonexudative age-related macular degeneration of right eye with subfoveal involvement 08/09/2019  . History of vitrectomy 08/09/2019  . Vitreomacular adhesion of left eye 08/09/2019  . Atrial fibrillation ([facility]) 07/01/2019  . Pacemaker 07/01/2019  . Essential hypertension 07/01/2019  . Normocytic normochromic anemia 07/01/2019  . Hip fracture ([facility])  07/01/2019  . Closed left hip fracture, initial encounter ([facility]) 06/30/2019  . Common bile duct (CBD) obstruction   . Abnormal cholangiogram   . Gallstones   . Elevated LFTs   . Acute cholecystitis 05/16/2016  . Edema 06/30/2012  . Hypertension 08/06/2010  . Atrial fibrillation ([facility]) 08/06/2010  . Bradycardia 08/06/2010  . Pacemaker-Single chamber-MDT 08/06/2010  . Complication of anesthesia 03/17/1996   Past Medical History:  Diagnosis Date  . Atrial fibrillation ([facility])   . Chronic  a-fib ([facility] [place])    since early 1990's with profound bradycardia and pauses; s/p  implantation  of Medtronic Adapta [id number], serial # [id number]  . Complication of anesthesia 1998   bradycardia  . Dysrhythmia   . Hearing loss   . Hypertension 1990  . Hypertension   . Inguinal hernia   . Macular degeneration   . Nocturia   . Pacemaker   . Prostate cancer ([place]) 1998   with seed implantation    Family History  Problem Relation Age of Onset  . Stroke Mother   . Kidney failure Father     Past Surgical History:  Procedure Laterality Date  . APPENDECTOMY  1941  . CARDIOVERSION     IN THE 1990's  . CATARACT EXTRACTION     2000 and 2007/ h/o macular degeneration since 2009  . CHOLECYSTECTOMY N/A 05/18/2016   Procedure: LAPAROSCOPIC CHOLECYSTECTOMY WITH INTRAOPERATIVE CHOLANGIOGRAM;  Surgeon: Alphonsa Overall, MD;  [facility];  Service: General;  Laterality: N/A;  . ERCP N/A 05/21/2016   Procedure: ENDOSCOPIC RETROGRADE CHOLANGIOPANCREATOGRAPHY (ERCP);  Surgeon: Ladene Artist, MD;  Location: [facility];  Service: Endoscopy;  Laterality: N/A;  . FINE NEEDLE ASPIRATION Left 07/01/2019   Procedure: Fine Needle Aspiration of left knee;  Surgeon: Mcarthur Rossetti, MD;  Location: [facility];  Service: Orthopedics;  Laterality: Left;  . HERNIA REPAIR     1945 and 1985  . HERNIA REPAIR  08/18/11   inguinal  . INGUINAL HERNIA REPAIR  08/18/2011   Procedure: HERNIA REPAIR INGUINAL ADULT;  Surgeon: [name], MD;  Location: [facility];  Service: General;  Laterality: Left;  Recurrent Left inguinal hernia repair  . INGUINAL HERNIA REPAIR Right 04/02/2017   Procedure: OPEN RIGHT INGUINAL HERNIA REPAIR;  Surgeon: Alphonsa Overall, MD;  Location: [facility];  Service: General;  Laterality: Right;  . INSERT / REPLACE / REMOVE PACEMAKER  08/14/09   IMPLANTATION OF SINGLE CHAMBER PULSE  GENERATING SYSTEM;   MODEL ADSR01, SERIAL # [id number] H ADAPTA  . INSERTION OF MESH Right 04/02/2017    Procedure: INSERTION OF MESH;  Surgeon: Alphonsa Overall, MD;  Location: [facility];  Service: General;  Laterality: Right;  . pace maker    . PPM GENERATOR CHANGEOUT N/A 07/23/2018   Procedure: PPM GENERATOR CHANGEOUT;  Surgeon: Constance Haw, MD;  Location: [facility];  Service: Cardiovascular;  Laterality: N/A;  . THYROIDECTOMY, PARTIAL  1960   benign nodule  . TONSILLECTOMY    . TOTAL HIP ARTHROPLASTY Left 07/01/2019   Procedure: ANTERIOR TOTAL HIP ARTHROPLASTY;  Surgeon: Mcarthur Rossetti, MD;  Location: [facility];  Service: Orthopedics;  Laterality: Left;  . US ECHOCARDIOGRAPHY  07/26/2009   EF 55-60%   Social History   Occupational History  . 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 Smoking status: Never Smoker  . Smokeless tobacco: Never Used  Vaping Use  . Vaping Use: Never used  Substance and Sexual Activity  . Alcohol use: Not Currently    Comment: 1 GLASS OF WINE NIGHTLY  . Drug use: Not Currently  . Sexual activity: Never

## 2020-07-17 ENCOUNTER — Ambulatory Visit (INDEPENDENT_AMBULATORY_CARE_PROVIDER_SITE_OTHER): Payer: Medicare Other

## 2020-07-17 DIAGNOSIS — I4891 Unspecified atrial fibrillation: Secondary | ICD-10-CM

## 2020-07-19 LAB — CUP PACEART REMOTE DEVICE CHECK
Battery Remaining Longevity: 109 mo
Battery Voltage: 3.01 V
Brady Statistic RV Percent Paced: 99.84 %
Date Time Interrogation Session: 20220504191308
Implantable Lead Implant Date: 20110531
Implantable Lead Location: 753860
Implantable Lead Model: 4471
Implantable Lead Serial Number: 484940
Implantable Pulse Generator Implant Date: 20200508
Lead Channel Impedance Value: 266 Ohm
Lead Channel Impedance Value: 304 Ohm
Lead Channel Pacing Threshold Amplitude: 0.875 V
Lead Channel Pacing Threshold Pulse Width: 0.4 ms
Lead Channel Sensing Intrinsic Amplitude: 6.125 mV
Lead Channel Sensing Intrinsic Amplitude: 6.125 mV
Lead Channel Setting Pacing Amplitude: 2.5 V
Lead Channel Setting Pacing Pulse Width: 0.4 ms
Lead Channel Setting Sensing Sensitivity: 1.2 mV

## 2020-08-07 ENCOUNTER — Encounter (INDEPENDENT_AMBULATORY_CARE_PROVIDER_SITE_OTHER): Payer: Medicare Other | Admitting: Ophthalmology

## 2020-08-07 NOTE — Progress Notes (Signed)
Remote pacemaker transmission.   

## 2020-08-08 ENCOUNTER — Ambulatory Visit: Payer: Medicare Other | Admitting: Orthopaedic Surgery

## 2020-08-29 ENCOUNTER — Encounter (INDEPENDENT_AMBULATORY_CARE_PROVIDER_SITE_OTHER): Payer: Medicare Other | Admitting: Ophthalmology

## 2020-09-13 ENCOUNTER — Ambulatory Visit (INDEPENDENT_AMBULATORY_CARE_PROVIDER_SITE_OTHER): Payer: Medicare Other | Admitting: Ophthalmology

## 2020-09-13 ENCOUNTER — Encounter (INDEPENDENT_AMBULATORY_CARE_PROVIDER_SITE_OTHER): Payer: Self-pay | Admitting: Ophthalmology

## 2020-09-13 ENCOUNTER — Other Ambulatory Visit: Payer: Self-pay

## 2020-09-13 DIAGNOSIS — H353114 Nonexudative age-related macular degeneration, right eye, advanced atrophic with subfoveal involvement: Secondary | ICD-10-CM

## 2020-09-13 DIAGNOSIS — H353123 Nonexudative age-related macular degeneration, left eye, advanced atrophic without subfoveal involvement: Secondary | ICD-10-CM | POA: Diagnosis not present

## 2020-09-13 NOTE — Progress Notes (Signed)
09/13/2020     CHIEF COMPLAINT Patient presents for Retina Follow Up (1 year fu OU and OCT/Pt states, "I feel like my va has gotten a little worse in the last year but I am not sure how to express the amount of change."/)   HISTORY OF PRESENT ILLNESS: Manuel Lewis is a 87 y.o. male who presents to the clinic today for:   HPI     Retina Follow Up           Diagnosis: Wet AMD   Laterality: left eye   Onset: 1 year ago   Severity: mild   Duration: 1 year   Course: gradually worsening   Comments: 1 year fu OU and OCT Pt states, "I feel like my va has gotten a little worse in the last year but I am not sure how to express the amount of change."        Last edited by Kendra Opitz, COA on 09/13/2020  2:04 PM.      Referring physician: [name], MD [address] [place],  [place] [address]  HISTORICAL INFORMATION:   Selected notes from the [facility]: No current outpatient medications on file. (Ophthalmic Drugs)   No current facility-administered medications for this visit. (Ophthalmic Drugs)   Current Outpatient Medications (Other)  Medication Sig   amLODipine (NORVASC) 5 MG tablet Take 5 mg by mouth daily.   amLODipine (NORVASC) 5 MG tablet Take 5 mg by mouth daily.   hydrochlorothiazide (,MICROZIDE/HYDRODIURIL,) 12.5 MG capsule Take 12.5 mg by mouth daily with breakfast.    HYDROcodone-acetaminophen (NORCO/VICODIN) 5-325 MG tablet Take 1-2 tablets by mouth every 6 (six) hours as needed for moderate pain (pain score 4-6).   magnesium hydroxide (MILK OF MAGNESIA) 400 MG/5ML suspension Take 15 mLs by mouth daily as needed for mild constipation.   polyethylene glycol (MIRALAX / GLYCOLAX) 17 g packet Take 17 g by mouth daily as needed for mild constipation.   telmisartan (MICARDIS) 80 MG tablet Take 80 mg by mouth daily.   warfarin (COUMADIN) 2 MG tablet Take 1-2 mg by mouth every evening. Takes 1 mg in the  evening everyday except on Monday's take 2 mg in the evening   warfarin (COUMADIN) 2 MG tablet Take 2 mg by mouth See admin instructions. 1mg  daily, except on Mondays - take 2mg  as directed.   No current facility-administered medications for this visit. (Other)      REVIEW OF SYSTEMS:    ALLERGIES No Known Allergies  PAST MEDICAL HISTORY Past Medical History:  Diagnosis Date   Atrial fibrillation ([facility])    Chronic a-fib ([facility])    since early 1990's with profound bradycardia and pauses; s/p  implantation  of Medtronic Adapta ADSR01, serial # [id number]   Complication of anesthesia 1998   bradycardia   Dysrhythmia    Hearing loss    Hypertension 1990   Hypertension    Inguinal hernia    Macular degeneration    Nocturia    Pacemaker    Prostate cancer ([facility]) 1998   with seed implantation   Past Surgical History:  Procedure Laterality Date   APPENDECTOMY  1941   CARDIOVERSION     IN THE 1990's   CATARACT EXTRACTION     2000 and 2007/ h/o macular degeneration since 2009   CHOLECYSTECTOMY N/A 05/18/2016   Procedure: LAPAROSCOPIC CHOLECYSTECTOMY WITH INTRAOPERATIVE CHOLANGIOGRAM;  Surgeon: Alphonsa Overall, MD;  [facility];  Service: General;  Laterality: N/A;   ERCP N/A 05/21/2016   Procedure: ENDOSCOPIC RETROGRADE CHOLANGIOPANCREATOGRAPHY (ERCP);  Surgeon: Ladene Artist, MD;  Location: [facility];  Service: Endoscopy;  Laterality: N/A;   FINE NEEDLE ASPIRATION Left 07/01/2019   Procedure: Fine Needle Aspiration of left knee;  Surgeon: Mcarthur Rossetti, MD;  Location: [facility];  Service: Orthopedics;  Laterality: Left;   HERNIA REPAIR     1945 and 1985   HERNIA REPAIR  08/18/11   inguinal   INGUINAL HERNIA REPAIR  08/18/2011   Procedure: HERNIA REPAIR INGUINAL ADULT;  Surgeon: [name], MD;  Location: [facility];  Service: General;  Laterality: Left;  Recurrent Left inguinal hernia repair   INGUINAL HERNIA REPAIR Right 04/02/2017   Procedure: OPEN RIGHT INGUINAL  HERNIA REPAIR;  Surgeon: Alphonsa Overall, MD;  Location: [facility];  Service: General;  Laterality: Right;   INSERT / REPLACE / REMOVE PACEMAKER  08/14/09   IMPLANTATION OF SINGLE CHAMBER PULSE  GENERATING SYSTEM;   MODEL ADSR01, SERIAL # [id number] H ADAPTA   INSERTION OF MESH Right 04/02/2017   Procedure: INSERTION OF MESH;  Surgeon: Alphonsa Overall, MD;  Location: [facility];  Service: General;  Laterality: Right;   pace maker     PPM GENERATOR CHANGEOUT N/A 07/23/2018   Procedure: PPM GENERATOR CHANGEOUT;  Surgeon: Constance Haw, MD;  Location: [facility];  Service: Cardiovascular;  Laterality: N/A;   THYROIDECTOMY, PARTIAL  1960   benign nodule   TONSILLECTOMY     TOTAL HIP ARTHROPLASTY Left 07/01/2019   Procedure: ANTERIOR TOTAL HIP ARTHROPLASTY;  Surgeon: Mcarthur Rossetti, MD;  Location: [facility];  Service: Orthopedics;  Laterality: Left;   US ECHOCARDIOGRAPHY  07/26/2009   EF 55-60%    FAMILY HISTORY Family History  Problem Relation Age of Onset   Stroke Mother    Kidney failure Father     SOCIAL HISTORY Social History   Tobacco Use   Smoking status: Never   Smokeless tobacco: Never  Vaping Use   Vaping Use: Never used  Substance Use Topics   Alcohol use: Not Currently    Comment: 1 GLASS OF WINE NIGHTLY   Drug use: Not Currently         OPHTHALMIC EXAM:  Base Eye Exam     Visual Acuity (ETDRS)       Right Left   Dist [place] 20/200 20/200   Dist ph [place] NI NI         Tonometry (Tonopen, 2:08 PM)       Right Left   Pressure 14 15         Pupils       Pupils Dark Light Shape React APD   Right PERRL 4 3 Round Brisk None   Left PERRL 4 3 Round Brisk None         Visual Fields (Counting fingers)       Left Right    Full Full         Extraocular Movement       Right Left    Full Full         Neuro/Psych     Oriented x3: Yes   Mood/Affect: Normal         Dilation     Both eyes: 1.0%  Mydriacyl, 2.5% Phenylephrine @ 2:08 PM           Slit Lamp and Fundus Exam  External Exam       Right Left   External Normal Normal         Slit Lamp Exam       Right Left   Lids/Lashes Normal Normal   Conjunctiva/Sclera White and quiet White and quiet   Cornea Clear Clear   Anterior Chamber Deep and quiet Deep and quiet   Iris Round and reactive Round and reactive   Lens Posterior chamber intraocular lens Posterior chamber intraocular lens   Anterior Vitreous Normal Normal         Fundus Exam       Right Left   Posterior Vitreous Normal Normal   Disc Normal Normal   C/D Ratio 0.3 0.45   Macula Geographic atrophy, no hemorrhage, no membrane, Hard drusen Geographic atrophy   Vessels Normal Normal   Periphery Normal Normal            IMAGING AND PROCEDURES  Imaging and Procedures for 09/13/20  OCT, Retina - OU - Both Eyes       Right Eye Quality was good. Scan locations included subfoveal. Central Foveal Thickness: 282. Progression has worsened. Findings include retinal drusen , abnormal foveal contour, central retinal atrophy, outer retinal atrophy, inner retinal atrophy.   Left Eye Quality was good. Scan locations included subfoveal. Central Foveal Thickness: 211. Progression has worsened. Findings include outer retinal atrophy, central retinal atrophy, inner retinal atrophy.   Notes Bilateral central atrophic retinal changes with outer retinal atrophy, with choriocapillaris atrophy with RPE atrophy approximately 6-8 disc areas in extent OU.  This is very low risk for developing CNVM.             ASSESSMENT/PLAN:  No problem-specific Assessment & Plan notes found for this encounter.      ICD-10-CM   1. 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  Bilateral geographic atrophy.  Dry ARMD.  No signs of CNVM.  No risk factors for CNVM development with the extensive dry macular degeneration present.  These findings reviewed with daughter and patient.  2.  I reassured all that the peripheral vision chalazion intact for this disorder  3.  Ophthalmic Meds Ordered this visit:  No orders of the defined types were placed in this encounter.      Return in about 2 years (around 09/14/2022) for DILATE OU, COLOR FP.  There are no Patient Instructions on file for this visit.   Explained the diagnoses, plan, and follow up with the patient and they expressed understanding.  Patient expressed understanding of the importance of proper follow up care.   Clent Demark Tammela Bales M.D. Diseases & Surgery of the Retina and Vitreous [facility] 09/13/20     Abbreviations: M myopia (nearsighted); A astigmatism; H hyperopia (farsighted); P presbyopia; Mrx spectacle prescription;  CTL contact lenses; OD right eye; OS left eye; OU both eyes  XT exotropia; ET esotropia; PEK punctate epithelial keratitis; PEE punctate epithelial erosions; DES dry eye syndrome; MGD meibomian gland dysfunction; ATs artificial tears; PFAT's preservative free artificial tears; [place] nuclear sclerotic cataract; PSC posterior subcapsular cataract; ERM epi-retinal membrane; PVD posterior vitreous detachment; RD retinal detachment; DM diabetes mellitus; DR diabetic retinopathy; NPDR non-proliferative diabetic retinopathy; PDR proliferative diabetic retinopathy; CSME clinically  significant macular edema; DME diabetic macular edema; dbh dot blot hemorrhages; CWS cotton wool spot; POAG primary open angle glaucoma; C/D cup-to-disc ratio; HVF humphrey visual field; GVF goldmann visual field; OCT optical coherence tomography; IOP intraocular pressure; BRVO Branch retinal vein occlusion; CRVO central retinal vein occlusion; CRAO central  retinal artery occlusion; BRAO branch retinal artery occlusion; RT retinal tear; SB scleral buckle; PPV pars plana vitrectomy; VH Vitreous hemorrhage; PRP panretinal laser photocoagulation; IVK intravitreal kenalog; VMT vitreomacular traction; MH Macular hole;  NVD neovascularization of the disc; NVE neovascularization elsewhere; AREDS age related eye disease study; ARMD age related macular degeneration; POAG primary open angle glaucoma; EBMD epithelial/anterior basement membrane dystrophy; ACIOL anterior chamber intraocular lens; IOL intraocular lens; PCIOL posterior chamber intraocular lens; Phaco/IOL phacoemulsification with intraocular lens placement; [place] photorefractive keratectomy; LASIK laser assisted in situ keratomileusis; HTN hypertension; DM diabetes mellitus; COPD chronic obstructive pulmonary disease

## 2021-01-15 ENCOUNTER — Ambulatory Visit (INDEPENDENT_AMBULATORY_CARE_PROVIDER_SITE_OTHER): Payer: Medicare Other

## 2021-01-15 DIAGNOSIS — I4891 Unspecified atrial fibrillation: Secondary | ICD-10-CM

## 2021-01-17 LAB — CUP PACEART REMOTE DEVICE CHECK
Battery Remaining Longevity: 103 mo
Battery Voltage: 3.01 V
Brady Statistic RV Percent Paced: 99.27 %
Date Time Interrogation Session: 20221102184924
Implantable Lead Implant Date: 20110531
Implantable Lead Location: 753860
Implantable Lead Model: 4471
Implantable Lead Serial Number: 484940
Implantable Pulse Generator Implant Date: 20200508
Lead Channel Impedance Value: 266 Ohm
Lead Channel Impedance Value: 304 Ohm
Lead Channel Pacing Threshold Amplitude: 0.75 V
Lead Channel Pacing Threshold Pulse Width: 0.4 ms
Lead Channel Sensing Intrinsic Amplitude: 5.25 mV
Lead Channel Sensing Intrinsic Amplitude: 5.25 mV
Lead Channel Setting Pacing Amplitude: 2.5 V
Lead Channel Setting Pacing Pulse Width: 0.4 ms
Lead Channel Setting Sensing Sensitivity: 1.2 mV

## 2021-01-22 NOTE — Progress Notes (Signed)
Remote pacemaker transmission.   

## 2021-02-06 ENCOUNTER — Other Ambulatory Visit: Payer: Self-pay | Admitting: Plastic Surgery

## 2021-02-19 ENCOUNTER — Encounter: Payer: Medicare Other | Admitting: Internal Medicine

## 2021-02-19 NOTE — Progress Notes (Incomplete)
Patient Care Team: [name], MD as PCP - General (Internal Medicine) [name], MD (Internal Medicine)   HPI  Manuel Lewis is a 87 y.o. male here for physician pacer CK.  Seen to followup for  pacemaker care for single-chamber device implanted in May 2011 for bradycardia in the setting of permanent atrial fibrillation. Thromboembolic risk factors are notable for age-35 and hypertension for a CHADS VASC score of 3      Date Cr K Hgb  3/18 0.82  11.8  1/19  1.04 3.9 12.9        The patient denies chest pain, shortness of breath, nocturnal dyspnea, orthopnea or peripheral edema.  There have been no palpitations, lightheadedness or syncope.   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  There have been no palpitations***, lightheadedness*** or syncope***.  Complains of ***.   Past Medical History:  Diagnosis Date   Atrial fibrillation ([place])    Chronic a-fib ([place] [place])    since early 1990's with profound bradycardia and pauses; s/p  implantation  of Medtronic Adapta ADSR01, serial # [id number]   Complication of anesthesia 1998   bradycardia   Dysrhythmia    Hearing loss    Hypertension 1990   Hypertension    Inguinal hernia    Macular degeneration    Nocturia    Pacemaker    Prostate cancer ([facility]) 1998   with seed implantation    Past Surgical History:  Procedure Laterality Date   APPENDECTOMY  1941   CARDIOVERSION     IN THE 1990's   CATARACT EXTRACTION     2000 and 2007/ h/o macular degeneration since 2009   CHOLECYSTECTOMY N/A 05/18/2016   Procedure: LAPAROSCOPIC CHOLECYSTECTOMY WITH INTRAOPERATIVE CHOLANGIOGRAM;  Surgeon: Alphonsa Overall, MD;  [facility];  Service: General;  Laterality: N/A;   ERCP N/A 05/21/2016   Procedure: ENDOSCOPIC RETROGRADE CHOLANGIOPANCREATOGRAPHY (ERCP);   Surgeon: Ladene Artist, MD;  [facility];  Service: Endoscopy;  Laterality: N/A;   FINE NEEDLE ASPIRATION Left 07/01/2019   Procedure: Fine Needle Aspiration of left knee;  Surgeon: Mcarthur Rossetti, MD;  Location: [facility];  Service: Orthopedics;  Laterality: Left;   HERNIA REPAIR     1945 and 1985   HERNIA REPAIR  08/18/11   inguinal   INGUINAL HERNIA REPAIR  08/18/2011   Procedure: HERNIA REPAIR INGUINAL ADULT;  Surgeon: [name], MD;  Location: [facility];  Service: General;  Laterality: Left;  Recurrent Left inguinal hernia repair   INGUINAL HERNIA REPAIR Right 04/02/2017   Procedure: OPEN RIGHT INGUINAL HERNIA REPAIR;  Surgeon: Alphonsa Overall, MD;  Location: [facility];  Service: General;  Laterality: Right;   INSERT / REPLACE / REMOVE PACEMAKER  08/14/09   IMPLANTATION OF SINGLE CHAMBER PULSE  GENERATING SYSTEM;   MODEL ADSR01, SERIAL # [id number] H ADAPTA   INSERTION OF MESH Right 04/02/2017   Procedure: INSERTION OF MESH;  Surgeon: Alphonsa Overall, MD;  Location: [facility];  Service: General;  Laterality: Right;   pace maker     PPM GENERATOR CHANGEOUT N/A 07/23/2018   Procedure: PPM GENERATOR CHANGEOUT;  Surgeon: Constance Haw, MD;  Location: [facility];  Service: Cardiovascular;  Laterality: N/A;   THYROIDECTOMY, PARTIAL  1960   benign nodule   TONSILLECTOMY     TOTAL  HIP ARTHROPLASTY Left 07/01/2019   Procedure: ANTERIOR TOTAL HIP ARTHROPLASTY;  Surgeon: Mcarthur Rossetti, MD;  Location: [facility];  Service: Orthopedics;  Laterality: Left;   US ECHOCARDIOGRAPHY  07/26/2009   EF 55-60%    Current Outpatient Medications  Medication Sig Dispense Refill   amLODipine (NORVASC) 5 MG tablet Take 5 mg by mouth daily.  3   amLODipine (NORVASC) 5 MG tablet Take 5 mg by mouth daily.     hydrochlorothiazide (,MICROZIDE/HYDRODIURIL,) 12.5 MG capsule Take 12.5 mg by mouth daily with breakfast.      HYDROcodone-acetaminophen  (NORCO/VICODIN) 5-325 MG tablet Take 1-2 tablets by mouth every 6 (six) hours as needed for moderate pain (pain score 4-6). 40 tablet 0   magnesium hydroxide (MILK OF MAGNESIA) 400 MG/5ML suspension Take 15 mLs by mouth daily as needed for mild constipation.     polyethylene glycol (MIRALAX / GLYCOLAX) 17 g packet Take 17 g by mouth daily as needed for mild constipation.     telmisartan (MICARDIS) 80 MG tablet Take 80 mg by mouth daily.     warfarin (COUMADIN) 2 MG tablet Take 1-2 mg by mouth every evening. Takes 1 mg in the evening everyday except on Monday's take 2 mg in the evening     warfarin (COUMADIN) 2 MG tablet Take 2 mg by mouth See admin instructions. 1mg  daily, except on Mondays - take 2mg  as directed.     No current facility-administered medications for this visit.    No Known Allergies  Review of Systems negative except from HPI and PMH  Physical Exam There were no vitals taken for this visit. Well developed and nourished in no acute distress HENT normal Neck supple with JVP-flat Clear Regular rate and rhythm, no murmurs or gallops Abd-soft with active BS No Clubbing cyanosis edema Skin-warm and dry A & Oriented  Grossly normal sensory and motor function   ECG atrial fibrillation with complete heart block and V pacing @ 67  EKG Sinus *** Rate  Assessment and  Plan  Complete heart block stable post pacing  Atrial fibrillation-permanent   pacemaker-Medtronic The patient's device was interrogated.  The information was reviewed. No changes were made in the programming.     Hypertension  BP well controlled  On Anticoagulation;  No bleeding issues   Will check labs on diuretics and anticoagulation    I,Tinashe Williams,acting as a scribe for Virl Axe, MD.,have documented all relevant documentation on the behalf of Virl Axe, MD,as directed by  Virl Axe, MD while in the presence of Virl Axe, MD.    ***

## 2021-05-09 ENCOUNTER — Ambulatory Visit: Payer: Medicare Other

## 2021-05-09 DIAGNOSIS — I4821 Permanent atrial fibrillation: Secondary | ICD-10-CM

## 2021-05-09 LAB — CUP PACEART REMOTE DEVICE CHECK
Battery Remaining Longevity: 99 mo
Battery Voltage: 3 V
Brady Statistic RV Percent Paced: 99.31 %
Date Time Interrogation Session: 20230222184112
Implantable Lead Implant Date: 20110531
Implantable Lead Location: 753860
Implantable Lead Model: 4471
Implantable Lead Serial Number: 484940
Implantable Pulse Generator Implant Date: 20200508
Lead Channel Impedance Value: 266 Ohm
Lead Channel Impedance Value: 304 Ohm
Lead Channel Pacing Threshold Amplitude: 0.875 V
Lead Channel Pacing Threshold Pulse Width: 0.4 ms
Lead Channel Sensing Intrinsic Amplitude: 5.375 mV
Lead Channel Sensing Intrinsic Amplitude: 5.375 mV
Lead Channel Setting Pacing Amplitude: 2.5 V
Lead Channel Setting Pacing Pulse Width: 0.4 ms
Lead Channel Setting Sensing Sensitivity: 1.2 mV

## 2021-05-14 NOTE — Progress Notes (Signed)
Remote pacemaker transmission.   

## 2021-06-26 ENCOUNTER — Ambulatory Visit: Payer: Medicare Other | Admitting: Orthopaedic Surgery

## 2021-06-26 ENCOUNTER — Ambulatory Visit (INDEPENDENT_AMBULATORY_CARE_PROVIDER_SITE_OTHER): Payer: Medicare Other

## 2021-06-26 ENCOUNTER — Encounter: Payer: Self-pay | Admitting: Orthopaedic Surgery

## 2021-06-26 DIAGNOSIS — M25562 Pain in left knee: Secondary | ICD-10-CM | POA: Diagnosis not present

## 2021-06-26 DIAGNOSIS — M1712 Unilateral primary osteoarthritis, left knee: Secondary | ICD-10-CM

## 2021-06-26 DIAGNOSIS — G8929 Other chronic pain: Secondary | ICD-10-CM

## 2021-06-26 MED ORDER — METHYLPREDNISOLONE ACETATE 40 MG/ML IJ SUSP
40.0000 mg | INTRAMUSCULAR | Status: AC | PRN
  Administered 2021-06-26: 40 mg via INTRA_ARTICULAR

## 2021-06-26 MED ORDER — LIDOCAINE HCL 1 % IJ SOLN
3.0000 mL | INTRAMUSCULAR | Status: AC | PRN
  Administered 2021-06-26: 3 mL

## 2021-06-26 NOTE — Progress Notes (Signed)
? ?Office Visit Note ?  ?Patient: Manuel Lewis           ?Date of Birth: 1921-11-16           ?MRN: [id number] ?Visit Date: 06/26/2021 ?             ?Requested by: [name], MD ?[address] [place] ?[address] ?[place],  [place] [address] ?PCP: [name], MD ? ? ?Assessment & Plan: ?Visit Diagnoses:  ?1. Chronic pain of left knee   ?2. Unilateral primary osteoarthritis, left knee   ? ? ?Plan:   I was able to aspirate 75 cc of fluid off of his left knee and then place a steroid injection in the left knee.  He has done well with hyaluronic acid in the past for treating the pain from osteoarthritis of the left knee.  We will order that is the next step for his left knee. ? ?Follow-Up Instructions: No follow-ups on file.  ? ?Orders:  ?Orders Placed This Encounter  ?Procedures  ? Large Joint Inj  ? XR Knee 1-2 Views Left  ? ?No orders of the defined types were placed in this encounter. ? ? ? ? Procedures: ?Large Joint Inj: L knee on 06/26/2021 4:18 PM ?Indications: diagnostic evaluation and pain ?Details: 22 G 1.5 in needle, superolateral approach ? ?Arthrogram: No ? ?Medications: 3 mL lidocaine 1 %; 40 mg methylPREDNISolone acetate 40 MG/ML ?Outcome: tolerated well, no immediate complications ?Procedure, treatment alternatives, risks and benefits explained, specific risks discussed. Consent was given by the patient. Immediately prior to procedure a time out was called to verify the correct patient, procedure, equipment, support staff and site/side marked as required. Patient was prepped and draped in the usual sterile fashion.  ? ? ? ? ?Clinical Data: ?No additional findings. ? ? ?Subjective: ?Chief Complaint  ?Patient presents with  ? Left Knee - Pain  ?It has been about a year since have seen Manuel Lewis.  He is 89 now.  He has well-documented severe arthritis in his left knee.  A year ago I did aspirate fluid off the knee and placed a steroid injection in the knee.  In December 2021 he did have hyaluronic acid in  that knee.  He says the knee has been hurting him quite a bit recently.  His son who is a physician that I know well is with him today. ? ?HPI ? ?Review of Systems ?There is been no recent chest pain, shortness of breath, fever, chills ? ?Objective: ?Vital Signs: There were no vitals taken for this visit. ? ?Physical Exam ?He is alert and orient x3 and in no acute distress ?Ortho Exam ?Examination of his left knee does show a large effusion with painful range of motion.  There is no evidence of infection. ?Specialty Comments:  ?No specialty comments available. ? ?Imaging: ?XR Knee 1-2 Views Left ? ?Result Date: 06/26/2021 ?2 views of the left knee showed no acute findings.  There is tricompartmental arthritis with joint space narrowing in all 3 compartments and osteophytes.  ? ? ?PMFS History: ?Patient Active Problem List  ? Diagnosis Date Noted  ? Unilateral primary osteoarthritis, left knee 02/23/2020  ? 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 Degenerative retinal drusen of both eyes 08/09/2019  ? 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 History of vitrectomy 08/09/2019  ? Vitreomacular adhesion of left eye 08/09/2019  ? Atrial fibrillation ([place]) 07/01/2019  ?  Pacemaker 07/01/2019  ? Essential hypertension 07/01/2019  ? Normocytic normochromic anemia 07/01/2019  ? Hip fracture ([place]) 07/01/2019  ? Closed left hip fracture, initial encounter ([place]) 06/30/2019  ? Common bile duct (CBD) obstruction   ? Abnormal cholangiogram   ? Gallstones   ? Elevated LFTs   ? Acute cholecystitis 05/16/2016  ? Edema 06/30/2012  ? Hypertension 08/06/2010  ? Atrial fibrillation ([place]) 08/06/2010  ? Bradycardia 08/06/2010  ? Pacemaker-Single chamber-MDT 08/06/2010  ? Complication of anesthesia 03/17/1996  ? ?Past Medical History:  ?Diagnosis Date  ? Atrial fibrillation ([place])   ? Chronic a-fib ([place])   ?  since early 1990's with profound bradycardia and pauses; s/p  implantation  of Medtronic Adapta [id number], serial # [id number]  ? Complication of anesthesia 1998  ? bradycardia  ? Dysrhythmia   ? Hearing loss   ? Hypertension 1990  ? Hypertension   ? Inguinal hernia   ? Macular degeneration   ? Nocturia   ? Pacemaker   ? Prostate cancer ([place]) 1998  ? with seed implantation  ?  ?Family History  ?Problem Relation Age of Onset  ? Stroke Mother   ? Kidney failure Father   ?  ?Past Surgical History:  ?Procedure Laterality Date  ? APPENDECTOMY  1941  ? CARDIOVERSION    ? IN THE 1990's  ? CATARACT EXTRACTION    ? 2000 and 2007/ h/o macular degeneration since 2009  ? CHOLECYSTECTOMY N/A 05/18/2016  ? Procedure: LAPAROSCOPIC CHOLECYSTECTOMY WITH INTRAOPERATIVE CHOLANGIOGRAM;  Surgeon: Alphonsa Overall, MD;  Location: [facility];  Service: General;  Laterality: N/A;  ? ERCP N/A 05/21/2016  ? Procedure: ENDOSCOPIC RETROGRADE CHOLANGIOPANCREATOGRAPHY (ERCP);  Surgeon: Ladene Artist, MD;  Location: [facility];  Service: Endoscopy;  Laterality: N/A;  ? FINE NEEDLE ASPIRATION Left 07/01/2019  ? Procedure: Fine Needle Aspiration of left knee;  Surgeon: Mcarthur Rossetti, MD;  Location: [facility];  Service: Orthopedics;  Laterality: Left;  ? HERNIA REPAIR    ? 1945 and 1985  ? HERNIA REPAIR  08/18/11  ? inguinal  ? INGUINAL HERNIA REPAIR  08/18/2011  ? Procedure: HERNIA REPAIR INGUINAL ADULT;  Surgeon: [name], MD;  Location: [facility];  Service: General;  Laterality: Left;  Recurrent Left inguinal hernia repair  ? INGUINAL HERNIA REPAIR Right 04/02/2017  ? Procedure: OPEN RIGHT INGUINAL HERNIA REPAIR;  Surgeon: Alphonsa Overall, MD;  Location: [facility];  Service: General;  Laterality: Right;  ? INSERT / REPLACE / REMOVE PACEMAKER  08/14/09  ? IMPLANTATION OF SINGLE CHAMBER PULSE  GENERATING SYSTEM;   MODEL ADSR01, SERIAL # [id number] H ADAPTA  ? INSERTION OF MESH Right 04/02/2017  ? Procedure: INSERTION OF MESH;  Surgeon: Alphonsa Overall, MD;  Location: [facility];  Service: General;  Laterality: Right;  ? pace maker    ? PPM GENERATOR CHANGEOUT N/A 07/23/2018  ? Procedure: PPM GENERATOR CHANGEOUT;  Surgeon: Constance Haw, MD;  Location: [facility];  Service: Cardiovascular;  Laterality: N/A;  ? THYROIDECTOMY, PARTIAL  1960  ? benign nodule  ? TONSILLECTOMY    ? TOTAL HIP ARTHROPLASTY Left 07/01/2019  ? Procedure: ANTERIOR TOTAL HIP ARTHROPLASTY;  Surgeon: Mcarthur Rossetti, MD;  Location: [facility];  Service: Orthopedics;  Laterality: Left;  ? US ECHOCARDIOGRAPHY  07/26/2009  ? EF 55-60%  ? ?Social History  ? ?Occupational History  ? Occupation: RETIRED  ?  Employer: [employer]  ?  Comment: ENGINEER  ?Tobacco Use  ? Smoking  status: Never  ? Smokeless tobacco: Never  ?Vaping Use  ? Vaping Use: Never used  ?Substance and Sexual Activity  ? Alcohol use: Not Currently  ?  Comment: 1 [employer]  ? Drug use: Not Currently  ? Sexual activity: Never  ? ? ? ? ? ? ?

## 2021-06-27 ENCOUNTER — Telehealth: Payer: Self-pay

## 2021-06-27 NOTE — Telephone Encounter (Signed)
Left knee gel injection ?

## 2021-07-02 NOTE — Telephone Encounter (Signed)
Noted  

## 2021-10-23 ENCOUNTER — Ambulatory Visit
Admission: RE | Admit: 2021-10-23 | Discharge: 2021-10-23 | Disposition: A | Discharge status: Home / Self Care | Payer: Medicare Other | Source: Ambulatory Visit | Attending: Internal Medicine | Admitting: Internal Medicine

## 2021-10-23 ENCOUNTER — Other Ambulatory Visit: Payer: Self-pay | Admitting: Internal Medicine

## 2021-10-23 DIAGNOSIS — R062 Wheezing: Secondary | ICD-10-CM

## 2021-12-24 ENCOUNTER — Ambulatory Visit (INDEPENDENT_AMBULATORY_CARE_PROVIDER_SITE_OTHER): Payer: Medicare Other

## 2021-12-24 DIAGNOSIS — I4821 Permanent atrial fibrillation: Secondary | ICD-10-CM | POA: Diagnosis not present

## 2021-12-26 LAB — CUP PACEART REMOTE DEVICE CHECK
Battery Remaining Longevity: 92 mo
Battery Voltage: 3 V
Brady Statistic RV Percent Paced: 99.41 %
Date Time Interrogation Session: 20231011184737
Implantable Lead Implant Date: 20110531
Implantable Lead Location: 753860
Implantable Lead Model: 4471
Implantable Lead Serial Number: 484940
Implantable Pulse Generator Implant Date: 20200508
Lead Channel Impedance Value: 285 Ohm
Lead Channel Impedance Value: 304 Ohm
Lead Channel Pacing Threshold Amplitude: 0.875 V
Lead Channel Pacing Threshold Pulse Width: 0.4 ms
Lead Channel Sensing Intrinsic Amplitude: 5.125 mV
Lead Channel Sensing Intrinsic Amplitude: 5.125 mV
Lead Channel Setting Pacing Amplitude: 2.5 V
Lead Channel Setting Pacing Pulse Width: 0.4 ms
Lead Channel Setting Sensing Sensitivity: 1.2 mV

## 2022-01-07 NOTE — Progress Notes (Signed)
Remote pacemaker transmission.   

## 2022-06-12 ENCOUNTER — Encounter (INDEPENDENT_AMBULATORY_CARE_PROVIDER_SITE_OTHER): Payer: Medicare Other | Admitting: Ophthalmology

## 2022-06-18 ENCOUNTER — Encounter (HOSPITAL_COMMUNITY): Payer: Self-pay | Admitting: Internal Medicine

## 2022-06-18 ENCOUNTER — Inpatient Hospital Stay (HOSPITAL_COMMUNITY)
Admission: EM | Admit: 2022-06-18 | Discharge: 2022-06-20 | DRG: 315 | Disposition: A | Discharge status: Home / Self Care | Payer: Medicare Other | Attending: Internal Medicine | Admitting: Internal Medicine

## 2022-06-18 ENCOUNTER — Other Ambulatory Visit: Payer: Self-pay

## 2022-06-18 ENCOUNTER — Ambulatory Visit (INDEPENDENT_AMBULATORY_CARE_PROVIDER_SITE_OTHER): Payer: Medicare Other

## 2022-06-18 ENCOUNTER — Emergency Department (HOSPITAL_COMMUNITY): Payer: Medicare Other

## 2022-06-18 DIAGNOSIS — Z950 Presence of cardiac pacemaker: Secondary | ICD-10-CM | POA: Diagnosis not present

## 2022-06-18 DIAGNOSIS — N179 Acute kidney failure, unspecified: Secondary | ICD-10-CM | POA: Diagnosis present

## 2022-06-18 DIAGNOSIS — I4891 Unspecified atrial fibrillation: Secondary | ICD-10-CM | POA: Diagnosis present

## 2022-06-18 DIAGNOSIS — I482 Chronic atrial fibrillation, unspecified: Secondary | ICD-10-CM | POA: Diagnosis present

## 2022-06-18 DIAGNOSIS — T465X5A Adverse effect of other antihypertensive drugs, initial encounter: Secondary | ICD-10-CM | POA: Diagnosis present

## 2022-06-18 DIAGNOSIS — I4821 Permanent atrial fibrillation: Secondary | ICD-10-CM

## 2022-06-18 DIAGNOSIS — F0390 Unspecified dementia without behavioral disturbance: Secondary | ICD-10-CM | POA: Diagnosis present

## 2022-06-18 DIAGNOSIS — Z79899 Other long term (current) drug therapy: Secondary | ICD-10-CM | POA: Diagnosis not present

## 2022-06-18 DIAGNOSIS — Z823 Family history of stroke: Secondary | ICD-10-CM | POA: Diagnosis not present

## 2022-06-18 DIAGNOSIS — R195 Other fecal abnormalities: Secondary | ICD-10-CM | POA: Diagnosis present

## 2022-06-18 DIAGNOSIS — H9190 Unspecified hearing loss, unspecified ear: Secondary | ICD-10-CM | POA: Diagnosis present

## 2022-06-18 DIAGNOSIS — R001 Bradycardia, unspecified: Secondary | ICD-10-CM | POA: Diagnosis present

## 2022-06-18 DIAGNOSIS — I10 Essential (primary) hypertension: Secondary | ICD-10-CM | POA: Diagnosis present

## 2022-06-18 DIAGNOSIS — E860 Dehydration: Secondary | ICD-10-CM | POA: Diagnosis present

## 2022-06-18 DIAGNOSIS — K922 Gastrointestinal hemorrhage, unspecified: Secondary | ICD-10-CM

## 2022-06-18 DIAGNOSIS — Z841 Family history of disorders of kidney and ureter: Secondary | ICD-10-CM | POA: Diagnosis not present

## 2022-06-18 DIAGNOSIS — R55 Syncope and collapse: Secondary | ICD-10-CM | POA: Diagnosis present

## 2022-06-18 DIAGNOSIS — E876 Hypokalemia: Secondary | ICD-10-CM | POA: Diagnosis present

## 2022-06-18 DIAGNOSIS — I959 Hypotension, unspecified: Secondary | ICD-10-CM | POA: Diagnosis present

## 2022-06-18 DIAGNOSIS — Z8546 Personal history of malignant neoplasm of prostate: Secondary | ICD-10-CM | POA: Diagnosis not present

## 2022-06-18 DIAGNOSIS — E785 Hyperlipidemia, unspecified: Secondary | ICD-10-CM | POA: Diagnosis present

## 2022-06-18 DIAGNOSIS — Z96642 Presence of left artificial hip joint: Secondary | ICD-10-CM | POA: Diagnosis present

## 2022-06-18 DIAGNOSIS — D649 Anemia, unspecified: Secondary | ICD-10-CM | POA: Diagnosis present

## 2022-06-18 DIAGNOSIS — R42 Dizziness and giddiness: Secondary | ICD-10-CM | POA: Diagnosis not present

## 2022-06-18 DIAGNOSIS — Z888 Allergy status to other drugs, medicaments and biological substances status: Secondary | ICD-10-CM

## 2022-06-18 DIAGNOSIS — Z7901 Long term (current) use of anticoagulants: Secondary | ICD-10-CM | POA: Diagnosis not present

## 2022-06-18 LAB — CBC
HCT: 30.9 % — ABNORMAL LOW (ref 39.0–52.0)
Hemoglobin: 10.1 g/dL — ABNORMAL LOW (ref 13.0–17.0)
MCH: 31.3 pg (ref 26.0–34.0)
MCHC: 32.7 g/dL (ref 30.0–36.0)
MCV: 95.7 fL (ref 80.0–100.0)
Platelets: 237 10*3/uL (ref 150–400)
RBC: 3.23 MIL/uL — ABNORMAL LOW (ref 4.22–5.81)
RDW: 14.3 % (ref 11.5–15.5)
WBC: 5.6 10*3/uL (ref 4.0–10.5)
nRBC: 0 % (ref 0.0–0.2)

## 2022-06-18 LAB — SAMPLE TO BLOOD BANK

## 2022-06-18 LAB — BASIC METABOLIC PANEL
Anion gap: 8 (ref 5–15)
BUN: 31 mg/dL — ABNORMAL HIGH (ref 8–23)
CO2: 24 mmol/L (ref 22–32)
Calcium: 9.9 mg/dL (ref 8.9–10.3)
Chloride: 105 mmol/L (ref 98–111)
Creatinine, Ser: 1.42 mg/dL — ABNORMAL HIGH (ref 0.61–1.24)
GFR, Estimated: 45 mL/min — ABNORMAL LOW (ref >60)
Glucose, Bld: 116 mg/dL — ABNORMAL HIGH (ref 70–99)
Potassium: 3.3 mmol/L — ABNORMAL LOW (ref 3.5–5.1)
Sodium: 137 mmol/L (ref 135–145)

## 2022-06-18 LAB — TROPONIN I (HIGH SENSITIVITY)
Troponin I (High Sensitivity): 15 ng/L (ref <18)
Troponin I (High Sensitivity): 13 ng/L (ref <18)

## 2022-06-18 LAB — CBC WITH DIFFERENTIAL/PLATELET
Abs Immature Granulocytes: 0.02 10*3/uL (ref 0.00–0.07)
Basophils Absolute: 0 10*3/uL (ref 0.0–0.1)
Basophils Relative: 1 %
Eosinophils Absolute: 0.1 10*3/uL (ref 0.0–0.5)
Eosinophils Relative: 2 %
HCT: 35.6 % — ABNORMAL LOW (ref 39.0–52.0)
Hemoglobin: 11.5 g/dL — ABNORMAL LOW (ref 13.0–17.0)
Immature Granulocytes: 0 %
Lymphocytes Relative: 14 %
Lymphs Abs: 0.9 10*3/uL (ref 0.7–4.0)
MCH: 30.7 pg (ref 26.0–34.0)
MCHC: 32.3 g/dL (ref 30.0–36.0)
MCV: 95.2 fL (ref 80.0–100.0)
Monocytes Absolute: 0.6 10*3/uL (ref 0.1–1.0)
Monocytes Relative: 9 %
Neutro Abs: 4.9 10*3/uL (ref 1.7–7.7)
Neutrophils Relative %: 74 %
Platelets: 283 10*3/uL (ref 150–400)
RBC: 3.74 MIL/uL — ABNORMAL LOW (ref 4.22–5.81)
RDW: 14.2 % (ref 11.5–15.5)
WBC: 6.5 10*3/uL (ref 4.0–10.5)
nRBC: 0 % (ref 0.0–0.2)

## 2022-06-18 LAB — CBG MONITORING, ED: Glucose-Capillary: 112 mg/dL — ABNORMAL HIGH (ref 70–99)

## 2022-06-18 LAB — POC OCCULT BLOOD, ED: Fecal Occult Bld: POSITIVE — AB

## 2022-06-18 LAB — MAGNESIUM: Magnesium: 1.9 mg/dL (ref 1.7–2.4)

## 2022-06-18 MED ORDER — PANTOPRAZOLE 80MG IVPB - SIMPLE MED
80.0000 mg | Freq: Once | INTRAVENOUS | Status: AC
  Administered 2022-06-18: 80 mg via INTRAVENOUS
  Filled 2022-06-18: qty 80

## 2022-06-18 MED ORDER — QUETIAPINE FUMARATE 25 MG PO TABS
25.0000 mg | ORAL_TABLET | Freq: Every day | ORAL | Status: DC
  Administered 2022-06-18 – 2022-06-19 (×2): 25 mg via ORAL
  Filled 2022-06-18 (×2): qty 1

## 2022-06-18 MED ORDER — ONDANSETRON HCL 4 MG PO TABS: 4.0000 mg | ORAL_TABLET | Freq: Four times a day (QID) | ORAL | Status: DC | PRN

## 2022-06-18 MED ORDER — LACTATED RINGERS IV BOLUS
500.0000 mL | Freq: Once | INTRAVENOUS | Status: AC
  Administered 2022-06-18: 500 mL via INTRAVENOUS

## 2022-06-18 MED ORDER — LACTATED RINGERS IV SOLN: INTRAVENOUS | Status: DC

## 2022-06-18 MED ORDER — ACETAMINOPHEN 650 MG RE SUPP: 650.0000 mg | Freq: Four times a day (QID) | RECTAL | Status: DC | PRN

## 2022-06-18 MED ORDER — ACETAMINOPHEN 325 MG PO TABS: 650.0000 mg | ORAL_TABLET | Freq: Four times a day (QID) | ORAL | Status: DC | PRN

## 2022-06-18 MED ORDER — PANTOPRAZOLE SODIUM 40 MG IV SOLR: 40.0000 mg | INTRAVENOUS | Status: DC

## 2022-06-18 MED ORDER — POTASSIUM CHLORIDE CRYS ER 20 MEQ PO TBCR
40.0000 meq | EXTENDED_RELEASE_TABLET | Freq: Once | ORAL | Status: AC
  Administered 2022-06-18: 40 meq via ORAL
  Filled 2022-06-18: qty 2

## 2022-06-18 MED ORDER — PANTOPRAZOLE SODIUM 40 MG IV SOLR: 40.0000 mg | Freq: Two times a day (BID) | INTRAVENOUS | Status: DC

## 2022-06-18 MED ORDER — ONDANSETRON HCL 4 MG/2ML IJ SOLN: 4.0000 mg | Freq: Four times a day (QID) | INTRAMUSCULAR | Status: DC | PRN

## 2022-06-18 NOTE — H&P (Signed)
History and Physical    Patient: Manuel Lewis [id number] DOB: [birth year] DOA: 06/18/2022 DOS: the patient was seen and examined on 06/18/2022 PCP: [name], MD  Patient coming from: {Point_of_Origin:[address]}  Chief Complaint:  Chief Complaint  Patient presents with   Hypotension   HPI: Manuel Lewis is a 89 y.o. male with medical history significant of ***  Review of Systems: {ROS_Text:26778} Past Medical History:  Diagnosis Date   Atrial fibrillation ([place])    Chronic a-fib ([place])    since early 1990's with profound bradycardia and pauses; s/p  implantation  of Medtronic Adapta ADSR01, serial # [id number]   Complication of anesthesia 1998   bradycardia   Dysrhythmia    Hearing loss    Hypertension 1990   Hypertension    Inguinal hernia    Macular degeneration    Nocturia    Pacemaker    Prostate cancer ([place]) 1998   with seed implantation   Past Surgical History:  Procedure Laterality Date   APPENDECTOMY  1941   CARDIOVERSION     IN THE 1990's   CATARACT EXTRACTION     2000 and 2007/ h/o macular degeneration since 2009   CHOLECYSTECTOMY N/A 05/18/2016   Procedure: LAPAROSCOPIC CHOLECYSTECTOMY WITH INTRAOPERATIVE CHOLANGIOGRAM;  Surgeon: Alphonsa Overall, MD;  Location: [facility];  Service: General;  Laterality: N/A;   ERCP N/A 05/21/2016   Procedure: ENDOSCOPIC RETROGRADE CHOLANGIOPANCREATOGRAPHY (ERCP);  Surgeon: Ladene Artist, MD;  Location: [facility];  Service: Endoscopy;  Laterality: N/A;   FINE NEEDLE ASPIRATION Left 07/01/2019   Procedure: Fine Needle Aspiration of left knee;  Surgeon: Mcarthur Rossetti, MD;  Location: [facility];  Service: Orthopedics;  Laterality: Left;   HERNIA REPAIR     1945 and 1985   HERNIA REPAIR  08/18/11   inguinal   INGUINAL HERNIA REPAIR  08/18/2011   Procedure: HERNIA REPAIR INGUINAL ADULT;  Surgeon: [name], MD;  Location: [facility];  Service: General;  Laterality: Left;  Recurrent Left inguinal hernia repair   INGUINAL HERNIA  REPAIR Right 04/02/2017   Procedure: OPEN RIGHT INGUINAL HERNIA REPAIR;  Surgeon: Alphonsa Overall, MD;  Location: [facility];  Service: General;  Laterality: Right;   INSERT / REPLACE / REMOVE PACEMAKER  08/14/09   IMPLANTATION OF SINGLE CHAMBER PULSE  GENERATING SYSTEM;   MODEL ADSR01, SERIAL # [id number] H ADAPTA   INSERTION OF MESH Right 04/02/2017   Procedure: INSERTION OF MESH;  Surgeon: Alphonsa Overall, MD;  Location: [facility];  Service: General;  Laterality: Right;   pace maker     PPM GENERATOR CHANGEOUT N/A 07/23/2018   Procedure: PPM GENERATOR CHANGEOUT;  Surgeon: Constance Haw, MD;  Location: [facility];  Service: Cardiovascular;  Laterality: N/A;   THYROIDECTOMY, PARTIAL  1960   benign nodule   TONSILLECTOMY     TOTAL HIP ARTHROPLASTY Left 07/01/2019   Procedure: ANTERIOR TOTAL HIP ARTHROPLASTY;  Surgeon: Mcarthur Rossetti, MD;  Location: [facility];  Service: Orthopedics;  Laterality: Left;   US ECHOCARDIOGRAPHY  07/26/2009   EF 55-60%   Social History:  reports that he has never smoked. He has never used smokeless tobacco. He reports that he does not currently use alcohol. He reports that he does not currently use drugs.  No Known Allergies  Family History  Problem Relation Age of Onset   Stroke Mother    Kidney failure Father     Prior to Admission medications   Medication Sig Start  Date End Date Taking? Authorizing Provider  amLODipine (NORVASC) 5 MG tablet Take 5 mg by mouth daily. 12/16/17   [provider]  amLODipine (NORVASC) 5 MG tablet Take 5 mg by mouth daily. 03/31/19   [provider]  hydrochlorothiazide (,MICROZIDE/HYDRODIURIL,) 12.5 MG capsule Take 12.5 mg by mouth daily with breakfast.     [provider]  HYDROcodone-acetaminophen (NORCO/VICODIN) 5-325 MG tablet Take 1-2 tablets by mouth every 6 (six) hours as needed for moderate pain (pain score 4-6). 07/04/19   Mcarthur Rossetti, MD   magnesium hydroxide (MILK OF MAGNESIA) 400 MG/5ML suspension Take 15 mLs by mouth daily as needed for mild constipation.    [provider]  polyethylene glycol (MIRALAX / GLYCOLAX) 17 g packet Take 17 g by mouth daily as needed for mild constipation.    [provider]  telmisartan (MICARDIS) 80 MG tablet Take 80 mg by mouth daily.    [provider]  warfarin (COUMADIN) 2 MG tablet Take 1-2 mg by mouth every evening. Takes 1 mg in the evening everyday except on Monday's take 2 mg in the evening    [provider]  warfarin (COUMADIN) 2 MG tablet Take 2 mg by mouth See admin instructions. 1mg  daily, except on Mondays - take 2mg  as directed. 06/06/19   [provider]    Physical Exam: Vitals:   06/18/22 1430 06/18/22 1610 06/18/22 1710 06/18/22 1830  BP: 139/79  (!) 147/82   Pulse: 84  79   Resp: 16  19   Temp: 98.3 F (36.8 C)   97.7 F (36.5 C)  TempSrc: Oral   Oral  SpO2: 99%  99%   Weight:  74.8 kg    Height:  5\' 8"  (1.727 m)     *** Data Reviewed: {Tip this will not be part of the note when signed- Document your independent interpretation of telemetry tracing, EKG, lab, Radiology test or any other diagnostic tests. Add any new diagnostic test ordered today. (Optional):26781} {Results:26384}  Assessment and Plan: No notes have been filed under this hospital service. Service: Hospitalist     Advance Care Planning:   Code Status: Full Code ***  Consults: ***  Family Communication: ***  Severity of Illness: {Observation/Inpatient:21159}  AuthorBarbette Merino, MD 06/18/2022 7:43 PM  For on call review [email or web address].

## 2022-06-18 NOTE — ED Triage Notes (Signed)
Pt BIB EMS from home with c/o SBP in 70's. Denies dizziness, headaches, N/V. A&Ox1. Hx of dementia.   BP 122/68 HR 84 CBG 117 97% RA RR18

## 2022-06-18 NOTE — ED Notes (Signed)
Pacemaker results: No arrhythmias, battery and lead measurement is within range, device is functioning as programmed.

## 2022-06-18 NOTE — ED Provider Notes (Signed)
[facility] Provider Note   CSN: [id number] Arrival date & time: 06/18/22  1422     History  Chief Complaint  Patient presents with   Hypotension    Manuel Lewis is a 89 y.o. male.  89 year old male with a history of dementia, atrial fibrillation on Xarelto, bradycardia status post pacemaker, and hypertension who presents to the emergency department with presyncopal event at home.  Patient was under the care of his caretaker when at 11:45 PM episode where he appeared flushed and said that he felt warm.  Also started becoming antsy and reported that he felt that he was going to pass out.  His home health aide took his blood pressure and it was 63/46.  Is currently on hydrochlorothiazide, amlodipine, and 10 losartan for hypertension with no recent dose adjustments.  Without intervention his blood pressure improved to 104/60 on EMS arrival.  He denied any chest pain, shortness of breath, dizziness at this time.  Also is undergoing workup for anemia that is new and has been started on iron.  No recent bloody stools.       Home Medications Prior to Admission medications   Medication Sig Start Date End Date Taking? Authorizing Provider  amLODipine (NORVASC) 5 MG tablet Take 5 mg by mouth daily. 12/16/17   [provider]  amLODipine (NORVASC) 5 MG tablet Take 5 mg by mouth daily. 03/31/19   [provider]  hydrochlorothiazide (,MICROZIDE/HYDRODIURIL,) 12.5 MG capsule Take 12.5 mg by mouth daily with breakfast.     [provider]  HYDROcodone-acetaminophen (NORCO/VICODIN) 5-325 MG tablet Take 1-2 tablets by mouth every 6 (six) hours as needed for moderate pain (pain score 4-6). 07/04/19   Mcarthur Rossetti, MD  magnesium hydroxide (MILK OF MAGNESIA) 400 MG/5ML suspension Take 15 mLs by mouth daily as needed for mild constipation.    [provider]  polyethylene glycol (MIRALAX / GLYCOLAX) 17 g packet  Take 17 g by mouth daily as needed for mild constipation.    [provider]  telmisartan (MICARDIS) 80 MG tablet Take 80 mg by mouth daily.    [provider]  warfarin (COUMADIN) 2 MG tablet Take 1-2 mg by mouth every evening. Takes 1 mg in the evening everyday except on Monday's take 2 mg in the evening    [provider]  warfarin (COUMADIN) 2 MG tablet Take 2 mg by mouth See admin instructions. 1mg  daily, except on Mondays - take 2mg  as directed. 06/06/19   [provider]      Allergies    Patient has no known allergies.    Review of Systems   Review of Systems  Physical Exam Updated Vital Signs BP (!) 163/73 (BP Location: Left Arm)   Pulse 63   Temp 98 F (36.7 C) (Oral)   Resp 18   Ht 5\' 8"  (1.727 m)   Wt 72.1 kg   SpO2 98%   BMI 24.17 kg/m  Physical Exam Vitals and nursing note reviewed.  Constitutional:      General: He is not in acute distress.    Appearance: He is well-developed.  HENT:     Head: Normocephalic and atraumatic.     Right Ear: External ear normal.     Left Ear: External ear normal.     Nose: Nose normal.  Eyes:     Extraocular Movements: Extraocular movements intact.     Conjunctiva/sclera: Conjunctivae normal.     Pupils:  Pupils are equal, round, and reactive to light.  Cardiovascular:     Rate and Rhythm: Normal rate and regular rhythm.     Heart sounds: Normal heart sounds.  Pulmonary:     Effort: Pulmonary effort is normal. No respiratory distress.     Breath sounds: Normal breath sounds.  Abdominal:     General: There is no distension.     Palpations: Abdomen is soft. There is no mass.     Tenderness: There is no abdominal tenderness. There is no guarding.  Musculoskeletal:     Cervical back: Normal range of motion and neck supple.     Right lower leg: No edema.     Left lower leg: No edema.  Skin:    General: Skin is warm and dry.  Neurological:     Mental Status: He is alert. Mental status is at  baseline.  Psychiatric:        Mood and Affect: Mood normal.        Behavior: Behavior normal.     ED Results / Procedures / Treatments   Labs (all labs ordered are listed, but only abnormal results are displayed) Labs Reviewed  BASIC METABOLIC PANEL - Abnormal; Notable for the following components:      Result Value   Potassium 3.3 (*)    Glucose, Bld 116 (*)    BUN 31 (*)    Creatinine, Ser 1.42 (*)    GFR, Estimated 45 (*)    All other components within normal limits  CBC WITH DIFFERENTIAL/PLATELET - Abnormal; Notable for the following components:   RBC 3.74 (*)    Hemoglobin 11.5 (*)    HCT 35.6 (*)    All other components within normal limits  CBC - Abnormal; Notable for the following components:   RBC 3.23 (*)    Hemoglobin 10.1 (*)    HCT 30.9 (*)    All other components within normal limits  CBG MONITORING, ED - Abnormal; Notable for the following components:   Glucose-Capillary 112 (*)    All other components within normal limits  POC OCCULT BLOOD, ED - Abnormal; Notable for the following components:   Fecal Occult Bld POSITIVE (*)    All other components within normal limits  MAGNESIUM  CBC  CBC  COMPREHENSIVE METABOLIC PANEL  SAMPLE TO BLOOD BANK  TROPONIN I (HIGH SENSITIVITY)  TROPONIN I (HIGH SENSITIVITY)    EKG EKG Interpretation  Date/Time:  Wednesday June 18 2022 16:00:19 EDT Ventricular Rate:  61 PR Interval:  279 QRS Duration: 186 QT Interval:  494 QTC Calculation: 498 R Axis:   41 Text Interpretation: Ventricular-paced rhythm No further analysis attempted due to paced rhythm When compared to prior atrial flutter no longer present Confirmed by Margaretmary Eddy [phone]) on 06/18/2022 4:19:57 PM  Radiology DG Chest Port 1 View  Result Date: 06/18/2022 CLINICAL DATA:  Presyncope.  Hypotension. EXAM: PORTABLE CHEST 1 VIEW COMPARISON:  Radiographs 10/23/2021 and 06/30/2019. FINDINGS: 1514 hours. Right subclavian pacemaker lead is unchanged at the  level of the anterior wall of the right ventricle. The heart is enlarged but stable. The lungs appear clear. There is no pleural effusion or pneumothorax. No acute osseous findings are evident. IMPRESSION: Cardiomegaly without evidence of acute cardiopulmonary process. Pacemaker lead unchanged in position. Electronically Signed   By: Richardean Sale M.D.   On: 06/18/2022 16:03    Procedures Procedures   Medications Ordered in ED Medications  lactated ringers infusion ( Intravenous Infusion Verify 06/18/22 2200)  acetaminophen (TYLENOL) tablet 650 mg (has no administration in time range)    Or  acetaminophen (TYLENOL) suppository 650 mg (has no administration in time range)  ondansetron (ZOFRAN) tablet 4 mg (has no administration in time range)    Or  ondansetron (ZOFRAN) injection 4 mg (has no administration in time range)  pantoprazole (PROTONIX) injection 40 mg (has no administration in time range)  QUEtiapine (SEROQUEL) tablet 25 mg (25 mg Oral Given 06/18/22 2129)  lactated ringers bolus 500 mL (0 mLs Intravenous Stopped 06/18/22 1729)  potassium chloride SA (KLOR-CON M) CR tablet 40 mEq (40 mEq Oral Given 06/18/22 1727)  lactated ringers bolus 500 mL (500 mLs Intravenous New Bag/Given 06/18/22 1727)  pantoprazole (PROTONIX) 80 mg /NS 100 mL IVPB (0 mg Intravenous Stopped 06/18/22 1901)    ED Course/ Medical Decision Making/ A&P                             Medical Decision Making Amount and/or Complexity of Data Reviewed Labs: ordered. Radiology: ordered. ECG/medicine tests: ordered.  Risk Prescription drug management. Decision regarding hospitalization.   Manuel Lewis is a 89 y.o. male with comorbidities that complicate the patient evaluation including dementia, atrial fibrillation on Xarelto, bradycardia status post pacemaker, and hypertension who presents to the emergency department with presyncopal event at home.    Initial Ddx:  Arrhythmia, MI, GI bleed, vasovagal syncope,  dehydration/orthostasis  MDM:  Unclear exactly what caused the patient's symptoms.  Will obtain EKG, troponin, and interrogate the patient's pacemaker to evaluate for cardiac causes.  Per son has been worked up for GI bleed and is on Xarelto so we will obtain Hemoccult and evaluate CBC.  Will give him IV fluids as well in case of dehydration.  Plan:  Labs Hemoccult Troponin EKG Chest x-ray Pacemaker interrogation  ED Summary/Re-evaluation:  Patient underwent the above workup.  Hemoglobin was improved from priors at 11.5.  Hemoccult however was positive patient was started on Protonix and discussed with [facility] who recommended reaching out to [facility] since they had not seen the patient in a long period of time.  Patient had possible AKI as well as low potassium on his lab work as well.  With his hypotension and lab findings along with his Hemoccult positive stool admitted to medicine for observation and evaluation of possible GI bleed.  Still awaiting device interrogation at this time.  This patient presents to the ED for concern of complaints listed in HPI, this involves an extensive number of treatment options, and is a complaint that carries with it a high risk of complications and morbidity. Disposition including potential need for admission considered.   Dispo: Admit to Floor  Additional history obtained from son Records reviewed Outpatient Clinic Notes The following labs were independently interpreted: Chemistry and show AKI I independently reviewed the following imaging with scope of interpretation limited to determining acute life threatening conditions related to emergency care: Chest x-ray and agree with the radiologist interpretation with the following exceptions: None I personally reviewed and interpreted cardiac monitoring:  Paced rhythm I personally reviewed and interpreted the pt's EKG: see above for interpretation  I have reviewed the patients home medications and made  adjustments as needed Consults: Gastroenterology and Hospitalist Social Determinants of health:  Elderly   Final Clinical Impression(s) / ED Diagnoses Final diagnoses:  Postural dizziness with presyncope  AKI (acute kidney injury)  Gastrointestinal hemorrhage, unspecified gastrointestinal hemorrhage type  Rx / DC Orders ED Discharge Orders     None         Fransico Meadow, MD 06/18/22 2328

## 2022-06-19 ENCOUNTER — Encounter (HOSPITAL_COMMUNITY): Payer: Self-pay | Admitting: Internal Medicine

## 2022-06-19 DIAGNOSIS — I10 Essential (primary) hypertension: Secondary | ICD-10-CM | POA: Diagnosis not present

## 2022-06-19 DIAGNOSIS — R55 Syncope and collapse: Secondary | ICD-10-CM

## 2022-06-19 DIAGNOSIS — N179 Acute kidney failure, unspecified: Secondary | ICD-10-CM | POA: Diagnosis not present

## 2022-06-19 DIAGNOSIS — I959 Hypotension, unspecified: Secondary | ICD-10-CM | POA: Diagnosis not present

## 2022-06-19 LAB — CUP PACEART REMOTE DEVICE CHECK
Battery Remaining Longevity: 89 mo
Battery Voltage: 2.99 V
Brady Statistic RV Percent Paced: 99 %
Date Time Interrogation Session: 20240403165713
Implantable Lead Connection Status: 753985
Implantable Lead Implant Date: 20110531
Implantable Lead Location: 753860
Implantable Lead Model: 4471
Implantable Lead Serial Number: 484940
Implantable Pulse Generator Implant Date: 20200508
Lead Channel Impedance Value: 285 Ohm
Lead Channel Impedance Value: 323 Ohm
Lead Channel Pacing Threshold Amplitude: 0.75 V
Lead Channel Pacing Threshold Pulse Width: 0.4 ms
Lead Channel Sensing Intrinsic Amplitude: 5.125 mV
Lead Channel Sensing Intrinsic Amplitude: 5.125 mV
Lead Channel Setting Pacing Amplitude: 2.5 V
Lead Channel Setting Pacing Pulse Width: 0.4 ms
Lead Channel Setting Sensing Sensitivity: 1.2 mV
Zone Setting Status: 755011

## 2022-06-19 LAB — COMPREHENSIVE METABOLIC PANEL
ALT: 11 U/L (ref 0–44)
AST: 13 U/L — ABNORMAL LOW (ref 15–41)
Albumin: 3.8 g/dL (ref 3.5–5.0)
Alkaline Phosphatase: 40 U/L (ref 38–126)
Anion gap: 8 (ref 5–15)
BUN: 28 mg/dL — ABNORMAL HIGH (ref 8–23)
CO2: 22 mmol/L (ref 22–32)
Calcium: 9.4 mg/dL (ref 8.9–10.3)
Chloride: 108 mmol/L (ref 98–111)
Creatinine, Ser: 1.16 mg/dL (ref 0.61–1.24)
GFR, Estimated: 57 mL/min — ABNORMAL LOW (ref >60)
Glucose, Bld: 111 mg/dL — ABNORMAL HIGH (ref 70–99)
Potassium: 3.4 mmol/L — ABNORMAL LOW (ref 3.5–5.1)
Sodium: 138 mmol/L (ref 135–145)
Total Bilirubin: 1 mg/dL (ref 0.3–1.2)
Total Protein: 6.1 g/dL — ABNORMAL LOW (ref 6.5–8.1)

## 2022-06-19 LAB — CBC
HCT: 29.6 % — ABNORMAL LOW (ref 39.0–52.0)
HCT: 32 % — ABNORMAL LOW (ref 39.0–52.0)
HCT: 32.8 % — ABNORMAL LOW (ref 39.0–52.0)
Hemoglobin: 9.4 g/dL — ABNORMAL LOW (ref 13.0–17.0)
Hemoglobin: 10.5 g/dL — ABNORMAL LOW (ref 13.0–17.0)
Hemoglobin: 10.5 g/dL — ABNORMAL LOW (ref 13.0–17.0)
MCH: 30.9 pg (ref 26.0–34.0)
MCH: 31.4 pg (ref 26.0–34.0)
MCH: 31.6 pg (ref 26.0–34.0)
MCHC: 31.8 g/dL (ref 30.0–36.0)
MCHC: 32.8 g/dL (ref 30.0–36.0)
MCHC: 32 g/dL (ref 30.0–36.0)
MCV: 97.4 fL (ref 80.0–100.0)
MCV: 95.8 fL (ref 80.0–100.0)
MCV: 98.8 fL (ref 80.0–100.0)
Platelets: 224 10*3/uL (ref 150–400)
Platelets: 240 10*3/uL (ref 150–400)
Platelets: 250 10*3/uL (ref 150–400)
RBC: 3.04 MIL/uL — ABNORMAL LOW (ref 4.22–5.81)
RBC: 3.34 MIL/uL — ABNORMAL LOW (ref 4.22–5.81)
RBC: 3.32 MIL/uL — ABNORMAL LOW (ref 4.22–5.81)
RDW: 14.4 % (ref 11.5–15.5)
RDW: 14.6 % (ref 11.5–15.5)
RDW: 14.6 % (ref 11.5–15.5)
WBC: 5.9 10*3/uL (ref 4.0–10.5)
WBC: 6.4 10*3/uL (ref 4.0–10.5)
WBC: 6.3 10*3/uL (ref 4.0–10.5)
nRBC: 0 % (ref 0.0–0.2)
nRBC: 0 % (ref 0.0–0.2)
nRBC: 0 % (ref 0.0–0.2)

## 2022-06-19 LAB — PROTIME-INR
INR: 1.5 — ABNORMAL HIGH (ref 0.8–1.2)
Prothrombin Time: 18 seconds — ABNORMAL HIGH (ref 11.4–15.2)

## 2022-06-19 MED ORDER — RIVAROXABAN 15 MG PO TABS
15.0000 mg | ORAL_TABLET | Freq: Every day | ORAL | Status: DC
  Administered 2022-06-19: 15 mg via ORAL
  Filled 2022-06-19: qty 1

## 2022-06-19 MED ORDER — POTASSIUM CHLORIDE 20 MEQ PO PACK
40.0000 meq | PACK | Freq: Once | ORAL | Status: AC
  Administered 2022-06-19: 40 meq via ORAL
  Filled 2022-06-19: qty 2

## 2022-06-19 MED ORDER — MAGNESIUM HYDROXIDE 400 MG/5ML PO SUSP: 15.0000 mL | Freq: Every day | ORAL | Status: DC | PRN

## 2022-06-19 MED ORDER — POTASSIUM CHLORIDE IN NACL 20-0.9 MEQ/L-% IV SOLN
INTRAVENOUS | Status: DC
  Filled 2022-06-19 (×2): qty 1000

## 2022-06-19 MED ORDER — ORAL CARE MOUTH RINSE: 15.0000 mL | OROMUCOSAL | Status: DC | PRN

## 2022-06-19 MED ORDER — HYDROCODONE-ACETAMINOPHEN 5-325 MG PO TABS: 1.0000 | ORAL_TABLET | Freq: Four times a day (QID) | ORAL | Status: DC | PRN

## 2022-06-19 NOTE — Progress Notes (Signed)
[facility] PROGRESS NOTE    Progress Note  Manuel Lewis  [id number] DOB: 1922-03-04 DOA: 06/18/2022 PCP: [name], MD     Brief Narrative:   Manuel Lewis is an 89 y.o. male past medical history significant for atrial fibrillation on Coumadin with a history of pacemaker placement, essential hypertension history of prostate cancer with seed implantation brought in secondary to generalized weakness and hypotension and also near syncope.  In the ED was found to be hypotensive   Assessment/Plan:   Generalized weakness and near syncope: Probably secondary to decreased oral intake in the setting of antihypertensive medications. Was started on IV fluids antihypertensive medications were held. Blood pressure is improved check orthostatics. PT OT eval. Cardiac telemetry  Normocytic anemia: With guaiac positive test, he denies any melanotic stools. Patient is greater than 76 years old with no frank blood seen, his hemoglobin this morning is 9.4 to mild drop in hemoglobin is a hemodilutional effect. He is probably not a candidate for intervention. Denies any abdominal pain discontinue Protonix  Chronic atrial fibrillation with bradycardia: Resume anticoagulation is currently rate controlled. Pacer was interrogated no events.  Hypokalemia: Replete IV recheck in the morning.  Essential hypertension: Blood pressure significantly improved today, continue to hold antihypertensive medication.  Hard of hearing: Noted.   DVT prophylaxis: coumadin Family Communication:none Status is: Inpatient Remains inpatient appropriate because: Will need skilled nursing facility    Code Status:     Code Status Orders  (From admission, onward)           Start     Ordered   06/18/22 1942  Full code  Continuous       Question:  By:  Answer:  Consent: discussion documented in EHR   06/18/22 1942           Code Status History     Date Active Date Inactive Code  Status Order ID Comments User Context   07/01/2019 0020 07/06/2019 1833 Full Code [id number]  Rise Patience, MD Inpatient   07/23/2018 1005 07/23/2018 1429 Full Code [id number]  Constance Haw, MD Inpatient   05/16/2016 1800 05/22/2016 1431 Full Code [id number]  Nita Sells, MD Inpatient   08/18/2011 1456 08/19/2011 1844 Full Code [id number]  Ruben Reason, RN Inpatient      Advance Directive Documentation    Flowsheet Row Most Recent Value  Type of Advance Directive Healthcare Power of Attorney, Living will  Pre-existing out of facility DNR order (yellow form or pink MOST form) --  "MOST" Form in Place? --         IV Access:   Peripheral IV   Procedures and diagnostic studies:   CUP PACEART REMOTE DEVICE CHECK  Result Date: 06/19/2022 Scheduled remote reviewed. Normal device function.  Next remote 91 days. Kathy Breach, RN, CCDS, [employer]  DG Chest Port 1 View  Result Date: 06/18/2022 CLINICAL DATA:  Presyncope.  Hypotension. EXAM: PORTABLE CHEST 1 VIEW COMPARISON:  Radiographs 10/23/2021 and 06/30/2019. FINDINGS: 1514 hours. Right subclavian pacemaker lead is unchanged at the level of the anterior wall of the right ventricle. The heart is enlarged but stable. The lungs appear clear. There is no pleural effusion or pneumothorax. No acute osseous findings are evident. IMPRESSION: Cardiomegaly without evidence of acute cardiopulmonary process. Pacemaker lead unchanged in position. Electronically Signed   By: Richardean Sale M.D.   On: 06/18/2022 16:03     Medical Consultants:   None.   Subjective:  Manuel Lewis denies any abdominal pain  Objective:    Vitals:   06/18/22 2101 06/19/22 0024 06/19/22 0100 06/19/22 0447  BP:  (!) 142/72  (!) 143/84  Pulse:  61  78  Resp:  (!) 23 20 20   Temp:  97.6 F (36.4 C)  98 F (36.7 C)  TempSrc:  Oral  Oral  SpO2:  96%  98%  Weight: 72.1 kg     Height: 5\' 8"  (1.727 m)      SpO2: 98 %   Intake/Output  Summary (Last 24 hours) at 06/19/2022 0804 Last data filed at 06/19/2022 0254 Gross per 24 hour  Intake 136.72 ml  Output 1000 ml  Net -863.28 ml   Filed Weights   06/18/22 1610 06/18/22 2101  Weight: 74.8 kg 72.1 kg    Exam: General exam: In no acute distress. Respiratory system: Good air movement and clear to auscultation. Cardiovascular system: S1 & S2 heard, RRR. No JVD. Gastrointestinal system: Abdomen is nondistended, soft and nontender.  Extremities: No pedal edema. Skin: No rashes, lesions or ulcers Psychiatry: No just minor insight and medical condition.   Data Reviewed:    Labs: Basic Metabolic Panel: Recent Labs  Lab 06/18/22 1600 06/19/22 0128  NA 137 138  K 3.3* 3.4*  CL 105 108  CO2 24 22  GLUCOSE 116* 111*  BUN 31* 28*  CREATININE 1.42* 1.16  CALCIUM 9.9 9.4  MG 1.9  --    GFR Estimated Creatinine Clearance: 34.4 mL/min (by C-G formula based on SCr of 1.16 mg/dL). Liver Function Tests: Recent Labs  Lab 06/19/22 0128  AST 13*  ALT 11  ALKPHOS 40  BILITOT 1.0  PROT 6.1*  ALBUMIN 3.8   No results for input(s): "LIPASE", "AMYLASE" in the last 168 hours. No results for input(s): "AMMONIA" in the last 168 hours. Coagulation profile No results for input(s): "INR", "PROTIME" in the last 168 hours. COVID-19 Labs  No results for input(s): "DDIMER", "FERRITIN", "LDH", "CRP" in the last 72 hours.  Lab Results  Component Value Date   Leal NEGATIVE 06/30/2019    CBC: Recent Labs  Lab 06/18/22 1600 06/18/22 2119 06/19/22 0128  WBC 6.5 5.6 5.9  NEUTROABS 4.9  --   --   HGB 11.5* 10.1* 9.4*  HCT 35.6* 30.9* 29.6*  MCV 95.2 95.7 97.4  PLT 283 237 224   Cardiac Enzymes: No results for input(s): "CKTOTAL", "CKMB", "CKMBINDEX", "TROPONINI" in the last 168 hours. BNP (last 3 results) No results for input(s): "PROBNP" in the last 8760 hours. CBG: Recent Labs  Lab 06/18/22 1555  GLUCAP 112*   D-Dimer: No results for input(s):  "DDIMER" in the last 72 hours. Hgb A1c: No results for input(s): "HGBA1C" in the last 72 hours. Lipid Profile: No results for input(s): "CHOL", "HDL", "LDLCALC", "TRIG", "CHOLHDL", "LDLDIRECT" in the last 72 hours. Thyroid function studies: No results for input(s): "TSH", "T4TOTAL", "T3FREE", "THYROIDAB" in the last 72 hours.  Invalid input(s): "FREET3" Anemia work up: No results for input(s): "VITAMINB12", "FOLATE", "FERRITIN", "TIBC", "IRON", "RETICCTPCT" in the last 72 hours. Sepsis Labs: Recent Labs  Lab 06/18/22 1600 06/18/22 2119 06/19/22 0128  WBC 6.5 5.6 5.9   Microbiology No results found for this or any previous visit (from the past 240 hour(s)).   Medications:    pantoprazole (PROTONIX) IV  40 mg Intravenous Q24H   QUEtiapine  25 mg Oral QHS   Continuous Infusions:  lactated ringers 75 mL/hr at 06/18/22 2200  LOS: 1 day   Charlynne Cousins  [facility]  06/19/2022, 8:04 AM

## 2022-06-19 NOTE — TOC Initial Note (Signed)
Transition of Care [facility]) - Initial/Assessment Note    Patient Details  Name: Manuel Lewis MRN: [id number] Date of Birth: Oct 15, 1921  [facility]) CM/SW Contact:    Henrietta Dine, RN Phone Number: 06/19/2022, 10:49 AM  Clinical Narrative:                 Patient oriented x 1 to self; spoke w/ his son Dr Crissie Reese [phone]); he says pt  has private duty sitters and he stays w/ pt at night; son also says pt has glasses for reading; awaiting PT eval; TOC will follow.  Expected Discharge Plan: Home/Self Care Barriers to Discharge: Continued Medical Work up   Patient Goals and CMS Choice Patient states their goals for this hospitalization and ongoing recovery are:: home per pt's son Manuel Lewis          Expected Discharge Plan and Services   Discharge Planning Services: CM Consult   Living arrangements for the past 2 months: Single Family Home                                      Prior Living Arrangements/Services Living arrangements for the past 2 months: Single Family Home Lives with:: Self Patient language and need for interpreter reviewed:: Yes Do you feel safe going back to the place where you live?: Yes      Need for Family Participation in Patient Care: No (Comment) Care giver support system in place?: Yes (comment) Current home services: Other (comment) (pt  has private pay [facility] aides during day) Criminal Activity/Legal Involvement Pertinent to Current Situation/Hospitalization: No - Comment as needed  Activities of Daily Living Home Assistive Devices/Equipment: Shower chair without back, Other (Comment) (raised toilet seat) ADL Screening (condition at time of admission) Patient's cognitive ability adequate to safely complete daily activities?: Yes Is the patient deaf or have difficulty hearing?: Yes Does the patient have difficulty seeing, even when wearing glasses/contacts?: No Does the patient have difficulty concentrating,  remembering, or making decisions?: Yes Patient able to express need for assistance with ADLs?: Yes Does the patient have difficulty dressing or bathing?: Yes Independently performs ADLs?: No Communication: Independent Dressing (OT): Needs assistance Is this a change from baseline?: Pre-admission baseline Grooming: Needs assistance Is this a change from baseline?: Pre-admission baseline Feeding: Needs assistance Is this a change from baseline?: Pre-admission baseline Bathing: Needs assistance Is this a change from baseline?: Pre-admission baseline Toileting: Needs assistance Is this a change from baseline?: Pre-admission baseline In/Out Bed: Needs assistance Is this a change from baseline?: Pre-admission baseline Walks in Home: Needs assistance Is this a change from baseline?: Pre-admission baseline Does the patient have difficulty walking or climbing stairs?: Yes Weakness of Legs: None Weakness of Arms/Hands: None  Permission Sought/Granted Permission sought to share information with : Case Manager Permission granted to share information with : Yes, Verbal Permission Granted  Share Information with NAME: Lenor Coffin, RN, CM     Permission granted to share info w Relationship: Dr Crissie Reese (son) [phone]     Emotional Assessment Appearance:: Other (Comment Required (unable to assess) Attitude/Demeanor/Rapport: Unable to Assess (unable to assess) Affect (typically observed): Unable to Assess Orientation: :  (unable to assess) Alcohol / Substance Use: Not Applicable Psych Involvement: No (comment)  Admission diagnosis:  Hypotension [I95.9] Patient Active Problem List   Diagnosis Date Noted   Hypotension 06/18/2022   Hypokalemia 06/18/2022  AKI (acute kidney injury) 06/18/2022   Unilateral primary osteoarthritis, left knee 02/23/2020   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 08/09/2019   Degenerative  retinal drusen of both eyes 08/09/2019   Advanced nonexudative age-related macular degeneration of right eye with subfoveal involvement 08/09/2019   History of vitrectomy 08/09/2019   Vitreomacular adhesion of left eye 08/09/2019   Atrial fibrillation 07/01/2019   Pacemaker 07/01/2019   Essential hypertension 07/01/2019   Normocytic normochromic anemia 07/01/2019   Hip fracture 07/01/2019   Closed left hip fracture, initial encounter 06/30/2019   Common bile duct (CBD) obstruction    Abnormal cholangiogram    Gallstones    Elevated LFTs    Acute cholecystitis 05/16/2016   Edema 06/30/2012   Hypertension 08/06/2010   Atrial fibrillation 08/06/2010   Bradycardia 08/06/2010   Pacemaker-Single chamber-MDT 123456   Complication of anesthesia 03/17/1996   PCP:  [name], MD Pharmacy:   [id number] - [place], [place] [id number] [place] [id number] Phone: [phone] Fax: [phone]  [id number] - [place], [place]. [address] [place] [address] Phone: [phone] Fax: [phone]  [facility] [address] [place] [address] Phone: [phone] Fax: [phone]  [id number] - [place], [place] [address] [place] [address] Phone: [phone] Fax: [phone]     Social Determinants of Health (SDOH) Social History: [place]: No Food Insecurity (06/19/2022)  Housing: Low Risk  (06/19/2022)  Transportation Needs: No Transportation Needs (06/19/2022)  Utilities: Not At Risk (06/19/2022)  Tobacco Use: Low Risk  (06/19/2022)   SDOH Interventions: Food Insecurity Interventions: Inpatient TOC Housing Interventions: Inpatient TOC Transportation Interventions: Inpatient TOC Utilities Interventions: Inpatient  TOC   Readmission Risk Interventions     No data to display

## 2022-06-20 DIAGNOSIS — I959 Hypotension, unspecified: Secondary | ICD-10-CM | POA: Diagnosis not present

## 2022-06-20 DIAGNOSIS — R42 Dizziness and giddiness: Secondary | ICD-10-CM | POA: Diagnosis not present

## 2022-06-20 DIAGNOSIS — I4821 Permanent atrial fibrillation: Secondary | ICD-10-CM | POA: Diagnosis not present

## 2022-06-20 DIAGNOSIS — N179 Acute kidney failure, unspecified: Secondary | ICD-10-CM | POA: Diagnosis not present

## 2022-06-20 LAB — BASIC METABOLIC PANEL
Anion gap: 7 (ref 5–15)
BUN: 19 mg/dL (ref 8–23)
CO2: 20 mmol/L — ABNORMAL LOW (ref 22–32)
Calcium: 9.1 mg/dL (ref 8.9–10.3)
Chloride: 112 mmol/L — ABNORMAL HIGH (ref 98–111)
Creatinine, Ser: 1.07 mg/dL (ref 0.61–1.24)
GFR, Estimated: >60 mL/min (ref >60)
Glucose, Bld: 105 mg/dL — ABNORMAL HIGH (ref 70–99)
Potassium: 4.1 mmol/L (ref 3.5–5.1)
Sodium: 139 mmol/L (ref 135–145)

## 2022-06-20 MED ORDER — RIVAROXABAN 15 MG PO TABS: 15.0000 mg | ORAL_TABLET | Freq: Every day | ORAL | 0 refills | Status: DC

## 2022-06-20 NOTE — Progress Notes (Signed)
Reviewed dc instructions with pt's son including medications and follow up appointments. Answered all questions. Pt's son verbalized understanding. Burrel Legrand, Yancey Flemings, RN

## 2022-06-20 NOTE — Evaluation (Signed)
Physical Therapy Evaluation Patient Details Name: Manuel Lewis MRN: [id number] DOB: [birth year] Today's Date: 06/20/2022  History of Present Illness  89 y.o. male past medical history significant for atrial fibrillation on Coumadin with a history of pacemaker placement, essential hypertension history of prostate cancer with seed implantation brought in secondary to generalized weakness and hypotension and also near syncope.  In the ED was found to be hypotensive      Discharge Diagnoses:  Clinical Impression  Pt admitted with above diagnosis.  Pt currently with functional limitations due to the deficits listed below (see PT Problem List). Pt will benefit from acute skilled PT to increase their independence and safety with mobility to allow discharge.     The patient alert and  pleasant. Patient   required min guard assistance to ambulate with Rw.  Per report, patient has 24/7 caregivers. Patient will benefit from RW if he does not have one. Continue PT for ambulation while in acute care. Unsure of use of RW PTA.  BP sup 152/78, after ambulation 168/74, 95% SPO2 on RA. HR 81     Recommendations for follow up therapy are one component of a multi-disciplinary discharge planning process, led by the attending physician.  Recommendations may be updated based on patient status, additional functional criteria and insurance authorization.  Follow Up Recommendations       Assistance Recommended at Discharge Frequent or constant Supervision/Assistance  Patient can return home with the following  A little help with walking and/or transfers;A little help with bathing/dressing/bathroom;Help with stairs or ramp for entrance;Assistance with cooking/housework;Assist for transportation    Equipment Recommendations Other (comment) (RW if pt. does not have one)  Recommendations for Other Services       Functional Status Assessment Patient has had a recent decline in their functional status and demonstrates  the ability to make significant improvements in function in a reasonable and predictable amount of time.     Precautions / Restrictions Precautions Precautions: Fall      Mobility  Bed Mobility Overal bed mobility: Needs Assistance Bed Mobility: Supine to Sit     Supine to sit: Supervision     General bed mobility comments: no assistance  from therapist    Transfers Overall transfer level: Needs assistance Equipment used: Rolling walker (2 wheels) Transfers: Sit to/from Stand Sit to Stand: Min guard           General transfer comment: extra effort to stnad from low seat    Ambulation/Gait Ambulation/Gait assistance: Min guard Gait Distance (Feet): 80 Feet Assistive device: Rolling walker (2 wheels) Gait Pattern/deviations: Step-through pattern Gait velocity: decr     General Gait Details: gait steady on straight ans with turns  Careers information officer    Modified Rankin (Stroke Patients Only)       Balance Overall balance assessment: Mild deficits observed, not formally tested                                           Pertinent Vitals/Pain Pain Assessment Pain Assessment: No/denies pain    Home Living Family/patient expects to be discharged to:: Private residence Living Arrangements: Children;Non-relatives/Friends Available Help at Discharge: Family;Personal care attendant Type of Home: House Home Access: Stairs to enter Entrance Stairs-Rails: Right Entrance Stairs-Number of Steps: 4   Home Layout: Two level;Able to live  on main level with bedroom/bathroom Home Equipment: Rolling Walker (2 wheels) Additional Comments: pt states that he lives alone, apparantly has 24 /7 caregivers/son  at night per TOC.    Prior Function Prior Level of Function : Needs assist;Patient poor historian/Family not available             Mobility Comments: unsure, patient states he has a RW ADLs Comments: has cargivers      Hand Dominance   Dominant Hand: Right    Extremity/Trunk Assessment   Upper Extremity Assessment Upper Extremity Assessment: Overall WFL for tasks assessed    Lower Extremity Assessment Lower Extremity Assessment: Overall WFL for tasks assessed    Cervical / Trunk Assessment Cervical / Trunk Assessment: Kyphotic  Communication   Communication: HOH  Cognition Arousal/Alertness: Awake/alert Behavior During Therapy: WFL for tasks assessed/performed Overall Cognitive Status: No family/caregiver present to determine baseline cognitive functioning                                 General Comments: oriented to hospital, states he lives alone, follows directions, HOH        General Comments      Exercises     Assessment/Plan    PT Assessment Patient needs continued PT services  PT Problem List Decreased strength;Decreased mobility;Decreased activity tolerance;Decreased balance;Decreased knowledge of use of DME       PT Treatment Interventions DME instruction;Therapeutic activities;Gait training;Therapeutic exercise;Patient/family education;Functional mobility training;Balance training    PT Goals (Current goals can be found in the Care Plan section)  Acute Rehab PT Goals Patient Stated Goal: agrees to walk PT Goal Formulation: Patient unable to participate in goal setting Time For Goal Achievement: 07/04/22 Potential to Achieve Goals: Good    Frequency Min 3X/week     Co-evaluation               AM-PAC PT "6 Clicks" Mobility  Outcome Measure Help needed turning from your back to your side while in a flat bed without using bedrails?: None Help needed moving from lying on your back to sitting on the side of a flat bed without using bedrails?: None Help needed moving to and from a bed to a chair (including a wheelchair)?: A Little Help needed standing up from a chair using your arms (e.g., wheelchair or bedside chair)?: A Little Help needed to  walk in hospital room?: A Little Help needed climbing 3-5 steps with a railing? : A Little 6 Click Score: 20    End of Session Equipment Utilized During Treatment: Gait belt Activity Tolerance: Patient tolerated treatment well Patient left: in chair;with nursing/sitter in room Nurse Communication: Mobility status PT Visit Diagnosis: Unsteadiness on feet (R26.81);Difficulty in walking, not elsewhere classified (R26.2)    Time: [id number] PT Time Calculation (min) (ACUTE ONLY): 20 min   Charges:   PT Evaluation $PT Eval Low Complexity: 1 Low          Blanchard KelchKaren Corliss Coggeshall PT Acute Rehabilitation Services Office [phone] Weekend [phone]   Rada HayHill, Yasmin Bronaugh Elizabeth 06/20/2022, 8:59 AM

## 2022-06-20 NOTE — Plan of Care (Signed)

## 2022-06-20 NOTE — Discharge Summary (Addendum)
Physician Discharge Summary  Manuel Lewis HAL:[id number] DOB: [birth year] DOA: 06/18/2022  PCP: Kirby Funk, MD  Admit date: 06/18/2022 Discharge date: 06/20/2022  Admitted From: Home Disposition:  Home  Recommendations for Outpatient Follow-up:  Follow up with PCP in 1-2 weeks Please obtain BMP/CBC in one week Follow-up with [facility] in 2 weeks.  Home HealthYes Equipment/Devices:None  Discharge Condition:Stable CODE STATUS:Full Diet recommendation: Heart Healthy   Brief/Interim Summary: 89 y.o. male past medical history significant for atrial fibrillation on Coumadin with a history of pacemaker placement, essential hypertension history of prostate cancer with seed implantation brought in secondary to generalized weakness and hypotension and also near syncope.  In the ED was found to be hypotensive   Discharge Diagnoses:  Principal Problem:   Hypotension Active Problems:   Hypertension   Atrial fibrillation   Bradycardia   Pacemaker-Single chamber-MDT   Essential hypertension   Hypokalemia   AKI (acute kidney injury)  Generalized weakness and near syncope: Secondary to decreased oral intake in the setting of antihypertensive medication. Antihypertensive medications were held orthostatics were checked which were negative. He was fluid resuscitated, chest x-ray showed no infection etiology, UA.  Bland. His pacer was interrogated had no events he was monitored on telemetry with no events. His generalized weakness improved with IV fluids. Physical therapy evaluated the patient who go home with physical therapy. He will also go home off diuretic therapy.  Normocytic anemia: Guaiac positive he denies any melanotic stools. Hemoglobin remained relatively stable, he was continued on Xarelto his hemoglobin remained at 10.5. It was discussed with [facility] and they will follow-up with the patient as an outpatient as his PCP is within goal.  Chronic atrial  fibrillation with bradycardia: No change made to his medication pacer was interrogated with no events.  Hypokalemia: Was repleted IV now improved.  Essential hypertension: Blood pressure is improved he will go home on Norvasc and ARB. His hydrochlorothiazide was discontinued.   Discharge Instructions  Discharge Instructions     Diet - low sodium heart healthy   Complete by: As directed    Increase activity slowly   Complete by: As directed       Allergies as of 06/20/2022       Reactions   Alendronate Sodium Other (See Comments)   dizziness   Tamsulosin Other (See Comments)   Dizziness         Medication List     STOP taking these medications    hydrochlorothiazide 12.5 MG capsule Commonly known as: MICROZIDE   HYDROcodone-acetaminophen 5-325 MG tablet Commonly known as: NORCO/VICODIN   magnesium hydroxide 400 MG/5ML suspension Commonly known as: MILK OF MAGNESIA   warfarin 2 MG tablet Commonly known as: COUMADIN       TAKE these medications    albuterol 108 (90 Base) MCG/ACT inhaler Commonly known as: VENTOLIN HFA Inhale 1 puff into the lungs as needed for wheezing or shortness of breath.   amLODipine 5 MG tablet Commonly known as: NORVASC Take 5 mg by mouth daily.   CALCIUM-VITAMIN D PO Take 1 tablet by mouth daily.   donepezil 10 MG tablet Commonly known as: ARICEPT Take 10 mg by mouth at bedtime.   FERROUS SULFATE PO Take 1 tablet by mouth daily.   memantine 5 MG tablet Commonly known as: NAMENDA Take 5 mg by mouth 2 (two) times daily.   polyethylene glycol 17 g packet Commonly known as: MIRALAX / GLYCOLAX Take 17 g by mouth daily.   Rivaroxaban  15 MG Tabs tablet Commonly known as: XARELTO Take 1 tablet (15 mg total) by mouth daily with supper. What changed:  medication strength how much to take when to take this   telmisartan 80 MG tablet Commonly known as: MICARDIS Take 80 mg by mouth daily.   VITAMIN B-12 PO Take 1  capsule by mouth daily.        Allergies  Allergen Reactions   Alendronate Sodium Other (See Comments)    dizziness   Tamsulosin Other (See Comments)    Dizziness     Consultations: None   Procedures/Studies: CUP PACEART REMOTE DEVICE CHECK  Result Date: 06/19/2022 Scheduled remote reviewed. Normal device function.  Next remote 91 days. [name], RN, CCDS, [employer]  DG Chest Port 1 View  Result Date: 06/18/2022 CLINICAL DATA:  Presyncope.  Hypotension. EXAM: PORTABLE CHEST 1 VIEW COMPARISON:  Radiographs 10/23/2021 and 06/30/2019. FINDINGS: 1514 hours. Right subclavian pacemaker lead is unchanged at the level of the anterior wall of the right ventricle. The heart is enlarged but stable. The lungs appear clear. There is no pleural effusion or pneumothorax. No acute osseous findings are evident. IMPRESSION: Cardiomegaly without evidence of acute cardiopulmonary process. Pacemaker lead unchanged in position. Electronically Signed   By: Carey Bullocks M.D.   On: 06/18/2022 16:03   (Echo, Carotid, EGD, Colonoscopy, ERCP)    Subjective: No complaints  Discharge Exam: Vitals:   06/19/22 2112 06/20/22 0511  BP: (!) 155/78 (!) 146/70  Pulse: 64 (!) 59  Resp: 18 18  Temp: 98.3 F (36.8 C) 97.9 F (36.6 C)  SpO2: 97% 95%   Vitals:   06/19/22 1546 06/19/22 1548 06/19/22 2112 06/20/22 0511  BP: (!) 158/80 (!) 146/78 (!) 155/78 (!) 146/70  Pulse: 72 66 64 (!) 59  Resp: 20 19 18 18   Temp: 98.7 F (37.1 C) 98.5 F (36.9 C) 98.3 F (36.8 C) 97.9 F (36.6 C)  TempSrc: Oral Oral Oral Oral  SpO2: 98% 100% 97% 95%  Weight:      Height:        General: Pt is alert, awake, not in acute distress Cardiovascular: RRR, S1/S2 +, no rubs, no gallops Respiratory: CTA bilaterally, no wheezing, no rhonchi Abdominal: Soft, NT, ND, bowel sounds + Extremities: no edema, no cyanosis    The results of significant diagnostics from this hospitalization (including imaging,  microbiology, ancillary and laboratory) are listed below for reference.     Microbiology: No results found for this or any previous visit (from the past 240 hour(s)).   Labs: BNP (last 3 results) No results for input(s): "BNP" in the last 8760 hours. Basic Metabolic Panel: Recent Labs  Lab 06/18/22 1600 06/19/22 0128 06/20/22 0413  NA 137 138 139  K 3.3* 3.4* 4.1  CL 105 108 112*  CO2 24 22 20*  GLUCOSE 116* 111* 105*  BUN 31* 28* 19  CREATININE 1.42* 1.16 1.07  CALCIUM 9.9 9.4 9.1  MG 1.9  --   --    Liver Function Tests: Recent Labs  Lab 06/19/22 0128  AST 13*  ALT 11  ALKPHOS 40  BILITOT 1.0  PROT 6.1*  ALBUMIN 3.8   No results for input(s): "LIPASE", "AMYLASE" in the last 168 hours. No results for input(s): "AMMONIA" in the last 168 hours. CBC: Recent Labs  Lab 06/18/22 1600 06/18/22 2119 06/19/22 0128 06/19/22 0824 06/19/22 1310  WBC 6.5 5.6 5.9 6.4 6.3  NEUTROABS 4.9  --   --   --   --  HGB 11.5* 10.1* 9.4* 10.5* 10.5*  HCT 35.6* 30.9* 29.6* 32.0* 32.8*  MCV 95.2 95.7 97.4 95.8 98.8  PLT 283 237 224 240 250   Cardiac Enzymes: No results for input(s): "CKTOTAL", "CKMB", "CKMBINDEX", "TROPONINI" in the last 168 hours. BNP: Invalid input(s): "POCBNP" CBG: Recent Labs  Lab 06/18/22 1555  GLUCAP 112*   D-Dimer No results for input(s): "DDIMER" in the last 72 hours. Hgb A1c No results for input(s): "HGBA1C" in the last 72 hours. Lipid Profile No results for input(s): "CHOL", "HDL", "LDLCALC", "TRIG", "CHOLHDL", "LDLDIRECT" in the last 72 hours. Thyroid function studies No results for input(s): "TSH", "T4TOTAL", "T3FREE", "THYROIDAB" in the last 72 hours.  Invalid input(s): "FREET3" Anemia work up No results for input(s): "VITAMINB12", "FOLATE", "FERRITIN", "TIBC", "IRON", "RETICCTPCT" in the last 72 hours. Urinalysis    Component Value Date/Time   COLORURINE YELLOW 06/30/2019 1613   APPEARANCEUR CLEAR 06/30/2019 1613   LABSPEC 1.012  06/30/2019 1613   PHURINE 7.0 06/30/2019 1613   GLUCOSEU NEGATIVE 06/30/2019 1613   HGBUR NEGATIVE 06/30/2019 1613   BILIRUBINUR NEGATIVE 06/30/2019 1613   KETONESUR NEGATIVE 06/30/2019 1613   PROTEINUR 100 (A) 06/30/2019 1613   NITRITE NEGATIVE 06/30/2019 1613   LEUKOCYTESUR NEGATIVE 06/30/2019 1613   Sepsis Labs Recent Labs  Lab 06/18/22 2119 06/19/22 0128 06/19/22 0824 06/19/22 1310  WBC 5.6 5.9 6.4 6.3   Microbiology No results found for this or any previous visit (from the past 240 hour(s)).   SIGNED:   Marinda ElkAbraham Feliz Ortiz, MD  [facility] 06/20/2022, 10:19 AM Pager   If 7PM-7AM, please contact night-coverage [email or web address] Password [email or web address]

## 2022-07-03 ENCOUNTER — Other Ambulatory Visit: Payer: Self-pay | Admitting: Internal Medicine

## 2022-07-03 ENCOUNTER — Ambulatory Visit
Admission: RE | Admit: 2022-07-03 | Discharge: 2022-07-03 | Disposition: A | Discharge status: Home / Self Care | Payer: Medicare Other | Source: Ambulatory Visit | Attending: Internal Medicine | Admitting: Internal Medicine

## 2022-07-03 DIAGNOSIS — R051 Acute cough: Secondary | ICD-10-CM

## 2022-07-10 ENCOUNTER — Other Ambulatory Visit: Payer: Self-pay | Admitting: Gastroenterology

## 2022-07-10 DIAGNOSIS — D649 Anemia, unspecified: Secondary | ICD-10-CM

## 2022-07-10 DIAGNOSIS — K921 Melena: Secondary | ICD-10-CM

## 2022-07-10 DIAGNOSIS — K5900 Constipation, unspecified: Secondary | ICD-10-CM

## 2022-07-23 NOTE — Progress Notes (Signed)
Remote pacemaker transmission.   

## 2022-08-01 ENCOUNTER — Ambulatory Visit
Admission: RE | Admit: 2022-08-01 | Discharge: 2022-08-01 | Disposition: A | Discharge status: Home / Self Care | Payer: Medicare Other | Source: Ambulatory Visit | Attending: Gastroenterology | Admitting: Gastroenterology

## 2022-08-01 DIAGNOSIS — K921 Melena: Secondary | ICD-10-CM

## 2022-08-01 DIAGNOSIS — D649 Anemia, unspecified: Secondary | ICD-10-CM

## 2022-08-01 DIAGNOSIS — K5900 Constipation, unspecified: Secondary | ICD-10-CM

## 2022-08-01 MED ORDER — IOPAMIDOL (ISOVUE-300) INJECTION 61%
100.0000 mL | Freq: Once | INTRAVENOUS | Status: AC | PRN
  Administered 2022-08-01: 100 mL via INTRAVENOUS

## 2022-08-15 ENCOUNTER — Other Ambulatory Visit: Payer: Self-pay | Admitting: Physician Assistant

## 2022-08-15 ENCOUNTER — Ambulatory Visit
Admission: RE | Admit: 2022-08-15 | Discharge: 2022-08-15 | Disposition: A | Discharge status: Home / Self Care | Payer: Medicare Other | Source: Ambulatory Visit | Attending: Physician Assistant | Admitting: Physician Assistant

## 2022-08-15 DIAGNOSIS — R051 Acute cough: Secondary | ICD-10-CM

## 2023-01-01 ENCOUNTER — Encounter (HOSPITAL_BASED_OUTPATIENT_CLINIC_OR_DEPARTMENT_OTHER): Payer: Self-pay | Admitting: Student

## 2023-01-01 ENCOUNTER — Ambulatory Visit (HOSPITAL_BASED_OUTPATIENT_CLINIC_OR_DEPARTMENT_OTHER): Payer: Medicare Other | Admitting: Student

## 2023-01-01 DIAGNOSIS — M25062 Hemarthrosis, left knee: Secondary | ICD-10-CM | POA: Diagnosis not present

## 2023-01-01 DIAGNOSIS — M1712 Unilateral primary osteoarthritis, left knee: Secondary | ICD-10-CM

## 2023-01-01 MED ORDER — LIDOCAINE HCL 1 % IJ SOLN
4.0000 mL | INTRAMUSCULAR | Status: AC | PRN
Start: 2023-01-01 — End: 2023-01-01
  Administered 2023-01-01: 4 mL

## 2023-01-01 MED ORDER — TRIAMCINOLONE ACETONIDE 40 MG/ML IJ SUSP
2.0000 mL | INTRAMUSCULAR | Status: AC | PRN
Start: 2023-01-01 — End: 2023-01-01
  Administered 2023-01-01: 2 mL via INTRA_ARTICULAR

## 2023-01-01 NOTE — Progress Notes (Signed)
Chief Complaint: Left knee pain     History of Present Illness:    Manuel Lewis is a pleasant 89 y.o. male presenting for evaluation of left knee pain and swelling.  He is here today with his son.  Patient states that the symptoms have been slowly progressing over the last year.  This has been giving him significant difficulty over the past few weeks during which time he has had trouble walking due to the pain.  Patient normally is able to walk without any assistive device.  Denies any recent falls or injuries.  He did see Dr. Magnus Ivan in April 2023 and had the knee aspirated (75 ml) and injected with cortisone.  This did help him get good relief.   Surgical History:   No history of the left knee  PMH/PSH/Family History/Social History/Meds/Allergies:    Past Medical History:  Diagnosis Date   Atrial fibrillation (HCC)    Chronic a-fib (HCC)    since early 1990's with profound bradycardia and pauses; s/p  implantation  of Medtronic Adapta ADSR01, serial # [id number]   Complication of anesthesia 1998   bradycardia   Dysrhythmia    Hearing loss    Hypertension 1990   Hypertension    Inguinal hernia    Macular degeneration    Nocturia    Pacemaker    Prostate cancer (HCC) 1998   with seed implantation   Past Surgical History:  Procedure Laterality Date   APPENDECTOMY  1941   CARDIOVERSION     IN THE 1990's   CATARACT EXTRACTION     2000 and 2007/ h/o macular degeneration since 2009   CHOLECYSTECTOMY N/A 05/18/2016   Procedure: LAPAROSCOPIC CHOLECYSTECTOMY WITH INTRAOPERATIVE CHOLANGIOGRAM;  Surgeon: Ovidio Kin, MD;  Location: [facility];  Service: General;  Laterality: N/A;   ERCP N/A 05/21/2016   Procedure: ENDOSCOPIC RETROGRADE CHOLANGIOPANCREATOGRAPHY (ERCP);  Surgeon: Meryl Dare, MD;  Location: [facility];  Service: Endoscopy;  Laterality: N/A;   FINE NEEDLE ASPIRATION Left 07/01/2019   Procedure: Fine Needle Aspiration of left  knee;  Surgeon: Kathryne Hitch, MD;  Location: [facility] OR;  Service: Orthopedics;  Laterality: Left;   HERNIA REPAIR     1945 and 1985   HERNIA REPAIR  08/18/11   inguinal   INGUINAL HERNIA REPAIR  08/18/2011   Procedure: HERNIA REPAIR INGUINAL ADULT;  Surgeon: Adolph Pollack, MD;  Location: [facility] OR;  Service: General;  Laterality: Left;  Recurrent Left inguinal hernia repair   INGUINAL HERNIA REPAIR Right 04/02/2017   Procedure: OPEN RIGHT INGUINAL HERNIA REPAIR;  Surgeon: Ovidio Kin, MD;  Location: [facility];  Service: General;  Laterality: Right;   INSERT / REPLACE / REMOVE PACEMAKER  08/14/09   IMPLANTATION OF SINGLE CHAMBER PULSE  GENERATING SYSTEM;   MODEL ADSR01, SERIAL # [id number] ADAPTA   INSERTION OF MESH Right 04/02/2017   Procedure: INSERTION OF MESH;  Surgeon: Ovidio Kin, MD;  Location: [facility];  Service: General;  Laterality: Right;   pace maker     PPM GENERATOR CHANGEOUT N/A 07/23/2018   Procedure: PPM GENERATOR CHANGEOUT;  Surgeon: Regan Lemming, MD;  Location: [facility];  Service: Cardiovascular;  Laterality: N/A;   THYROIDECTOMY, PARTIAL  1960   benign nodule   TONSILLECTOMY  TOTAL HIP ARTHROPLASTY Left 07/01/2019   Procedure: ANTERIOR TOTAL HIP ARTHROPLASTY;  Surgeon: Kathryne Hitch, MD;  Location: [facility] OR;  Service: Orthopedics;  Laterality: Left;   US ECHOCARDIOGRAPHY  07/26/2009   EF 55-60%   Social History   Socioeconomic History   Marital status: Married    Spouse name: Not on file   Number of children: 1   Years of education: Not on file   Highest education level: Not on file  Occupational History   Occupation: RETIRED    Employer: [employer]    Comment: ENGINEER  Tobacco Use   Smoking status: Never   Smokeless tobacco: Never  Vaping Use   Vaping status: Never Used  Substance and Sexual Activity   Alcohol use: Not Currently    Comment: 1 GLASS OF WINE NIGHTLY   Drug use: Not Currently    Sexual activity: Never  Other Topics Concern   Not on file  Social History Narrative   ** Merged History Encounter **       MARRIED   SON IS DR. DAVID Salmi   RETIRED [employer] ENGINEER   NEVER SMOKED   1 GLASS OF WINE NIGHTLY   HE HAD ALSO BEEN A PILOT AND FLIGHT INSTRUCTOR   Social Determinants of Health   Financial Resource Strain: Not on file  Food Insecurity: Low Risk  (11/06/2022)   Received from [facility]   Hunger Vital Sign    Worried About Running Out of Food in the Last Year: Never true    Ran Out of Food in the Last Year: Never true  Transportation Needs: Not on file (11/06/2022)  Physical Activity: Not on file  Stress: Not on file  Social Connections: Not on file   Family History  Problem Relation Age of Onset   Stroke Mother    Kidney failure Father    Allergies  Allergen Reactions   Alendronate Sodium Other (See Comments)    dizziness   Tamsulosin Other (See Comments)    Dizziness    Current Outpatient Medications  Medication Sig Dispense Refill   albuterol (VENTOLIN HFA) 108 (90 Base) MCG/ACT inhaler Inhale 1 puff into the lungs as needed for wheezing or shortness of breath.     amLODipine (NORVASC) 5 MG tablet Take 5 mg by mouth daily.     CALCIUM-VITAMIN D PO Take 1 tablet by mouth daily.     Cyanocobalamin (VITAMIN B-12 PO) Take 1 capsule by mouth daily.     donepezil (ARICEPT) 10 MG tablet Take 10 mg by mouth at bedtime.     FERROUS SULFATE PO Take 1 tablet by mouth daily.     memantine (NAMENDA) 5 MG tablet Take 5 mg by mouth 2 (two) times daily.     polyethylene glycol (MIRALAX / GLYCOLAX) 17 g packet Take 17 g by mouth daily.     Rivaroxaban (XARELTO) 15 MG TABS tablet Take 1 tablet (15 mg total) by mouth daily with supper. 42 tablet 0   telmisartan (MICARDIS) 80 MG tablet Take 80 mg by mouth daily.     No current facility-administered medications for this visit.   No results found.  Review of Systems:   A ROS was performed including  pertinent positives and negatives as documented in the HPI.  Physical Exam :   Constitutional: NAD and appears stated age Neurological: Alert and oriented Psych: Appropriate affect and cooperative There were no vitals taken for this visit.   Comprehensive Musculoskeletal Exam:    Left  knee appears significantly swollen with moderate to large effusion present.  Some warmth to touch but no erythema.  Tenderness palpation over the patella and lateral joint line.  No instability with varus or valgus stress.  Imaging:   Xray review from 06/26/2021 (left knee 2 views): Tricompartmental osteoarthritis with medial and lateral joint space narrowing and diffuse osteophytes.   I personally reviewed and interpreted the radiographs.   Assessment:   89 y.o. male with tricompartmental left knee osteoarthritis.  This is severe however it does appear that he has been able to manage this well with aspirations, cortisone, and gel injections over the past few years.  He had been ambulating well with no assistance before this became more difficult over the past few weeks due to the knee pain.  There is a relatively large effusion present on exam today.  Given this I have offered aspiration and cortisone injection to which patient and son are agreeable to.  I was able to aspirate 135 mL of bloody serous fluid from the knee and injected cortisone without any complication.  Patient did tolerate this well and I have recommended continued follow-up with Dr. Magnus Ivan.  Plan :    -Left knee aspiration and cortisone injection performed today after consent obtained from patient and son -Continue following with Dr. Magnus Ivan as needed     Procedure Note  Patient: Manuel Lewis             Date of Birth: 10/14/1921           MRN: [id number]             Visit Date: 01/01/2023  Procedures: Visit Diagnoses:  1. Unilateral primary osteoarthritis, left knee     Large Joint Inj: L knee on 01/01/2023 6:08  PM Indications: pain and joint swelling Details: 22 G 1.5 in needle, superolateral approach Medications: 4 mL lidocaine 1 %; 2 mL triamcinolone acetonide 40 MG/ML Aspirate: 135 mL bloody and serous Outcome: tolerated well, no immediate complications Procedure, treatment alternatives, risks and benefits explained, specific risks discussed. Consent was given by the patient and power of attorney. Immediately prior to procedure a time out was called to verify the correct patient, procedure, equipment, support staff and site/side marked as required. Patient was prepped and draped in the usual sterile fashion.      I personally saw and evaluated the patient, and participated in the management and treatment plan.  Hazle Nordmann, PA-C Orthopedics

## 2023-04-09 ENCOUNTER — Inpatient Hospital Stay (HOSPITAL_COMMUNITY)
Admission: EM | Admit: 2023-04-09 | Discharge: 2023-04-11 | DRG: 301 | Disposition: A | Discharge status: Home / Self Care | Payer: Medicare Other | Attending: Vascular Surgery | Admitting: Vascular Surgery

## 2023-04-09 ENCOUNTER — Encounter (HOSPITAL_COMMUNITY): Payer: Self-pay

## 2023-04-09 ENCOUNTER — Other Ambulatory Visit: Payer: Self-pay

## 2023-04-09 ENCOUNTER — Emergency Department (HOSPITAL_COMMUNITY): Payer: Medicare Other

## 2023-04-09 DIAGNOSIS — Z9049 Acquired absence of other specified parts of digestive tract: Secondary | ICD-10-CM | POA: Diagnosis not present

## 2023-04-09 DIAGNOSIS — Z923 Personal history of irradiation: Secondary | ICD-10-CM

## 2023-04-09 DIAGNOSIS — Z950 Presence of cardiac pacemaker: Secondary | ICD-10-CM

## 2023-04-09 DIAGNOSIS — Z96642 Presence of left artificial hip joint: Secondary | ICD-10-CM | POA: Diagnosis present

## 2023-04-09 DIAGNOSIS — Z781 Physical restraint status: Secondary | ICD-10-CM | POA: Diagnosis not present

## 2023-04-09 DIAGNOSIS — I70221 Atherosclerosis of native arteries of extremities with rest pain, right leg: Secondary | ICD-10-CM | POA: Diagnosis present

## 2023-04-09 DIAGNOSIS — Z7901 Long term (current) use of anticoagulants: Secondary | ICD-10-CM

## 2023-04-09 DIAGNOSIS — I998 Other disorder of circulatory system: Secondary | ICD-10-CM | POA: Diagnosis not present

## 2023-04-09 DIAGNOSIS — Z79899 Other long term (current) drug therapy: Secondary | ICD-10-CM | POA: Diagnosis not present

## 2023-04-09 DIAGNOSIS — I482 Chronic atrial fibrillation, unspecified: Secondary | ICD-10-CM | POA: Diagnosis present

## 2023-04-09 DIAGNOSIS — I7090 Unspecified atherosclerosis: Principal | ICD-10-CM

## 2023-04-09 DIAGNOSIS — F03A0 Unspecified dementia, mild, without behavioral disturbance, psychotic disturbance, mood disturbance, and anxiety: Secondary | ICD-10-CM | POA: Diagnosis present

## 2023-04-09 DIAGNOSIS — H9190 Unspecified hearing loss, unspecified ear: Secondary | ICD-10-CM | POA: Diagnosis present

## 2023-04-09 DIAGNOSIS — Z888 Allergy status to other drugs, medicaments and biological substances status: Secondary | ICD-10-CM | POA: Diagnosis not present

## 2023-04-09 DIAGNOSIS — I82442 Acute embolism and thrombosis of left tibial vein: Secondary | ICD-10-CM | POA: Diagnosis not present

## 2023-04-09 DIAGNOSIS — Z8546 Personal history of malignant neoplasm of prostate: Secondary | ICD-10-CM

## 2023-04-09 DIAGNOSIS — I10 Essential (primary) hypertension: Secondary | ICD-10-CM | POA: Diagnosis present

## 2023-04-09 LAB — CBC WITH DIFFERENTIAL/PLATELET
Abs Immature Granulocytes: 0.04 10*3/uL (ref 0.00–0.07)
Basophils Absolute: 0.1 10*3/uL (ref 0.0–0.1)
Basophils Relative: 1 %
Eosinophils Absolute: 0.2 10*3/uL (ref 0.0–0.5)
Eosinophils Relative: 3 %
HCT: 36.5 % — ABNORMAL LOW (ref 39.0–52.0)
Hemoglobin: 11.8 g/dL — ABNORMAL LOW (ref 13.0–17.0)
Immature Granulocytes: 1 %
Lymphocytes Relative: 20 %
Lymphs Abs: 1.4 10*3/uL (ref 0.7–4.0)
MCH: 32.9 pg (ref 26.0–34.0)
MCHC: 32.3 g/dL (ref 30.0–36.0)
MCV: 101.7 fL — ABNORMAL HIGH (ref 80.0–100.0)
Monocytes Absolute: 0.8 10*3/uL (ref 0.1–1.0)
Monocytes Relative: 12 %
Neutro Abs: 4.3 10*3/uL (ref 1.7–7.7)
Neutrophils Relative %: 63 %
Platelets: 256 10*3/uL (ref 150–400)
RBC: 3.59 MIL/uL — ABNORMAL LOW (ref 4.22–5.81)
RDW: 13.8 % (ref 11.5–15.5)
WBC: 6.8 10*3/uL (ref 4.0–10.5)
nRBC: 0 % (ref 0.0–0.2)

## 2023-04-09 LAB — PROTIME-INR
INR: 1 (ref 0.8–1.2)
Prothrombin Time: 13.7 s (ref 11.4–15.2)

## 2023-04-09 LAB — COMPREHENSIVE METABOLIC PANEL
ALT: 10 U/L (ref 0–44)
AST: 25 U/L (ref 15–41)
Albumin: 4.3 g/dL (ref 3.5–5.0)
Alkaline Phosphatase: 54 U/L (ref 38–126)
Anion gap: 10 (ref 5–15)
BUN: 24 mg/dL — ABNORMAL HIGH (ref 8–23)
CO2: 22 mmol/L (ref 22–32)
Calcium: 9.5 mg/dL (ref 8.9–10.3)
Chloride: 106 mmol/L (ref 98–111)
Creatinine, Ser: 1.16 mg/dL (ref 0.61–1.24)
GFR, Estimated: 57 mL/min — ABNORMAL LOW (ref >60)
Glucose, Bld: 118 mg/dL — ABNORMAL HIGH (ref 70–99)
Potassium: 4.2 mmol/L (ref 3.5–5.1)
Sodium: 138 mmol/L (ref 135–145)
Total Bilirubin: 1 mg/dL (ref 0.0–1.2)
Total Protein: 7.8 g/dL (ref 6.5–8.1)

## 2023-04-09 LAB — I-STAT CHEM 8, ED
BUN: 32 mg/dL — ABNORMAL HIGH (ref 8–23)
Calcium, Ion: 1.19 mmol/L (ref 1.15–1.40)
Chloride: 109 mmol/L (ref 98–111)
Creatinine, Ser: 1.2 mg/dL (ref 0.61–1.24)
Glucose, Bld: 113 mg/dL — ABNORMAL HIGH (ref 70–99)
HCT: 36 % — ABNORMAL LOW (ref 39.0–52.0)
Hemoglobin: 12.2 g/dL — ABNORMAL LOW (ref 13.0–17.0)
Potassium: 5 mmol/L (ref 3.5–5.1)
Sodium: 141 mmol/L (ref 135–145)
TCO2: 24 mmol/L (ref 22–32)

## 2023-04-09 MED ORDER — ALBUTEROL SULFATE HFA 108 (90 BASE) MCG/ACT IN AERS: 1.0000 | INHALATION_SPRAY | RESPIRATORY_TRACT | Status: DC | PRN

## 2023-04-09 MED ORDER — ALUM & MAG HYDROXIDE-SIMETH 200-200-20 MG/5ML PO SUSP: 15.0000 mL | ORAL | Status: DC | PRN

## 2023-04-09 MED ORDER — DONEPEZIL HCL 5 MG PO TABS
10.0000 mg | ORAL_TABLET | Freq: Every day | ORAL | Status: DC
Start: 2023-04-10 — End: 2023-04-11
  Administered 2023-04-10: 10 mg via ORAL
  Filled 2023-04-09: qty 2

## 2023-04-09 MED ORDER — ONDANSETRON HCL 4 MG/2ML IJ SOLN: 4.0000 mg | Freq: Four times a day (QID) | INTRAMUSCULAR | Status: DC | PRN

## 2023-04-09 MED ORDER — HYDRALAZINE HCL 20 MG/ML IJ SOLN: 5.0000 mg | INTRAMUSCULAR | Status: DC | PRN

## 2023-04-09 MED ORDER — ALBUTEROL SULFATE (2.5 MG/3ML) 0.083% IN NEBU: 2.5000 mg | INHALATION_SOLUTION | RESPIRATORY_TRACT | Status: DC | PRN

## 2023-04-09 MED ORDER — AMLODIPINE BESYLATE 5 MG PO TABS
5.0000 mg | ORAL_TABLET | Freq: Every day | ORAL | Status: DC
  Administered 2023-04-10 – 2023-04-11 (×2): 5 mg via ORAL
  Filled 2023-04-09 (×2): qty 1

## 2023-04-09 MED ORDER — HEPARIN BOLUS VIA INFUSION
4300.0000 [IU] | Freq: Once | INTRAVENOUS | Status: AC
  Administered 2023-04-09: 4300 [IU] via INTRAVENOUS
  Filled 2023-04-09: qty 4300

## 2023-04-09 MED ORDER — IOHEXOL 350 MG/ML SOLN
100.0000 mL | Freq: Once | INTRAVENOUS | Status: AC | PRN
  Administered 2023-04-09: 100 mL via INTRAVENOUS

## 2023-04-09 MED ORDER — POTASSIUM CHLORIDE CRYS ER 20 MEQ PO TBCR: 20.0000 meq | EXTENDED_RELEASE_TABLET | Freq: Once | ORAL | Status: DC

## 2023-04-09 MED ORDER — MEMANTINE HCL 10 MG PO TABS
5.0000 mg | ORAL_TABLET | Freq: Two times a day (BID) | ORAL | Status: DC
Start: 2023-04-10 — End: 2023-04-11
  Administered 2023-04-10 – 2023-04-11 (×3): 5 mg via ORAL
  Filled 2023-04-09 (×3): qty 1

## 2023-04-09 MED ORDER — ACETAMINOPHEN 650 MG RE SUPP: 325.0000 mg | RECTAL | Status: DC | PRN

## 2023-04-09 MED ORDER — LABETALOL HCL 5 MG/ML IV SOLN: 10.0000 mg | INTRAVENOUS | Status: DC | PRN

## 2023-04-09 MED ORDER — HEPARIN (PORCINE) 25000 UT/250ML-% IV SOLN
1050.0000 [IU]/h | INTRAVENOUS | Status: DC
  Administered 2023-04-09 – 2023-04-10 (×2): 1200 [IU]/h via INTRAVENOUS
  Filled 2023-04-09 (×2): qty 250

## 2023-04-09 MED ORDER — SODIUM CHLORIDE 0.9 % IV SOLN: INTRAVENOUS | Status: AC

## 2023-04-09 MED ORDER — METOPROLOL TARTRATE 5 MG/5ML IV SOLN: 2.0000 mg | INTRAVENOUS | Status: DC | PRN

## 2023-04-09 MED ORDER — ACETAMINOPHEN 325 MG PO TABS
325.0000 mg | ORAL_TABLET | ORAL | Status: DC | PRN
Start: 2023-04-09 — End: 2023-04-11

## 2023-04-09 NOTE — H&P (Signed)
H&P    Reason for Consult: Ischemic right leg Referring Physician: [facility] ED MRN #:  [id number]  History of Present Illness: This is a 89 y.o. male with mild dementia, atrial fibrillation off anticoagulation, hypertension, hearing loss the presents as a transfer from [facility] for evaluation of right leg ischemia.  Presented this evening around 4:00 pm when he developed some cramping in his right calf.  There was also a ED report of some decreased sensation as well in the right foot.  CTA was obtained showing thrombus in the right TP trunk as well as limited runoff.  His son who is a Engineer, petroleum notes that he stopped his blood thinner about 7 months ago after his hemoglobin dropped and he became anemic and was fecal occult positive.  On my evaluation at [facility] he denies any pain in his foot, denies any numbness, and is motor intact.  All of his symptoms have resolved.  Heparin is being started here.   Past Medical History:  Diagnosis Date   Atrial fibrillation (HCC)    Chronic a-fib (HCC)    since early 1990's with profound bradycardia and pauses; s/p  implantation  of Medtronic Adapta ADSR01, serial # [id number]   Complication of anesthesia 1998   bradycardia   Dysrhythmia    Hearing loss    Hypertension 1990   Hypertension    Inguinal hernia    Macular degeneration    Nocturia    Pacemaker    Prostate cancer (HCC) 1998   with seed implantation    Past Surgical History:  Procedure Laterality Date   APPENDECTOMY  1941   CARDIOVERSION     IN THE 1990's   CATARACT EXTRACTION     2000 and 2007/ h/o macular degeneration since 2009   CHOLECYSTECTOMY N/A 05/18/2016   Procedure: LAPAROSCOPIC CHOLECYSTECTOMY WITH INTRAOPERATIVE CHOLANGIOGRAM;  Surgeon: Ovidio Kin, MD;  Location: [facility];  Service: General;  Laterality: N/A;   ERCP N/A 05/21/2016   Procedure: ENDOSCOPIC RETROGRADE CHOLANGIOPANCREATOGRAPHY (ERCP);  Surgeon: Meryl Dare, MD;  Location: [facility];   Service: Endoscopy;  Laterality: N/A;   FINE NEEDLE ASPIRATION Left 07/01/2019   Procedure: Fine Needle Aspiration of left knee;  Surgeon: Kathryne Hitch, MD;  Location: [facility] OR;  Service: Orthopedics;  Laterality: Left;   HERNIA REPAIR     1945 and 1985   HERNIA REPAIR  08/18/11   inguinal   INGUINAL HERNIA REPAIR  08/18/2011   Procedure: HERNIA REPAIR INGUINAL ADULT;  Surgeon: Adolph Pollack, MD;  Location[facility] OR;  Service: General;  Laterality: Left;  Recurrent Left inguinal hernia repair   INGUINAL HERNIA REPAIR Right 04/02/2017   Procedure: OPEN RIGHT INGUINAL HERNIA REPAIR;  Surgeon: Ovidio Kin, MD;  Location: [facility];  Service: General;  Laterality: Right;   INSERT / REPLACE / REMOVE PACEMAKER  08/14/09   IMPLANTATION OF SINGLE CHAMBER PULSE  GENERATING SYSTEM;   MODEL ADSR01, SERIAL # [id number] H ADAPTA   INSERTION OF MESH Right 04/02/2017   Procedure: INSERTION OF MESH;  Surgeon: Ovidio Kin, MD;  Location:  SURGERY CENTER;  Service: General;  Laterality: Right;   pace maker     PPM GENERATOR CHANGEOUT N/A 07/23/2018   Procedure: PPM GENERATOR CHANGEOUT;  Surgeon: Regan Lemming, MD;  Location: [facility];  Service: Cardiovascular;  Laterality: N/A;   THYROIDECTOMY, PARTIAL  1960   benign nodule   TONSILLECTOMY     TOTAL HIP ARTHROPLASTY Left 07/01/2019  Procedure: ANTERIOR TOTAL HIP ARTHROPLASTY;  Surgeon: Kathryne Hitch, MD;  Location: [facility] OR;  Service: Orthopedics;  Laterality: Left;   US ECHOCARDIOGRAPHY  07/26/2009   EF 55-60%    Allergies  Allergen Reactions   Alendronate Sodium Other (See Comments)    dizziness  Other Reaction(s): dizzy, felt bad, Other (See Comments)    dizziness   Tamsulosin Other (See Comments)    Dizziness  Other Reaction(s): Dizziness, Other (See Comments)    Dizziness    Prior to Admission medications   Medication Sig Start Date End Date Taking? Authorizing Provider  albuterol  (VENTOLIN HFA) 108 (90 Base) MCG/ACT inhaler Inhale 1 puff into the lungs as needed for wheezing or shortness of breath.    [provider]  amLODipine (NORVASC) 5 MG tablet Take 5 mg by mouth daily. 03/31/19   [provider]  CALCIUM-VITAMIN D PO Take 1 tablet by mouth daily.    [provider]  Cyanocobalamin (VITAMIN B-12 PO) Take 1 capsule by mouth daily.    [provider]  donepezil (ARICEPT) 10 MG tablet Take 10 mg by mouth at bedtime. 04/11/22   [provider]  FERROUS SULFATE PO Take 1 tablet by mouth daily.    [provider]  memantine (NAMENDA) 5 MG tablet Take 5 mg by mouth 2 (two) times daily. 07/31/20   [provider]  polyethylene glycol (MIRALAX / GLYCOLAX) 17 g packet Take 17 g by mouth daily.    [provider]  Rivaroxaban (XARELTO) 15 MG TABS tablet Take 1 tablet (15 mg total) by mouth daily with supper. 06/20/22   Marinda Elk, MD  telmisartan (MICARDIS) 80 MG tablet Take 80 mg by mouth daily.    [provider]    Social History   Socioeconomic History   Marital status: Married    Spouse name: Not on file   Number of children: 1   Years of education: Not on file   Highest education level: Not on file  Occupational History   Occupation: RETIRED    Employer: [employer]    Comment: ENGINEER  Tobacco Use   Smoking status: Never   Smokeless tobacco: Never  Vaping Use   Vaping status: Never Used  Substance and Sexual Activity   Alcohol use: Not Currently    Comment: 1 GLASS OF WINE NIGHTLY   Drug use: Not Currently   Sexual activity: Never  Other Topics Concern   Not on file  Social History Narrative   ** Merged History Encounter **       MARRIED   SON IS DR. DAVID [place]   RETIRED [employer] ENGINEER   NEVER SMOKED   1 GLASS OF WINE NIGHTLY   HE HAD ALSO BEEN A PILOT AND FLIGHT INSTRUCTOR   Social Drivers of Health   Financial Resource Strain: Not on file  Food  Insecurity: Low Risk  (01/06/2023)   Received from [facility]   Hunger Vital Sign    Worried About Running Out of Food in the Last Year: Never true    Ran Out of Food in the Last Year: Never true  Transportation Needs: No Transportation Needs (01/06/2023)   Received from [facility]    In the past 12 months, has lack of reliable transportation kept you from medical appointments, meetings, work or from getting things needed for daily living? : No  Physical Activity: Not on file  Stress: Not on file  Social Connections: Not  on file  Intimate Partner Violence: Not At Risk (06/19/2022)   Humiliation, Afraid, Rape, and Kick questionnaire    Fear of Current or Ex-Partner: No    Emotionally Abused: No    Physically Abused: No    Sexually Abused: No     Family History  Problem Relation Age of Onset   Stroke Mother    Kidney failure Father     ROS: [x]  Positive   [ ]  Negative   [ ]  All sytems reviewed and are negative  Cardiovascular: []  chest pain/pressure []  palpitations []  SOB lying flat []  DOE []  pain in legs while walking []  pain in legs at rest []  pain in legs at night []  non-healing ulcers []  hx of DVT []  swelling in legs  Pulmonary: []  productive cough []  asthma/wheezing []  home O2  Neurologic: []  weakness in []  arms []  legs []  numbness in []  arms []  legs []  hx of CVA []  mini stroke [] difficulty speaking or slurred speech []  temporary loss of vision in one eye []  dizziness  Hematologic: []  hx of cancer []  bleeding problems []  problems with blood clotting easily  Endocrine:   []  diabetes []  thyroid disease  GI []  vomiting blood []  blood in stool  GU: []  CKD/renal failure []  HD--[]  M/W/F or []  T/T/S []  burning with urination []  blood in urine  Psychiatric: []  anxiety []  depression  Musculoskeletal: []  arthritis []  joint pain  Integumentary: []  rashes []  ulcers  Constitutional: []  fever []  chills   Physical  Examination  Vitals:   04/09/23 2017 04/09/23 2116  BP: (!) 160/86 (!) 190/102  Pulse: 72 81  Resp: 18 16  Temp: 98.1 F (36.7 C) 97.7 F (36.5 C)  SpO2: 98% 100%   Body mass index is 24.14 kg/m.  General:  NAD Gait: Not observed HENT: WNL, normocephalic Pulmonary: normal non-labored breathing Cardiac: regular, without  Murmurs, rubs or gallops Abdomen:  soft, NT/ND Vascular Exam/Pulses: Bilateral femoral pulses palpable Bilateral popliteal pulses palpable Left DP palpable Right DP pretty brisk by Doppler, right foot motor and sensory intact Right foot is cooler than left No lower extremity wounds Musculoskeletal: no muscle wasting or atrophy  Neurologic: A&O X 3; Appropriate Affect ; SENSATION: normal; MOTOR FUNCTION:  moving all extremities equally. Speech is fluent/normal   CBC    Component Value Date/Time   WBC 6.8 04/09/2023 1849   RBC 3.59 (L) 04/09/2023 1849   HGB 11.8 (L) 04/09/2023 1849   HGB 12.5 (L) 07/20/2018 1338   HCT 36.5 (L) 04/09/2023 1849   HCT 36.8 (L) 07/20/2018 1338   PLT 256 04/09/2023 1849   PLT 275 07/20/2018 1338   MCV 101.7 (H) 04/09/2023 1849   MCV 96 07/20/2018 1338   MCH 32.9 04/09/2023 1849   MCHC 32.3 04/09/2023 1849   RDW 13.8 04/09/2023 1849   RDW 12.6 07/20/2018 1338   LYMPHSABS 1.4 04/09/2023 1849   MONOABS 0.8 04/09/2023 1849   EOSABS 0.2 04/09/2023 1849   BASOSABS 0.1 04/09/2023 1849    BMET    Component Value Date/Time   NA 138 04/09/2023 1849   NA 139 07/20/2018 1338   K 4.2 04/09/2023 1849   CL 106 04/09/2023 1849   CO2 22 04/09/2023 1849   GLUCOSE 118 (H) 04/09/2023 1849   BUN 24 (H) 04/09/2023 1849   BUN 23 07/20/2018 1338   CREATININE 1.16 04/09/2023 1849   CALCIUM 9.5 04/09/2023 1849   GFRNONAA 57 (L) 04/09/2023 1849   GFRAA >60 07/06/2019 0416  COAGS: Lab Results  Component Value Date   INR 1.0 04/09/2023   INR 1.5 (H) 06/19/2022   INR 2.3 (H) 07/06/2019     Non-Invasive Vascular Imaging:     CTA reviewed from [facility] that shows patent common femoral, profunda, SFA, popliteal artery on the right.  There is some thrombus in the TP trunk as well as what looks like some thrombus in the PT, peroneal, and anterior tibial artery.  ASSESSMENT/PLAN: This is a 89 y.o. male with mild dementia, atrial fibrillation off anticoagulation, hypertension, hearing loss that presents as a transfer from [facility] for evaluation of right leg ischemia.   My initial plan was likely right lower extremity thrombectomy from a popliteal approach after review of his CTA at [facility].  However, on evaluation in the ED at [facility] he has had complete resolution of symptoms.  He has no pain in the foot and no numbness (never had a motor deficit).  He has pretty brisk DP signal in the right foot.  I had a long discussion with the son at bedside and given his age of 46 with underlying dementia he is appropriately worried about him getting anesthesia and potential prolonged hospital recovery.  Will admit on heparin with IV fluids and see how he does with conservative care.  Can always pursue percutaneous intervention at a later time if necessary.  The foot is perfused on my evaluation again with a pretty brisk Doppler signal.  Cephus Shelling, MD [facility] Office: [phone]  Cephus Shelling

## 2023-04-09 NOTE — ED Notes (Signed)
R dorsalis pedis faint with doppler. Posterior tibial pulse not present with doppler. Dr Silverio Lay at bedside.

## 2023-04-09 NOTE — ED Notes (Signed)
Assumed pt care from [facility]

## 2023-04-09 NOTE — ED Notes (Signed)
Surgeon at bedside with patient and family. 

## 2023-04-09 NOTE — Progress Notes (Signed)
PHARMACY - ANTICOAGULATION CONSULT NOTE  Pharmacy Consult for heparin Indication:  arterial occlusion  Allergies  Allergen Reactions   Alendronate Sodium Other (See Comments)    dizziness  Other Reaction(s): dizzy, felt bad, Other (See Comments)    dizziness   Tamsulosin Other (See Comments)    Dizziness  Other Reaction(s): Dizziness, Other (See Comments)    Dizziness    Patient Measurements: Height: 5\' 8"  (172.7 cm) Weight: 72 kg (158 lb 11.7 oz) IBW/kg (Calculated) : 68.4 Heparin Dosing Weight: 72 kg  Vital Signs: Temp: 97.9 F (36.6 C) (01/23 1752) Temp Source: Oral (01/23 1752) BP: 176/78 (01/23 1752) Pulse Rate: 83 (01/23 1752)  Labs: Recent Labs    04/09/23 1846 04/09/23 1849  HGB 12.2* 11.8*  HCT 36.0* 36.5*  PLT  --  256  LABPROT  --  13.7  INR  --  1.0  CREATININE 1.20 1.16    Estimated Creatinine Clearance: 33.6 mL/min (by C-G formula based on SCr of 1.16 mg/dL).   Medical History: Past Medical History:  Diagnosis Date   Atrial fibrillation (HCC)    Chronic a-fib (HCC)    since early 1990's with profound bradycardia and pauses; s/p  implantation  of Medtronic Adapta ADSR01, serial # [id number]   Complication of anesthesia 1998   bradycardia   Dysrhythmia    Hearing loss    Hypertension 1990   Hypertension    Inguinal hernia    Macular degeneration    Nocturia    Pacemaker    Prostate cancer (HCC) 1998   with seed implantation     Assessment: 89 year old male presented with leg pain. His son is a Engineer, petroleum, could not feel a pulse on evaluation and brought him straight to the emergency department. CTA showing right tibioperoneal trunk occlusion. Pharmacy consulted to start heparin infusion.  Patient with history of atrial fibrillation no longer on anticoagulation. Patient reports he has not been on Xarelto for 6 months.  Vascular is consulted. Baseline CBC WNL.  Goal of Therapy:  Heparin level 0.3-0.7 units/ml Monitor  platelets by anticoagulation protocol: Yes   Plan:  -Heparin 4300 unit bolus -Heparin infusion at 1200 units/hr -Check 8 hour heparin level -Daily CBC while on heparin infusion  Pricilla Riffle, PharmD, BCPS Clinical Pharmacist 04/09/2023 8:19 PM

## 2023-04-09 NOTE — ED Provider Notes (Signed)
  Physical Exam  BP (!) 141/85 (BP Location: Right Arm)   Pulse 73   Temp 98.6 F (37 C) (Oral)   Resp 15   Ht 5\' 8"  (1.727 m)   Wt 72 kg   SpO2 98%   BMI 24.14 kg/m   Physical Exam  Procedures  Procedures  ED Course / MDM   Clinical Course as of 04/10/23 1032  Thu Apr 09, 2023  2112 Patient has arrived to the [facility] emergency department.  Vascular surgery paged.  Pain is currently under control.  Has dopplerable pulse in his foot. [RP]  2113 Dr. Chestine Spore aware.  Is coming to see the patient shortly. [RP]  2135 Dr. Chestine Spore at the bedside.  Heparin drip started. [RP]  2147 Dr Chestine Spore to admit.  Patient symptoms have resolved.  Apparently the family has some reservations and surgeries other they will continue medical management for now. [RP]    Clinical Course User Index [RP] Rondel Baton, MD   Medical Decision Making Amount and/or Complexity of Data Reviewed Labs: ordered. Radiology: ordered.  Risk Prescription drug management. Decision regarding hospitalization.       Rondel Baton, MD 04/10/23 1032

## 2023-04-09 NOTE — ED Triage Notes (Addendum)
C/o decreased sensation to right foot that started this evening.  Pain noted on palpitation.  Weak palatable pedal pulse noted in triage.

## 2023-04-09 NOTE — ED Provider Notes (Signed)
[place] EMERGENCY DEPARTMENT AT [facility] Provider Note   CSN: [id number] Arrival date & time: 04/09/23  1741     History  Chief Complaint  Patient presents with   Leg Pain    BERLIE GAYLE is a 89 y.o. male history of hypertension, A-fib not anticoagulated, dementia here presenting with acute onset of right leg pain.  He states that about an hour prior to arrival, he felt his right leg was cold.  His son is a Engineer, petroleum and could not feel a pulse and brought him immediately over.  Patient used to be on Coumadin but had quite positive stool and anemia so was taken off of it.  I was called to the bedside because the triage nurse was unable to get a pulse on the right leg  The history is provided by the patient.       Home Medications Prior to Admission medications   Medication Sig Start Date End Date Taking? Authorizing Provider  albuterol (VENTOLIN HFA) 108 (90 Base) MCG/ACT inhaler Inhale 1 puff into the lungs as needed for wheezing or shortness of breath.    [provider]  amLODipine (NORVASC) 5 MG tablet Take 5 mg by mouth daily. 03/31/19   [provider]  CALCIUM-VITAMIN D PO Take 1 tablet by mouth daily.    [provider]  Cyanocobalamin (VITAMIN B-12 PO) Take 1 capsule by mouth daily.    [provider]  donepezil (ARICEPT) 10 MG tablet Take 10 mg by mouth at bedtime. 04/11/22   [provider]  FERROUS SULFATE PO Take 1 tablet by mouth daily.    [provider]  memantine (NAMENDA) 5 MG tablet Take 5 mg by mouth 2 (two) times daily. 07/31/20   [provider]  polyethylene glycol (MIRALAX / GLYCOLAX) 17 g packet Take 17 g by mouth daily.    [provider]  Rivaroxaban (XARELTO) 15 MG TABS tablet Take 1 tablet (15 mg total) by mouth daily with supper. 06/20/22   Marinda Elk, MD  telmisartan (MICARDIS) 80 MG tablet Take 80 mg by mouth daily.    [provider]       Allergies    Alendronate sodium and Tamsulosin    Review of Systems   Review of Systems  Musculoskeletal:        Right leg pain  All other systems reviewed and are negative.   Physical Exam Updated Vital Signs BP (!) 176/78 (BP Location: Right Arm)   Pulse 83   Temp 97.9 F (36.6 C) (Oral)   Resp 14   Ht 5\' 8"  (1.727 m)   Wt 72 kg   SpO2 100%   BMI 24.14 kg/m  Physical Exam Vitals and nursing note reviewed.  HENT:     Head: Normocephalic.     Nose: Nose normal.     Mouth/Throat:     Mouth: Mucous membranes are moist.  Eyes:     Extraocular Movements: Extraocular movements intact.     Pupils: Pupils are equal, round, and reactive to light.  Cardiovascular:     Rate and Rhythm: Normal rate and regular rhythm.     Pulses: Normal pulses.     Heart sounds: Normal heart sounds.  Pulmonary:     Effort: Pulmonary effort is normal.     Breath sounds: Normal breath sounds.  Abdominal:     General: Abdomen is flat.     Palpations: Abdomen is soft.  Musculoskeletal:  Cervical back: Normal range of motion.     Comments: Unable to palpate the right DP pulse.  I was able to Doppler a very faint right DP pulse.  Questionable sustained dopplerable right PT pulse.  Patient has a good right popliteal pulse  Skin:    General: Skin is warm.     Capillary Refill: Capillary refill takes less than 2 seconds.  Neurological:     General: No focal deficit present.     Mental Status: He is alert and oriented to person, place, and time.  Psychiatric:        Mood and Affect: Mood normal.        Behavior: Behavior normal.     ED Results / Procedures / Treatments   Labs (all labs ordered are listed, but only abnormal results are displayed) Labs Reviewed  CBC WITH DIFFERENTIAL/PLATELET - Abnormal; Notable for the following components:      Result Value   RBC 3.59 (*)    Hemoglobin 11.8 (*)    HCT 36.5 (*)    MCV 101.7 (*)    All other components within normal limits   COMPREHENSIVE METABOLIC PANEL - Abnormal; Notable for the following components:   Glucose, Bld 118 (*)    BUN 24 (*)    GFR, Estimated 57 (*)    All other components within normal limits  I-STAT CHEM 8, ED - Abnormal; Notable for the following components:   BUN 32 (*)    Glucose, Bld 113 (*)    Hemoglobin 12.2 (*)    HCT 36.0 (*)    All other components within normal limits  PROTIME-INR    EKG None  Radiology CT Angio Aortobifemoral W and/or Wo Contrast Result Date: 04/09/2023 CLINICAL DATA:  Claudication or leg ischemia in the right leg. Decreased sensation in the right foot. Pain to palpation. Weak pedal pulses. EXAM: CT ANGIOGRAPHY OF ABDOMINAL AORTA WITH ILIOFEMORAL RUNOFF TECHNIQUE: Multidetector CT imaging of the abdomen, pelvis and lower extremities was performed using the standard protocol during bolus administration of intravenous contrast. Multiplanar CT image reconstructions and MIPs were obtained to evaluate the vascular anatomy. RADIATION DOSE REDUCTION: This exam was performed according to the departmental dose-optimization program which includes automated exposure control, adjustment of the mA and/or kV according to patient size and/or use of iterative reconstruction technique. CONTRAST:  OMNIPAQUE IOHEXOL 350 MG/ML SOLN COMPARISON:  CT abdomen and pelvis 08/01/2022 FINDINGS: VASCULAR Aorta: Aortic atherosclerotic calcification without hemodynamically significant stenosis. No aneurysm or dissection. Celiac: Calcified plaque without significant stenosis, aneurysm, or dissection. SMA: Calcified plaque without significant stenosis, aneurysm, or dissection. Renals: Calcified plaque at the origins of both renal arteries causes moderate right and severe left renal artery narrowing. No aneurysm or dissection. IMA: Patent. RIGHT Lower Extremity Inflow: Calcified atherosclerotic plaque in the iliac arteries. No hemodynamically significant stenosis. No aneurysm or dissection. Outflow:  Advanced atherosclerotic plaque without severe stenosis, aneurysm, or dissection. Runoff: The tibioperoneal trunk is occluded distal to the takeoff of the anterior tibial artery. Extensive circumferential calcifications limits assessment of the runoff arteries. Minimal opacification of the runoff arteries due to collateral reconstitution. LEFT Lower Extremity Inflow: Calcified atherosclerotic plaque in the iliac arteries. No hemodynamically significant stenosis. No aneurysm or dissection. Outflow: Advanced atherosclerotic plaque without severe stenosis, aneurysm, or dissection. Runoff: Slow flow and extensive circumferential calcifications limits assessment of the runoff arteries. The posterior tibial artery is not well opacified after the mid calf. Segments of non-opacification of the distal anterior tibial artery. The  peroneal artery is patent to the ankle. Veins: No obvious venous abnormality within the limitations of this arterial phase study. Review of the MIP images confirms the above findings. NON-VASCULAR Lower chest: Mild cardiomegaly.  No pericardial effusion. Hepatobiliary: Cholecystectomy. Pneumobilia in the left hepatic lobe. Hepatic steatosis. No biliary dilation. Pancreas: Unremarkable. Spleen: Unremarkable. Adrenals/Urinary Tract: Normal adrenal glands. No urinary calculi or hydronephrosis. Unremarkable bladder were not obscured by streak artifact. Stomach/Bowel: Normal caliber large and small bowel without bowel wall thickening. The appendix is not visualized. Stomach is within normal limits. Lymphatic: No lymphadenopathy. Reproductive: Metallic seeds in the prostate. Other: No free intraperitoneal fluid or air. Musculoskeletal: No acute fracture. Moderate left knee joint effusion. Left THA. IMPRESSION: 1. Occlusion of the right tibioperoneal trunk distal to the takeoff of the anterior tibial artery. Extensive circumferential calcifications limits assessment of the runoff arteries. Minimal  opacification of the right runoff arteries. 2.  No acute abnormality in the abdomen or pelvis. 3. Hepatic steatosis. 4. Cardiomegaly. 5. Moderate left knee joint effusion. Aortic Atherosclerosis (ICD10-I70.0). Electronically Signed   By: Minerva Fester M.D.   On: 04/09/2023 19:34    Procedures Procedures    CRITICAL CARE Performed by: Richardean Canal   Total critical care time: 45 minutes  Critical care time was exclusive of separately billable procedures and treating other patients.  Critical care was necessary to treat or prevent imminent or life-threatening deterioration.  Critical care was time spent personally by me on the following activities: development of treatment plan with patient and/or surrogate as well as nursing, discussions with consultants, evaluation of patient's response to treatment, examination of patient, obtaining history from patient or surrogate, ordering and performing treatments and interventions, ordering and review of laboratory studies, ordering and review of radiographic studies, pulse oximetry and re-evaluation of patient's condition.   Medications Ordered in ED Medications  iohexol (OMNIPAQUE) 350 MG/ML injection 100 mL (100 mLs Intravenous Contrast Given 04/09/23 1859)    ED Course/ Medical Decision Making/ A&P                                 Medical Decision Making JAVONTEZ BACANI is a 89 y.o. male here presenting with right lower leg ischemia.  I was unable to get palpable pulse but I was able to get a dopplerable right DP pulse.  I discussed with Dr. Chestine Spore on arrival.  He recommends stat CTA  8:00 PM CTA showed right tibioperoneal trunk occlusion.  I have ordered heparin.  However son wants to drive him to the [facility] ER.  I discussed case with Dr. Chestine Spore who will see him on arrival.  Dr. Jarold Motto will be accepting. He will be started on heparin when he arrives at [facility].    Problems Addressed: Arterial occlusion: acute illness or injury  Amount  and/or Complexity of Data Reviewed Labs: ordered. Decision-making details documented in ED Course. Radiology: ordered and independent interpretation performed. Decision-making details documented in ED Course.  Risk Prescription drug management.    Final Clinical Impression(s) / ED Diagnoses Final diagnoses:  None    Rx / DC Orders ED Discharge Orders     None         Charlynne Pander, MD 04/09/23 2001

## 2023-04-10 ENCOUNTER — Other Ambulatory Visit (HOSPITAL_COMMUNITY): Payer: Medicare Other

## 2023-04-10 DIAGNOSIS — I82442 Acute embolism and thrombosis of left tibial vein: Secondary | ICD-10-CM | POA: Diagnosis not present

## 2023-04-10 DIAGNOSIS — I998 Other disorder of circulatory system: Secondary | ICD-10-CM | POA: Diagnosis not present

## 2023-04-10 LAB — COMPREHENSIVE METABOLIC PANEL
ALT: 12 U/L (ref 0–44)
AST: 22 U/L (ref 15–41)
Albumin: 3.9 g/dL (ref 3.5–5.0)
Alkaline Phosphatase: 48 U/L (ref 38–126)
Anion gap: 10 (ref 5–15)
BUN: 19 mg/dL (ref 8–23)
CO2: 21 mmol/L — ABNORMAL LOW (ref 22–32)
Calcium: 9.6 mg/dL (ref 8.9–10.3)
Chloride: 108 mmol/L (ref 98–111)
Creatinine, Ser: 1.2 mg/dL (ref 0.61–1.24)
GFR, Estimated: 54 mL/min — ABNORMAL LOW (ref >60)
Glucose, Bld: 114 mg/dL — ABNORMAL HIGH (ref 70–99)
Potassium: 4.2 mmol/L (ref 3.5–5.1)
Sodium: 139 mmol/L (ref 135–145)
Total Bilirubin: 1.4 mg/dL — ABNORMAL HIGH (ref 0.0–1.2)
Total Protein: 6.3 g/dL — ABNORMAL LOW (ref 6.5–8.1)

## 2023-04-10 LAB — CBC
HCT: 32.4 % — ABNORMAL LOW (ref 39.0–52.0)
Hemoglobin: 10.7 g/dL — ABNORMAL LOW (ref 13.0–17.0)
MCH: 32.7 pg (ref 26.0–34.0)
MCHC: 33 g/dL (ref 30.0–36.0)
MCV: 99.1 fL (ref 80.0–100.0)
Platelets: 246 10*3/uL (ref 150–400)
RBC: 3.27 MIL/uL — ABNORMAL LOW (ref 4.22–5.81)
RDW: 13.9 % (ref 11.5–15.5)
WBC: 7.6 10*3/uL (ref 4.0–10.5)
nRBC: 0 % (ref 0.0–0.2)

## 2023-04-10 LAB — HEPARIN LEVEL (UNFRACTIONATED)
Heparin Unfractionated: 0.31 [IU]/mL (ref 0.30–0.70)
Heparin Unfractionated: 0.58 [IU]/mL (ref 0.30–0.70)

## 2023-04-10 MED ORDER — DIPHENHYDRAMINE HCL 25 MG PO CAPS
25.0000 mg | ORAL_CAPSULE | Freq: Every evening | ORAL | Status: DC | PRN
  Administered 2023-04-10: 25 mg via ORAL
  Filled 2023-04-10: qty 1

## 2023-04-10 NOTE — ED Notes (Signed)
Pt confused and pulling off monitor, trying to get out of bed, and pulled out IV line. Pt has been reoriented and placed back in bed.

## 2023-04-10 NOTE — ED Notes (Signed)
Pt. Soft wrist restraints removed d/t son being at bedside.

## 2023-04-10 NOTE — Progress Notes (Addendum)
Progress Note    04/10/2023 8:03 AM * No surgery found *  Subjective:  alert, oriented to self. Keeps trying to pull his mittens off and lines out    Vitals:   04/10/23 0600 04/10/23 0700  BP: (!) 160/80 (!) 157/91  Pulse:  90  Resp: 17 (!) 21  Temp:  98.6 F (37 C)  SpO2:  98%    Physical Exam: General:  somewhat restless in bed, alert Lungs:  nonlabored Extremities:  palpable femoral and popliteal pulses. Palpable DP pulses bilaterally. Intact motor and sensation of right foot  CBC    Component Value Date/Time   WBC 7.6 04/10/2023 0359   RBC 3.27 (L) 04/10/2023 0359   HGB 10.7 (L) 04/10/2023 0359   HGB 12.5 (L) 07/20/2018 1338   HCT 32.4 (L) 04/10/2023 0359   HCT 36.8 (L) 07/20/2018 1338   PLT 246 04/10/2023 0359   PLT 275 07/20/2018 1338   MCV 99.1 04/10/2023 0359   MCV 96 07/20/2018 1338   MCH 32.7 04/10/2023 0359   MCHC 33.0 04/10/2023 0359   RDW 13.9 04/10/2023 0359   RDW 12.6 07/20/2018 1338   LYMPHSABS 1.4 04/09/2023 1849   MONOABS 0.8 04/09/2023 1849   EOSABS 0.2 04/09/2023 1849   BASOSABS 0.1 04/09/2023 1849    BMET    Component Value Date/Time   NA 139 04/10/2023 0359   NA 139 07/20/2018 1338   K 4.2 04/10/2023 0359   CL 108 04/10/2023 0359   CO2 21 (L) 04/10/2023 0359   GLUCOSE 114 (H) 04/10/2023 0359   BUN 19 04/10/2023 0359   BUN 23 07/20/2018 1338   CREATININE 1.20 04/10/2023 0359   CALCIUM 9.6 04/10/2023 0359   GFRNONAA 54 (L) 04/10/2023 0359   GFRAA >60 07/06/2019 0416    INR    Component Value Date/Time   INR 1.0 04/09/2023 1849     Intake/Output Summary (Last 24 hours) at 04/10/2023 0803 Last data filed at 04/10/2023 0533 Gross per 24 hour  Intake 240 ml  Output 200 ml  Net 40 ml      Assessment/Plan:  89 y.o. male admitted for an ischemic right leg   -The patient was transferred from [facility] last night with concerns for an ischemic right leg. CTA demonstrated thrombus in the right TP trunk with limited  runoff to the foot. Upon arrival to [facility], the patient denied any pain. He had no sensory or motor deficits to the right foot. He had a brisk DP doppler signal -He has been admitted on heparin. On my examination this morning he continues to remain sensorimotor intact. He now has a palpable right DP pulse -Given that the patient's exam has improved on heparin, we do not plan on surgical intervention. We will continue heparin and possibly resume oral anticoagulation within the coming days -He has been disoriented and pulling out his IV lines overnight. He is still trying to take his soft mittens off. A sitter has been ordered but there is not one at the bedside. I have ordered soft wrist restraints to ensure he does not pull out more IV lines -We will order an echo to begin thrombus workup   Loel Dubonnet PA-C [facility] [phone] 04/10/2023 8:03 AM   I have seen and evaluated the patient. I agree with the PA note as documented above.  89 year old male seen with right leg ischemia last night in the ED with evidence of thrombus in the tibial vessels.  Symptoms completely  resolved on initial evaluation.  Today has a very brisk DP signal in the right foot.  No pain.  Sensory intact.  Will continue medical therapy on heparin.  Hopefully transition to Xarelto tomorrow.  Has hx of atrial fibrillation and off anticoagulation.  Can do percutaneous intervention in the future but again has no symptoms at this time.  Cephus Shelling, MD [facility] [place] [place] Office: [phone]

## 2023-04-10 NOTE — Plan of Care (Signed)
Problem: Safety: Goal: Non-violent Restraint(s) Outcome: Not Progressing

## 2023-04-10 NOTE — Progress Notes (Signed)
PHARMACY - ANTICOAGULATION CONSULT NOTE  Pharmacy Consult for heparin Indication: limb ischemia   Patient Measurements: Height: 5\' 8"  (172.7 cm) Weight: 72 kg (158 lb 11.7 oz) IBW/kg (Calculated) : 68.4 Heparin Dosing Weight: 72 kg  Labs: Recent Labs    04/09/23 1846 04/09/23 1849 04/10/23 0359 04/10/23 1320  HGB 12.2* 11.8* 10.7*  --   HCT 36.0* 36.5* 32.4*  --   PLT  --  256 246  --   LABPROT  --  13.7  --   --   INR  --  1.0  --   --   HEPARINUNFRC  --   --  0.31 0.58  CREATININE 1.20 1.16 1.20  --     Estimated Creatinine Clearance: 32.5 mL/min (by C-G formula based on SCr of 1.2 mg/dL).  Medical History: Past Medical History:  Diagnosis Date   Atrial fibrillation (HCC)    Chronic a-fib (HCC)    since early 1990's with profound bradycardia and pauses; s/p  implantation  of Medtronic Adapta ADSR01, serial # [id number]   Complication of anesthesia 1998   bradycardia   Dysrhythmia    Hearing loss    Hypertension 1990   Hypertension    Inguinal hernia    Macular degeneration    Nocturia    Pacemaker    Prostate cancer (HCC) 1998   with seed implantation   Assessment: 89 year old male presented with leg pain found to have limb ischemia.  CTA showing right tibioperoneal trunk occlusion.  Patient with history of atrial fibrillation no longer on anticoagulation.  Patient reports he has not been on Xarelto for 6 months.  Pharmacy consulted to start heparin infusion.  -Heparin level 0.58, therapeutic -No overt s/sx of bleeding noted.    Goal of Therapy:  Heparin level 0.3-0.7 units/ml Monitor platelets by anticoagulation protocol: Yes   Plan:  Continue Heparin infusion at 1200 units/hr Daily CBC, heparin level, and monitoring s/sx bleeding  Carron Brazen, PharmD Clinical Pharmacist 04/10/2023 2:32 PM

## 2023-04-10 NOTE — Progress Notes (Deleted)
PHARMACY - ANTICOAGULATION Pharmacy Consult for heparin Indication:  arterial occlusion Brief A/P: Heparin level within goal range Continue Heparin at current rate   Allergies  Allergen Reactions   Alendronate Sodium Other (See Comments)    dizziness  Other Reaction(s): dizzy, felt bad, Other (See Comments)    dizziness   Tamsulosin Other (See Comments)    Dizziness  Other Reaction(s): Dizziness, Other (See Comments)    Dizziness    Patient Measurements: Height: 5\' 8"  (172.7 cm) Weight: 72 kg (158 lb 11.7 oz) IBW/kg (Calculated) : 68.4 Heparin Dosing Weight: 72 kg  Vital Signs: Temp: 97.7 F (36.5 C) (01/23 2116) Temp Source: Oral (01/23 2116) BP: 166/97 (01/24 0330) Pulse Rate: 78 (01/24 0243)  Labs: Recent Labs    04/09/23 1846 04/09/23 1849 04/10/23 0359  HGB 12.2* 11.8* 10.7*  HCT 36.0* 36.5* 32.4*  PLT  --  256 246  LABPROT  --  13.7  --   INR  --  1.0  --   HEPARINUNFRC  --   --  0.31  CREATININE 1.20 1.16 1.20    Estimated Creatinine Clearance: 32.5 mL/min (by C-G formula based on SCr of 1.2 mg/dL).  Assessment: 89 year old male presented with leg pain, LLE thrombus on CT, for heparin.   Goal of Therapy:  Heparin level 0.3-0.7 units/ml Monitor platelets by anticoagulation protocol: Yes   Plan:  Continue Heparin at current rate  Check heparin level in 8 hours to verify remains in range  Geannie Risen, PharmD, BCPS

## 2023-04-10 NOTE — ED Notes (Signed)
Pt confused and accidentally pulled out IV at this time. Large bruise noted to left forearm where IV was at.

## 2023-04-10 NOTE — ED Notes (Signed)
Pt continuously pulling out IV lines and pulling off monitor, pt is confused and pulling everything off. Dr Chestine Spore made aware and order for sitter has been placed.

## 2023-04-10 NOTE — Progress Notes (Signed)
PHARMACY - ANTICOAGULATION CONSULT NOTE  Pharmacy Consult for heparin Indication: limb ischemia   Patient Measurements: Height: 5\' 8"  (172.7 cm) Weight: 72 kg (158 lb 11.7 oz) IBW/kg (Calculated) : 68.4 Heparin Dosing Weight: 72 kg  Labs: Recent Labs    04/09/23 1846 04/09/23 1849 04/10/23 0359  HGB 12.2* 11.8* 10.7*  HCT 36.0* 36.5* 32.4*  PLT  --  256 246  LABPROT  --  13.7  --   INR  --  1.0  --   HEPARINUNFRC  --   --  0.31  CREATININE 1.20 1.16  --     Estimated Creatinine Clearance: 33.6 mL/min (by C-G formula based on SCr of 1.16 mg/dL).  Medical History: Past Medical History:  Diagnosis Date   Atrial fibrillation (HCC)    Chronic a-fib (HCC)    since early 1990's with profound bradycardia and pauses; s/p  implantation  of Medtronic Adapta ADSR01, serial # [id number]   Complication of anesthesia 1998   bradycardia   Dysrhythmia    Hearing loss    Hypertension 1990   Hypertension    Inguinal hernia    Macular degeneration    Nocturia    Pacemaker    Prostate cancer (HCC) 1998   with seed implantation   Assessment: 89 year old male presented with leg pain found to have limb ischemia. CTA showing right tibioperoneal trunk occlusion. Patient with history of atrial fibrillation no longer on anticoagulation. Patient reports he has not been on Xarelto for 6 months. Pharmacy consulted to start heparin infusion.  Heparin level 0.31, therapeutic Heparin currently at 1200 units/hr. No overt s/sx of bleeding noted.    Goal of Therapy:  Heparin level 0.3-0.7 units/ml Monitor platelets by anticoagulation protocol: Yes   Plan:  Continue Heparin infusion at 1200 units/hr 8 hour heparin level Daily CBC, heparin level, and monitoring s/sx bleeding  Marja Kays, PharmD Clinical Pharmacist 04/10/2023 4:31 AM

## 2023-04-11 ENCOUNTER — Other Ambulatory Visit (HOSPITAL_COMMUNITY): Payer: Medicare Other

## 2023-04-11 ENCOUNTER — Other Ambulatory Visit: Payer: Self-pay | Admitting: Physician Assistant

## 2023-04-11 DIAGNOSIS — I82442 Acute embolism and thrombosis of left tibial vein: Secondary | ICD-10-CM | POA: Diagnosis not present

## 2023-04-11 DIAGNOSIS — I998 Other disorder of circulatory system: Secondary | ICD-10-CM | POA: Diagnosis not present

## 2023-04-11 LAB — CBC
HCT: 31.8 % — ABNORMAL LOW (ref 39.0–52.0)
Hemoglobin: 10.5 g/dL — ABNORMAL LOW (ref 13.0–17.0)
MCH: 32.5 pg (ref 26.0–34.0)
MCHC: 33 g/dL (ref 30.0–36.0)
MCV: 98.5 fL (ref 80.0–100.0)
Platelets: 244 10*3/uL (ref 150–400)
RBC: 3.23 MIL/uL — ABNORMAL LOW (ref 4.22–5.81)
RDW: 13.6 % (ref 11.5–15.5)
WBC: 7.1 10*3/uL (ref 4.0–10.5)
nRBC: 0 % (ref 0.0–0.2)

## 2023-04-11 LAB — HEPARIN LEVEL (UNFRACTIONATED): Heparin Unfractionated: 0.8 [IU]/mL — ABNORMAL HIGH (ref 0.30–0.70)

## 2023-04-11 MED ORDER — RIVAROXABAN 15 MG PO TABS: 15.0000 mg | ORAL_TABLET | Freq: Every day | ORAL | 2 refills | Status: DC

## 2023-04-11 MED ORDER — RIVAROXABAN 15 MG PO TABS
15.0000 mg | ORAL_TABLET | Freq: Every day | ORAL | Status: DC
  Administered 2023-04-11: 15 mg via ORAL
  Filled 2023-04-11: qty 1

## 2023-04-11 NOTE — Plan of Care (Signed)
Pt is alert and oriented to self and at times place. Pt continues to think he needs to go check on his wife. Pt oriented to situation and emotional support provided. Pt continues to pull at lines and is not comprehending the situation and need to not remove. Pt has had 2 bm this shift and voided multiple times. Heparin level decreased to 10.4ml/hr. Pulse is palpable in both right and left leg Problem: Safety: Goal: Non-violent Restraint(s) Outcome: Progressing   Problem: Education: Goal: Knowledge of General Education information will improve Description: Including pain rating scale, medication(s)/side effects and non-pharmacologic comfort measures Outcome: Progressing   Problem: Health Behavior/Discharge Planning: Goal: Ability to manage health-related needs will improve Outcome: Progressing   Problem: Clinical Measurements: Goal: Ability to maintain clinical measurements within normal limits will improve Outcome: Progressing Goal: Will remain free from infection Outcome: Progressing Goal: Diagnostic test results will improve Outcome: Progressing Goal: Respiratory complications will improve Outcome: Progressing Goal: Cardiovascular complication will be avoided Outcome: Progressing   Problem: Activity: Goal: Risk for activity intolerance will decrease Outcome: Progressing   Problem: Nutrition: Goal: Adequate nutrition will be maintained Outcome: Progressing   Problem: Coping: Goal: Level of anxiety will decrease Outcome: Progressing   Problem: Elimination: Goal: Will not experience complications related to bowel motility Outcome: Progressing Goal: Will not experience complications related to urinary retention Outcome: Progressing   Problem: Pain Managment: Goal: General experience of comfort will improve and/or be controlled Outcome: Progressing   Problem: Safety: Goal: Ability to remain free from injury will improve Outcome: Progressing   Problem: Skin  Integrity: Goal: Risk for impaired skin integrity will decrease Outcome: Progressing

## 2023-04-11 NOTE — Progress Notes (Signed)
Pt's son at bedside for discharge instructions.  States they have xyrelto at home from when pt was previously on it but unsure if it's still in date.  Vascular PA called and will send rx to pt's pharmacy in case they need a new refill.    Pt discharged back to [facility] with son Onalee Hua as transport.

## 2023-04-11 NOTE — Progress Notes (Signed)
PHARMACY - ANTICOAGULATION CONSULT NOTE  Pharmacy Consult for heparin Indication: limb ischemia   Patient Measurements: Height: 5\' 8"  (172.7 cm) Weight: 72 kg (158 lb 11.7 oz) IBW/kg (Calculated) : 68.4 Heparin Dosing Weight: 72 kg  Labs: Recent Labs    04/09/23 1846 04/09/23 1849 04/10/23 0359 04/10/23 1320 04/11/23 0313  HGB 12.2* 11.8* 10.7*  --  10.5*  HCT 36.0* 36.5* 32.4*  --  31.8*  PLT  --  256 246  --  244  LABPROT  --  13.7  --   --   --   INR  --  1.0  --   --   --   HEPARINUNFRC  --   --  0.31 0.58 0.80*  CREATININE 1.20 1.16 1.20  --   --     Estimated Creatinine Clearance: 32.5 mL/min (by C-G formula based on SCr of 1.2 mg/dL).  Medical History: Past Medical History:  Diagnosis Date   Atrial fibrillation (HCC)    Chronic a-fib (HCC)    since early 1990's with profound bradycardia and pauses; s/p  implantation  of Medtronic Adapta ADSR01, serial # [id number]   Complication of anesthesia 1998   bradycardia   Dysrhythmia    Hearing loss    Hypertension 1990   Hypertension    Inguinal hernia    Macular degeneration    Nocturia    Pacemaker    Prostate cancer (HCC) 1998   with seed implantation   Assessment: 89 year old male presented with leg pain found to have limb ischemia.  CTA showing right tibioperoneal trunk occlusion.  Patient with history of atrial fibrillation no longer on anticoagulation.  Patient reports he has not been on Xarelto for 6 months.  Pharmacy consulted to start heparin infusion.  1/25 AM update:  Heparin level supra-therapeutic  Goal of Therapy:  Heparin level 0.3-0.7 units/ml Monitor platelets by anticoagulation protocol: Yes   Plan:  Dec heparin to 1050 units/hr 8 hour heparin level  Abran Duke, PharmD, BCPS Clinical Pharmacist Phone: [phone]

## 2023-04-11 NOTE — Discharge Instructions (Signed)
Information on my medicine - XARELTO (Rivaroxaban)  Why was Xarelto prescribed for you? Xarelto was prescribed for you to reduce the risk of a blood clot forming that can cause a stroke if you have a medical condition called atrial fibrillation (a type of irregular heartbeat).  What do you need to know about xarelto ? Take your Xarelto ONCE DAILY at the same time every day with your evening meal. If you have difficulty swallowing the tablet whole, you may crush it and mix in applesauce just prior to taking your dose.  Take Xarelto exactly as prescribed by your doctor and DO NOT stop taking Xarelto without talking to the doctor who prescribed the medication.  Stopping without other stroke prevention medication to take the place of Xarelto may increase your risk of developing a clot that causes a stroke.  Refill your prescription before you run out.  After discharge, you should have regular check-up appointments with your healthcare provider that is prescribing your Xarelto.  In the future your dose may need to be changed if your kidney function or weight changes by a significant amount.  What do you do if you miss a dose? If you are taking Xarelto ONCE DAILY and you miss a dose, take it as soon as you remember on the same day then continue your regularly scheduled once daily regimen the next day. Do not take two doses of Xarelto at the same time or on the same day.   Important Safety Information A possible side effect of Xarelto is bleeding. You should call your healthcare provider right away if you experience any of the following: ? Bleeding from an injury or your nose that does not stop. ? Unusual colored urine (red or dark brown) or unusual colored stools (red or black). ? Unusual bruising for unknown reasons. ? A serious fall or if you hit your head (even if there is no bleeding).  Some medicines may interact with Xarelto and might increase your risk of bleeding while on  Xarelto. To help avoid this, consult your healthcare provider or pharmacist prior to using any new prescription or non-prescription medications, including herbals, vitamins, non-steroidal anti-inflammatory drugs (NSAIDs) and supplements.  This website has more information on Xarelto: VisitDestination.com.br.

## 2023-04-11 NOTE — Progress Notes (Addendum)
  Progress Note    04/11/2023 8:28 AM * No surgery found *  Subjective:  no pain in R LE since admission   Vitals:   04/11/23 0431 04/11/23 0811  BP: (!) 156/72 (!) 167/72  Pulse: 69 63  Resp: 18 20  Temp: (!) 97.2 F (36.2 C) 97.6 F (36.4 C)  SpO2: 98% 100%   Physical Exam: Lungs:  non labored Extremities:  palpable and symmetrical DP pulses Neurologic: A&O  CBC    Component Value Date/Time   WBC 7.1 04/11/2023 0313   RBC 3.23 (L) 04/11/2023 0313   HGB 10.5 (L) 04/11/2023 0313   HGB 12.5 (L) 07/20/2018 1338   HCT 31.8 (L) 04/11/2023 0313   HCT 36.8 (L) 07/20/2018 1338   PLT 244 04/11/2023 0313   PLT 275 07/20/2018 1338   MCV 98.5 04/11/2023 0313   MCV 96 07/20/2018 1338   MCH 32.5 04/11/2023 0313   MCHC 33.0 04/11/2023 0313   RDW 13.6 04/11/2023 0313   RDW 12.6 07/20/2018 1338   LYMPHSABS 1.4 04/09/2023 1849   MONOABS 0.8 04/09/2023 1849   EOSABS 0.2 04/09/2023 1849   BASOSABS 0.1 04/09/2023 1849    BMET    Component Value Date/Time   NA 139 04/10/2023 0359   NA 139 07/20/2018 1338   K 4.2 04/10/2023 0359   CL 108 04/10/2023 0359   CO2 21 (L) 04/10/2023 0359   GLUCOSE 114 (H) 04/10/2023 0359   BUN 19 04/10/2023 0359   BUN 23 07/20/2018 1338   CREATININE 1.20 04/10/2023 0359   CALCIUM 9.6 04/10/2023 0359   GFRNONAA 54 (L) 04/10/2023 0359   GFRAA >60 07/06/2019 0416    INR    Component Value Date/Time   INR 1.0 04/09/2023 1849     Intake/Output Summary (Last 24 hours) at 04/11/2023 1610 Last data filed at 04/11/2023 0600 Gross per 24 hour  Intake 424.93 ml  Output 350 ml  Net 74.93 ml     Assessment/Plan:  89 y.o. male admitted with concern for acute limb ischemia right leg  BLE well perfused with symmetrical DP pulses; no indication for intervention at this time H&H stable Transition back to [facility] for discharge home   Emilie Rutter, [place] [facility] [phone] 04/11/2023 8:28 AM   VASCULAR STAFF  ADDENDUM: I have independently interviewed and examined the patient. I agree with the above.  HGB stable, Transition to [employer]. OK for discharge. Will plan for outpatient follow up.    Daria Pastures MD [facility] Phone Number: [phone] 04/11/2023 9:09 AM

## 2023-04-11 NOTE — Progress Notes (Signed)
Caregiver at bedside.  Heparin discontinued as per orders, first dose of xyrelto given.  Pharmacy in giving education to caregiver.  Pt's son called and notified that we have received discharge orders. Additional discharge education to be provided when son arrives for transport to home.

## 2023-04-12 NOTE — Discharge Summary (Signed)
  Discharge Summary  Patient ID: Manuel Lewis [id number] 89 y.o. 08-27-1921  Admit date: 04/09/2023  Discharge date and time: 04/11/2023  1:15 PM   Admitting Physician: Cephus Shelling, MD   Discharge Physician: same  Admission Diagnoses: Arterial occlusion [I70.90] Critical limb ischemia of right lower extremity (HCC) [I70.221] Acute lower limb ischemia [I99.8]  Discharge Diagnoses: same  Admission Condition: fair  Discharged Condition: fair  Indication for Admission: acute thrombus right leg  Hospital Course: Mr. Manuel Lewis is a 89 year old male with mild dementia, atrial fibrillation without anticoagulation, and hypertension who was transferred from [facility] for evaluation of acute right leg ischemia.  Workup included CTA demonstrating thrombus in the right TP trunk with limited runoff.  He was started on IV heparin.  Discomfort in the right leg resolved during hospitalization.  He maintained a palpable DP pulse throughout his hospital stay.  Without symptom recurrence Dr. Chestine Spore elected to not offer revascularization given a well-perfused and asymptomatic right leg.  He was transition back to Xarelto at discharge.  He will follow-up in the office with a arterial duplex and ABI.  He was discharged home in stable condition.  Consults: None  Treatments: none  Discharge Exam: See progress note 04/11/23 Vitals:   04/11/23 0431 04/11/23 0811  BP: (!) 156/72 (!) 167/72  Pulse: 69 63  Resp: 18 20  Temp: (!) 97.2 F (36.2 C) 97.6 F (36.4 C)  SpO2: 98% 100%     Disposition: Discharge disposition: 01-Home or Self Care       Patient Instructions:  Allergies as of 04/11/2023       Reactions   Alendronate Sodium Other (See Comments)   dizziness Other Reaction(s): dizzy, felt bad, Other (See Comments)    dizziness   Tamsulosin Other (See Comments)   Dizziness Other Reaction(s): Dizziness, Other (See Comments)    Dizziness        Medication List      TAKE these medications    amLODipine 5 MG tablet Commonly known as: NORVASC Take 5 mg by mouth daily.   CALCIUM-VITAMIN D PO Take 1 tablet by mouth daily.   carboxymethylcellulose 0.5 % Soln Commonly known as: REFRESH PLUS Place 1 drop into both eyes daily as needed.   donepezil 10 MG tablet Commonly known as: ARICEPT Take 10 mg by mouth at bedtime.   memantine 5 MG tablet Commonly known as: NAMENDA Take 5 mg by mouth 2 (two) times daily.   Symbicort 80-4.5 MCG/ACT inhaler Generic drug: budesonide-formoterol SMARTSIG:1 Puff(s) Via Inhaler Every 4 Hours PRN   telmisartan 80 MG tablet Commonly known as: MICARDIS Take 80 mg by mouth daily.   VITAMIN B-12 PO Take 1 capsule by mouth daily.       ASK your doctor about these medications    Rivaroxaban 15 MG Tabs tablet Commonly known as: XARELTO Take 1 tablet (15 mg total) by mouth daily with supper. Ask about: Which instructions should I use?       Activity: activity as tolerated Diet: regular diet Wound Care: none needed  Follow-up with [facility] in 4 weeks.  Signed: Emilie Rutter, PA-C 04/12/2023 9:47 AM [facility] Office: [phone]

## 2023-05-27 ENCOUNTER — Other Ambulatory Visit: Payer: Self-pay

## 2023-05-27 DIAGNOSIS — I739 Peripheral vascular disease, unspecified: Secondary | ICD-10-CM

## 2023-06-02 ENCOUNTER — Encounter: Payer: Self-pay | Admitting: Vascular Surgery

## 2023-06-02 ENCOUNTER — Ambulatory Visit (INDEPENDENT_AMBULATORY_CARE_PROVIDER_SITE_OTHER)
Admission: RE | Admit: 2023-06-02 | Discharge: 2023-06-02 | Disposition: A | Discharge status: Home / Self Care | Payer: Medicare Other | Source: Ambulatory Visit | Attending: Vascular Surgery | Admitting: Vascular Surgery

## 2023-06-02 ENCOUNTER — Ambulatory Visit (HOSPITAL_COMMUNITY)
Admission: RE | Admit: 2023-06-02 | Discharge: 2023-06-02 | Disposition: A | Discharge status: Home / Self Care | Payer: Medicare Other | Source: Ambulatory Visit | Attending: Vascular Surgery | Admitting: Vascular Surgery

## 2023-06-02 ENCOUNTER — Ambulatory Visit: Payer: Medicare Other | Admitting: Vascular Surgery

## 2023-06-02 VITALS — BP 178/93 | HR 66 | Temp 98.4°F | Resp 18 | Ht 68.0 in | Wt 168.8 lb

## 2023-06-02 DIAGNOSIS — I70221 Atherosclerosis of native arteries of extremities with rest pain, right leg: Secondary | ICD-10-CM | POA: Diagnosis not present

## 2023-06-02 DIAGNOSIS — I739 Peripheral vascular disease, unspecified: Secondary | ICD-10-CM

## 2023-06-02 LAB — VAS US ABI WITH/WO TBI
Left ABI: 1.06
Right ABI: 0.94

## 2023-06-02 NOTE — Progress Notes (Signed)
 Patient name: MALEKE FERIA MRN: [id number] DOB: [birth year] Sex: male  REASON FOR CONSULT: Hospital follow-up  HPI: ARDIS FULLWOOD is a 89 y.o. male, with history of dementia and chronic atrial fibrillation that presents for hospital follow-up.  Previously seen with likely atheroembolic event to the right tibial vessels and was transferred from [facility] long ED for vascular surgery evaluation.  On arrival here at [facility] his symptoms had resolved with heparin (was not on anticoagulation prior to his evaluation due to previous anemia).  Ultimately we treated him with anticoagulation given risk for surgical intervention on a 89 year old with advanced dementia.  Today he has no lower extremity complaints.  He is here with his son who states has been tolerating his Xarelto.  Past Medical History:  Diagnosis Date   Atrial fibrillation (HCC)    Chronic a-fib (HCC)    since early 1990's with profound bradycardia and pauses; s/p  implantation  of Medtronic Adapta ADSR01, serial # [id number]   Complication of anesthesia 1998   bradycardia   Dysrhythmia    Hearing loss    Hypertension 1990   Hypertension    Inguinal hernia    Macular degeneration    Nocturia    Pacemaker    Peripheral vascular disease (HCC)    Prostate cancer (HCC) 1998   with seed implantation    Past Surgical History:  Procedure Laterality Date   APPENDECTOMY  1941   CARDIOVERSION     IN THE 1990's   CATARACT EXTRACTION     2000 and 2007/ h/o macular degeneration since 2009   CHOLECYSTECTOMY N/A 05/18/2016   Procedure: LAPAROSCOPIC CHOLECYSTECTOMY WITH INTRAOPERATIVE CHOLANGIOGRAM;  Surgeon: Ovidio Kin, MD;  Location: [facility]S;  Service: General;  Laterality: N/A;   ERCP N/A 05/21/2016   Procedure: ENDOSCOPIC RETROGRADE CHOLANGIOPANCREATOGRAPHY (ERCP);  Surgeon: Meryl Dare, MD;  Location: [facility];  Service: Endoscopy;  Laterality: N/A;   FINE NEEDLE ASPIRATION Left 07/01/2019   Procedure: Fine Needle Aspiration  of left knee;  Surgeon: Kathryne Hitch, MD;  Location: [facility] OR;  Service: Orthopedics;  Laterality: Left;   HERNIA REPAIR     1945 and 1985   HERNIA REPAIR  08/18/11   inguinal   INGUINAL HERNIA REPAIR  08/18/2011   Procedure: HERNIA REPAIR INGUINAL ADULT;  Surgeon: Adolph Pollack, MD;  Location: [facility] OR;  Service: General;  Laterality: Left;  Recurrent Left inguinal hernia repair   INGUINAL HERNIA REPAIR Right 04/02/2017   Procedure: OPEN RIGHT INGUINAL HERNIA REPAIR;  Surgeon: Ovidio Kin, MD;  Location: [facility];  Service: General;  Laterality: Right;   INSERT / REPLACE / REMOVE PACEMAKER  08/14/09   IMPLANTATION OF SINGLE CHAMBER PULSE  GENERATING SYSTEM;   MODEL ADSR01, SERIAL # [id number] ADAPTA   INSERTION OF MESH Right 04/02/2017   Procedure: INSERTION OF MESH;  Surgeon: Ovidio Kin, MD;  Location: [facility];  Service: General;  Laterality: Right;   pace maker     PPM GENERATOR CHANGEOUT N/A 07/23/2018   Procedure: PPM GENERATOR CHANGEOUT;  Surgeon: Regan Lemming, MD;  Location: [facility]VAS[facility];  Service: Cardiovascular;  Laterality: N/A;   THYROIDECTOMY, PARTIAL  1960   benign nodule   TONSILLECTOMY     TOTAL HIP ARTHROPLASTY Left 07/01/2019   Procedure: ANTERIOR TOTAL HIP ARTHROPLASTY;  Surgeon: Kathryne Hitch, MD;  Location: [facility] OR;  Service: Orthopedics;  Laterality: Left;   US ECHOCARDIOGRAPHY  07/26/2009   EF 55-60%  Family History  Problem Relation Age of Onset   Stroke Mother    Kidney failure Father     SOCIAL HISTORY: Social History   Socioeconomic History   Marital status: Married    Spouse name: Not on file   Number of children: 1   Years of education: Not on file   Highest education level: Not on file  Occupational History   Occupation: RETIRED    Employer: [employer]    Comment: ENGINEER  Tobacco Use   Smoking status: Never   Smokeless tobacco: Never  Vaping Use   Vaping status: Never Used   Substance and Sexual Activity   Alcohol use: Not Currently    Comment: 1 GLASS OF WINE NIGHTLY   Drug use: Not Currently   Sexual activity: Never  Other Topics Concern   Not on file  Social History Narrative   ** Merged History Encounter **       MARRIED   SON IS DR. DAVID Neisler   RETIRED [employer] ENGINEER   NEVER SMOKED   1 GLASS OF WINE NIGHTLY   HE HAD ALSO BEEN A PILOT AND FLIGHT INSTRUCTOR   Social Drivers of Health   Financial Resource Strain: Not on file  Food Insecurity: No Food Insecurity (04/10/2023)   Hunger Vital Sign    Worried About Running Out of Food in the Last Year: Never true    Ran Out of Food in the Last Year: Never true  Transportation Needs: No Transportation Needs (04/10/2023)   PRAPARE - Administrator, Civil Service (Medical): No    Lack of Transportation (Non-Medical): No  Physical Activity: Not on file  Stress: Not on file  Social Connections: Unknown (04/10/2023)   Social Connection and Isolation Panel [NHANES]    Frequency of Communication with Friends and Family: Patient unable to answer    Frequency of Social Gatherings with Friends and Family: Patient unable to answer    Attends Religious Services: Patient unable to answer    Active Member of Clubs or Organizations: Patient unable to answer    Attends Banker Meetings: Patient unable to answer    Marital Status: Widowed  Intimate Partner Violence: Not At Risk (04/10/2023)   Humiliation, Afraid, Rape, and Kick questionnaire    Fear of Current or Ex-Partner: No    Emotionally Abused: No    Physically Abused: No    Sexually Abused: No    Allergies  Allergen Reactions   Alendronate Sodium Other (See Comments)    dizziness  Other Reaction(s): dizzy, felt bad, Other (See Comments)    dizziness   Tamsulosin Other (See Comments)    Dizziness  Other Reaction(s): Dizziness, Other (See Comments)    Dizziness    Current Outpatient Medications  Medication Sig  Dispense Refill   amLODipine (NORVASC) 5 MG tablet Take 5 mg by mouth daily.     CALCIUM-VITAMIN D PO Take 1 tablet by mouth daily.     carboxymethylcellulose (REFRESH PLUS) 0.5 % SOLN Place 1 drop into both eyes daily as needed.     Cyanocobalamin (VITAMIN B-12 PO) Take 1 capsule by mouth daily.     donepezil (ARICEPT) 10 MG tablet Take 10 mg by mouth at bedtime.     memantine (NAMENDA) 5 MG tablet Take 5 mg by mouth 2 (two) times daily.     Rivaroxaban (XARELTO) 15 MG TABS tablet Take 1 tablet (15 mg total) by mouth daily with supper. 30 tablet 2  SYMBICORT 80-4.5 MCG/ACT inhaler SMARTSIG:1 Puff(s) Via Inhaler Every 4 Hours PRN     telmisartan (MICARDIS) 80 MG tablet Take 80 mg by mouth daily.     No current facility-administered medications for this visit.    REVIEW OF SYSTEMS:  [X]  denotes positive finding, [ ]  denotes negative finding Cardiac  Comments:  Chest pain or chest pressure:    Shortness of breath upon exertion:    Short of breath when lying flat:    Irregular heart rhythm:        Vascular    Pain in calf, thigh, or hip brought on by ambulation:    Pain in feet at night that wakes you up from your sleep:     Blood clot in your veins:    Leg swelling:         Pulmonary    Oxygen at home:    Productive cough:     Wheezing:         Neurologic    Sudden weakness in arms or legs:     Sudden numbness in arms or legs:     Sudden onset of difficulty speaking or slurred speech:    Temporary loss of vision in one eye:     Problems with dizziness:         Gastrointestinal    Blood in stool:     Vomited blood:         Genitourinary    Burning when urinating:     Blood in urine:        Psychiatric    Major depression:         Hematologic    Bleeding problems:    Problems with blood clotting too easily:        Skin    Rashes or ulcers:        Constitutional    Fever or chills:      PHYSICAL EXAM: Vitals:   06/02/23 1452  BP: (!) 178/93  Pulse: 66   Resp: 18  Temp: 98.4 F (36.9 C)  TempSrc: Temporal  SpO2: 97%  Weight: 168 lb 12.8 oz (76.6 kg)  Height: 5\' 8"  (1.727 m)    GENERAL: The patient is a well-nourished male, in no acute distress. The vital signs are documented above. CARDIAC: There is a regular rate and rhythm.  VASCULAR:  Bilateral DP pulses palpable PULMONARY: Respiratory distress. ABDOMEN: Soft and non-tender. MUSCULOSKELETAL: There are no major deformities or cyanosis. NEUROLOGIC: No focal weakness or paresthesias are detected. SKIN: There are no ulcers or rashes noted.   DATA:   ABI today 0.94 on the right multiphasic and 1.06 on the left multiphasic  Assessment/Plan:  89 y.o. male, with history of dementia and chronic atrial fibrillation that presents for hospital follow-up.  Previously seen with likely atheroembolic event to the right tibial vessels on 04/09/23 and was transferred from [facility] ED for vascular surgery evaluation.  On arrival here his symptoms had resolved with heparin.  Ultimately we treated him with anticoagulation given risk for surgical intervention on a 89 year old with advanced dementia.  He was not on anticoagulation prior to this event.  Today he has a palpable DP pulse in the right foot with normal ABIs.  He has no lower extremity complaints.  Very pleased with his progress.  Will continue to treat him in a conservative fashion with Xarelto for his chronic A-fib as we previously felt this was an atheroembolic event.  Will arrange follow-up in 1 year  with repeat ABIs.  Discussed with his son to let me know if he develops any lower extremity complaints and likely could pursue percutaneous intervention.   Cephus Shelling, MD [facility] [place] Office: [phone]

## 2023-06-17 ENCOUNTER — Ambulatory Visit: Admitting: Physician Assistant

## 2023-06-17 DIAGNOSIS — M1712 Unilateral primary osteoarthritis, left knee: Secondary | ICD-10-CM

## 2023-06-17 MED ORDER — LIDOCAINE HCL 1 % IJ SOLN
5.0000 mL | INTRAMUSCULAR | Status: AC | PRN
  Administered 2023-06-17: 5 mL

## 2023-06-17 MED ORDER — METHYLPREDNISOLONE ACETATE 40 MG/ML IJ SUSP
40.0000 mg | INTRAMUSCULAR | Status: AC | PRN
  Administered 2023-06-17: 40 mg via INTRA_ARTICULAR

## 2023-06-17 NOTE — Progress Notes (Signed)
   Procedure Note  Patient: Manuel Lewis             Date of Birth: 11/16/1921           MRN: [id number]             Visit Date: 06/17/2023 Mr. Leffel comes in today due to left knee pain and fluid on the knee.  He presents today with his son.  He has had no known injury to the knee.  Last seen 01/01/2023 knee was aspirated and injected we did well until recently.  Son states he has had no fevers or chills no ongoing infection.  Physical exam: Left knee: No abnormal warmth erythema.  Positive large effusion.  Full extension flexion beyond 90 degrees.  Procedures: Visit Diagnoses:  1. Unilateral primary osteoarthritis, left knee     Large Joint Inj: L knee on 06/17/2023 12:20 PM Indications: pain Details: 22 G 1.5 in needle, superolateral approach  Arthrogram: No  Medications: 5 mL lidocaine 1 %; 40 mg methylPREDNISolone acetate 40 MG/ML Aspirate: 65 mL yellow and blood-tinged Outcome: tolerated well, no immediate complications Procedure, treatment alternatives, risks and benefits explained, specific risks discussed. Consent was given by the patient. Immediately prior to procedure a time out was called to verify the correct patient, procedure, equipment, support staff and site/side marked as required. Patient was prepped and draped in the usual sterile fashion.     Plan: Knee was wrapped with an Ace bandage.  He will remove the bandage before retiring to bed this evening.  Follow-up with [place] as needed he and his son understand to wait least 3 months between cortisone injections.

## 2023-07-10 ENCOUNTER — Other Ambulatory Visit (HOSPITAL_BASED_OUTPATIENT_CLINIC_OR_DEPARTMENT_OTHER): Payer: Self-pay

## 2023-07-10 ENCOUNTER — Ambulatory Visit (HOSPITAL_BASED_OUTPATIENT_CLINIC_OR_DEPARTMENT_OTHER)

## 2023-07-10 ENCOUNTER — Ambulatory Visit (HOSPITAL_BASED_OUTPATIENT_CLINIC_OR_DEPARTMENT_OTHER): Admitting: Student

## 2023-07-10 ENCOUNTER — Encounter (HOSPITAL_BASED_OUTPATIENT_CLINIC_OR_DEPARTMENT_OTHER): Payer: Self-pay | Admitting: Student

## 2023-07-10 DIAGNOSIS — M25521 Pain in right elbow: Secondary | ICD-10-CM | POA: Diagnosis not present

## 2023-07-10 MED ORDER — METHYLPREDNISOLONE 4 MG PO TBPK
ORAL_TABLET | ORAL | 0 refills | Status: AC
  Filled 2023-07-10: qty 21, 6d supply, fill #0

## 2023-07-10 NOTE — Progress Notes (Signed)
 Chief Complaint: Right elbow pain     History of Present Illness:    Manuel Lewis is a 89 y.o. male presenting today with his daughter for evaluation of right elbow pain.  He has no known injury and the pain began last night.  States that there is some redness, swelling, and warmth over the elbow.  He has taken some Tylenol  which has helped give him some relief.  He does have history of a suspected gout flare in his left hand approximately 1 year ago which resolved completely after an oral steroid pack.  No history of injury or surgery to the right elbow.  He is right-hand dominant.   Surgical History:   None  PMH/PSH/Family History/Social History/Meds/Allergies:    Past Medical History:  Diagnosis Date   Atrial fibrillation (HCC)    Chronic a-fib (HCC)    since early 1990's with profound bradycardia and pauses; s/p  implantation  of Medtronic Adapta ADSR01, serial # [id number]   Complication of anesthesia 1998   bradycardia   Dysrhythmia    Hearing loss    Hypertension 1990   Hypertension    Inguinal hernia    Macular degeneration    Nocturia    Pacemaker    Peripheral vascular disease (HCC)    Prostate cancer (HCC) 1998   with seed implantation   Past Surgical History:  Procedure Laterality Date   APPENDECTOMY  1941   CARDIOVERSION     IN THE 1990's   CATARACT EXTRACTION     2000 and 2007/ h/o macular degeneration since 2009   CHOLECYSTECTOMY N/A 05/18/2016   Procedure: LAPAROSCOPIC CHOLECYSTECTOMY WITH INTRAOPERATIVE CHOLANGIOGRAM;  Surgeon: Juanita Norlander, MD;  Location: [facility];  Service: General;  Laterality: N/A;   ERCP N/A 05/21/2016   Procedure: ENDOSCOPIC RETROGRADE CHOLANGIOPANCREATOGRAPHY (ERCP);  Surgeon: Asencion Blacksmith, MD;  Location: [facility];  Service: Endoscopy;  Laterality: N/A;   FINE NEEDLE ASPIRATION Left 07/01/2019   Procedure: Fine Needle Aspiration of left knee;  Surgeon: Arnie Lao, MD;  Location:  [facility] OR;  Service: Orthopedics;  Laterality: Left;   HERNIA REPAIR     1945 and 1985   HERNIA REPAIR  08/18/11   inguinal   INGUINAL HERNIA REPAIR  08/18/2011   Procedure: HERNIA REPAIR INGUINAL ADULT;  Surgeon: Harlee Lichtenstein, MD;  Location: [facility] OR;  Service: General;  Laterality: Left;  Recurrent Left inguinal hernia repair   INGUINAL HERNIA REPAIR Right 04/02/2017   Procedure: OPEN RIGHT INGUINAL HERNIA REPAIR;  Surgeon: Juanita Norlander, MD;  Location: [facility];  Service: General;  Laterality: Right;   INSERT / REPLACE / REMOVE PACEMAKER  08/14/09   IMPLANTATION OF SINGLE CHAMBER PULSE  GENERATING SYSTEM;   MODEL ADSR01, SERIAL # [id number] ADAPTA   INSERTION OF MESH Right 04/02/2017   Procedure: INSERTION OF MESH;  Surgeon: Juanita Norlander, MD;  Location:  SURGERY CENTER;  Service: General;  Laterality: Right;   pace maker     PPM GENERATOR CHANGEOUT N/A 07/23/2018   Procedure: PPM GENERATOR CHANGEOUT;  Surgeon: Lei Pump, MD;  Location: [facility];  Service: Cardiovascular;  Laterality: N/A;   THYROIDECTOMY, PARTIAL  1960   benign nodule   TONSILLECTOMY     TOTAL HIP ARTHROPLASTY Left 07/01/2019   Procedure: ANTERIOR TOTAL  HIP ARTHROPLASTY;  Surgeon: Arnie Lao, MD;  Location: [facility] OR;  Service: Orthopedics;  Laterality: Left;   US  ECHOCARDIOGRAPHY  07/26/2009   EF 55-60%   Social History   Socioeconomic History   Marital status: Married    Spouse name: Not on file   Number of children: 1   Years of education: Not on file   Highest education level: Not on file  Occupational History   Occupation: RETIRED    Employer: [employer]    Comment: ENGINEER  Tobacco Use   Smoking status: Never   Smokeless tobacco: Never  Vaping Use   Vaping status: Never Used  Substance and Sexual Activity   Alcohol use: Not Currently    Comment: 1 GLASS OF WINE NIGHTLY   Drug use: Not Currently   Sexual activity: Never  Other Topics Concern   Not on  file  Social History Narrative   ** Merged History Encounter **       MARRIED   SON IS DR. DAVID Deblois   RETIRED [employer] ENGINEER   NEVER SMOKED   1 GLASS OF WINE NIGHTLY   HE HAD ALSO BEEN A PILOT AND FLIGHT INSTRUCTOR   Social Drivers of Health   Financial Resource Strain: Not on file  Food Insecurity: No Food Insecurity (04/10/2023)   Hunger Vital Sign    Worried About Running Out of Food in the Last Year: Never true    Ran Out of Food in the Last Year: Never true  Transportation Needs: No Transportation Needs (04/10/2023)   PRAPARE - Administrator, Civil Service (Medical): No    Lack of Transportation (Non-Medical): No  Physical Activity: Not on file  Stress: Not on file  Social Connections: Unknown (04/10/2023)   Social Connection and Isolation Panel [NHANES]    Frequency of Communication with Friends and Family: Patient unable to answer    Frequency of Social Gatherings with Friends and Family: Patient unable to answer    Attends Religious Services: Patient unable to answer    Active Member of Clubs or Organizations: Patient unable to answer    Attends Banker Meetings: Patient unable to answer    Marital Status: Widowed   Family History  Problem Relation Age of Onset   Stroke Mother    Kidney failure Father    Allergies  Allergen Reactions   Alendronate Sodium Other (See Comments)    dizziness  Other Reaction(s): dizzy, felt bad, Other (See Comments)    dizziness   Tamsulosin  Other (See Comments)    Dizziness  Other Reaction(s): Dizziness, Other (See Comments)    Dizziness   Current Outpatient Medications  Medication Sig Dispense Refill   methylPREDNISolone  (MEDROL  DOSEPAK) 4 MG TBPK tablet Take per packet instructions 21 each 0   amLODipine  (NORVASC ) 5 MG tablet Take 5 mg by mouth daily.     CALCIUM-VITAMIN D PO Take 1 tablet by mouth daily.     carboxymethylcellulose (REFRESH PLUS) 0.5 % SOLN Place 1 drop into both eyes  daily as needed.     Cyanocobalamin (VITAMIN B-12 PO) Take 1 capsule by mouth daily.     donepezil  (ARICEPT ) 10 MG tablet Take 10 mg by mouth at bedtime.     memantine  (NAMENDA ) 5 MG tablet Take 5 mg by mouth 2 (two) times daily.     Rivaroxaban  (XARELTO ) 15 MG TABS tablet Take 1 tablet (15 mg total) by mouth daily with supper. 30 tablet 2   SYMBICORT 80-4.5  MCG/ACT inhaler SMARTSIG:1 Puff(s) Via Inhaler Every 4 Hours PRN     telmisartan (MICARDIS) 80 MG tablet Take 80 mg by mouth daily.     No current facility-administered medications for this visit.   No results found.  Review of Systems:   A ROS was performed including pertinent positives and negatives as documented in the HPI.  Physical Exam :   Constitutional: NAD and appears stated age Neurological: Alert and oriented Psych: Appropriate affect and cooperative There were no vitals taken for this visit.   Comprehensive Musculoskeletal Exam:    Right elbow exam demonstrates diffuse mild swelling, erythema, and warmth throughout the elbow joint, particularly lateral and posterior.  Active elbow range of motion from 20 to 110 degrees.  No significant tenderness throughout the elbow.  Radial pulse 2+.  Distal neurosensory exam is intact.  Imaging:   Xray (right elbow 3 views): Mild degenerative changes noted throughout but no evidence of other bony abnormality   I personally reviewed and interpreted the radiographs.   Assessment:   89 y.o. male presenting with 1 day history of right elbow pain.  No specific injury or known cause.  On exam today, his elbow does appear somewhat swollen, erythematous, and warm.  No symptoms or evidence to suggest infectious process, but current symptoms appear most consistent with an acute gout flare.  He does have recent history last year of a suspected gout flare in the hand although unsure if this was ever confirmed.  Will plan to start him on a Medrol  Dosepak today and continue to monitor symptoms  in the coming days.  Return precautions given if symptoms do not improve or worsen.  Plan :    - Start Medrol  Dosepak and return to clinic as needed     I personally saw and evaluated the patient, and participated in the management and treatment plan.  Sharrell Deck, PA-C Orthopedics

## 2023-09-16 ENCOUNTER — Ambulatory Visit (INDEPENDENT_AMBULATORY_CARE_PROVIDER_SITE_OTHER): Payer: Medicare Other

## 2023-09-16 DIAGNOSIS — I4821 Permanent atrial fibrillation: Secondary | ICD-10-CM

## 2023-09-21 ENCOUNTER — Ambulatory Visit: Payer: Self-pay | Admitting: Cardiology

## 2023-09-21 LAB — CUP PACEART REMOTE DEVICE CHECK
Battery Remaining Longevity: 80 mo
Battery Voltage: 2.98 V
Brady Statistic RV Percent Paced: 99.64 %
Date Time Interrogation Session: 20250706171439
Implantable Lead Connection Status: 753985
Implantable Lead Implant Date: 20110531
Implantable Lead Location: 753860
Implantable Lead Model: 4471
Implantable Lead Serial Number: 484940
Implantable Pulse Generator Implant Date: 20200508
Lead Channel Impedance Value: 285 Ohm
Lead Channel Impedance Value: 361 Ohm
Lead Channel Pacing Threshold Amplitude: 1.125 V
Lead Channel Pacing Threshold Pulse Width: 0.4 ms
Lead Channel Sensing Intrinsic Amplitude: 5.25 mV
Lead Channel Sensing Intrinsic Amplitude: 5.25 mV
Lead Channel Setting Pacing Amplitude: 2.5 V
Lead Channel Setting Pacing Pulse Width: 0.4 ms
Lead Channel Setting Sensing Sensitivity: 1.2 mV
Zone Setting Status: 755011

## 2023-09-24 ENCOUNTER — Other Ambulatory Visit: Payer: Self-pay

## 2023-09-24 MED ORDER — RIVAROXABAN 15 MG PO TABS
15.0000 mg | ORAL_TABLET | Freq: Every day | ORAL | 2 refills | Status: AC
Start: 2023-09-24 — End: ?

## 2023-09-30 ENCOUNTER — Ambulatory Visit (HOSPITAL_COMMUNITY)
Admission: RE | Admit: 2023-09-30 | Discharge: 2023-09-30 | Disposition: A | Discharge status: Home / Self Care | Source: Ambulatory Visit | Attending: Emergency Medicine | Admitting: Emergency Medicine

## 2023-09-30 ENCOUNTER — Encounter (HOSPITAL_COMMUNITY): Payer: Self-pay

## 2023-09-30 ENCOUNTER — Emergency Department (HOSPITAL_COMMUNITY)
Admission: EM | Admit: 2023-09-30 | Discharge: 2023-09-30 | Disposition: A | Discharge status: Home / Self Care | Attending: Emergency Medicine | Admitting: Emergency Medicine

## 2023-09-30 ENCOUNTER — Other Ambulatory Visit: Payer: Self-pay

## 2023-09-30 DIAGNOSIS — R252 Cramp and spasm: Secondary | ICD-10-CM | POA: Diagnosis present

## 2023-09-30 DIAGNOSIS — R600 Localized edema: Secondary | ICD-10-CM | POA: Diagnosis present

## 2023-09-30 DIAGNOSIS — R609 Edema, unspecified: Secondary | ICD-10-CM | POA: Diagnosis not present

## 2023-09-30 LAB — CBC WITH DIFFERENTIAL/PLATELET
Abs Immature Granulocytes: 0.03 K/uL (ref 0.00–0.07)
Basophils Absolute: 0.1 K/uL (ref 0.0–0.1)
Basophils Relative: 1 %
Eosinophils Absolute: 0.1 K/uL (ref 0.0–0.5)
Eosinophils Relative: 2 %
HCT: 32.6 % — ABNORMAL LOW (ref 39.0–52.0)
Hemoglobin: 10.8 g/dL — ABNORMAL LOW (ref 13.0–17.0)
Immature Granulocytes: 1 %
Lymphocytes Relative: 17 %
Lymphs Abs: 1.1 K/uL (ref 0.7–4.0)
MCH: 32.3 pg (ref 26.0–34.0)
MCHC: 33.1 g/dL (ref 30.0–36.0)
MCV: 97.6 fL (ref 80.0–100.0)
Monocytes Absolute: 0.6 K/uL (ref 0.1–1.0)
Monocytes Relative: 10 %
Neutro Abs: 4.4 K/uL (ref 1.7–7.7)
Neutrophils Relative %: 69 %
Platelets: 228 K/uL (ref 150–400)
RBC: 3.34 MIL/uL — ABNORMAL LOW (ref 4.22–5.81)
RDW: 13.3 % (ref 11.5–15.5)
WBC: 6.3 K/uL (ref 4.0–10.5)
nRBC: 0 % (ref 0.0–0.2)

## 2023-09-30 LAB — URINALYSIS, ROUTINE W REFLEX MICROSCOPIC
Bacteria, UA: NONE SEEN
Bilirubin Urine: NEGATIVE
Glucose, UA: NEGATIVE mg/dL
Hgb urine dipstick: NEGATIVE
Ketones, ur: NEGATIVE mg/dL
Leukocytes,Ua: NEGATIVE
Nitrite: NEGATIVE
Protein, ur: 100 mg/dL — AB
Specific Gravity, Urine: 1.015 (ref 1.005–1.030)
pH: 5 (ref 5.0–8.0)

## 2023-09-30 LAB — BASIC METABOLIC PANEL WITH GFR
Anion gap: 9 (ref 5–15)
BUN: 27 mg/dL — ABNORMAL HIGH (ref 8–23)
CO2: 22 mmol/L (ref 22–32)
Calcium: 9.9 mg/dL (ref 8.9–10.3)
Chloride: 110 mmol/L (ref 98–111)
Creatinine, Ser: 1.28 mg/dL — ABNORMAL HIGH (ref 0.61–1.24)
GFR, Estimated: 50 mL/min — ABNORMAL LOW (ref >60)
Glucose, Bld: 118 mg/dL — ABNORMAL HIGH (ref 70–99)
Potassium: 3.6 mmol/L (ref 3.5–5.1)
Sodium: 141 mmol/L (ref 135–145)

## 2023-09-30 NOTE — ED Provider Notes (Signed)
 [place] EMERGENCY DEPARTMENT AT [facility] Provider Note   CSN: [id number] Arrival date & time: 09/30/23  9975     Patient presents with: Leg Pain   Manuel Lewis is a 89 y.o. male.   The history is limited by the condition of the patient (level 5 dementia).  Leg Pain Location:  Leg Leg location:  R lower leg, L lower leg, R upper leg and L upper leg Pain details:    Quality:  Cramping   Radiates to:  Does not radiate   Severity:  Moderate   Onset quality:  Sudden   Timing:  Constant   Progression:  Resolved Chronicity:  New Prior injury to area:  No Relieved by:  Nothing Worsened by:  Nothing Ineffective treatments:  None tried Associated symptoms: no fever   Risk factors: no concern for non-accidental trauma        Prior to Admission medications   Medication Sig Start Date End Date Taking? Authorizing Provider  amLODipine  (NORVASC ) 5 MG tablet Take 5 mg by mouth daily. 03/31/19   [provider]  CALCIUM-VITAMIN D PO Take 1 tablet by mouth daily.    [provider]  carboxymethylcellulose (REFRESH PLUS) 0.5 % SOLN Place 1 drop into both eyes daily as needed.    [provider]  Cyanocobalamin (VITAMIN B-12 PO) Take 1 capsule by mouth daily.    [provider]  donepezil  (ARICEPT ) 10 MG tablet Take 10 mg by mouth at bedtime. 04/11/22   [provider]  memantine  (NAMENDA ) 5 MG tablet Take 5 mg by mouth 2 (two) times daily. 07/31/20   [provider]  methylPREDNISolone  (MEDROL  DOSEPAK) 4 MG TBPK tablet Take per packet instructions 07/10/23   Overturf, Jackson L, PA-C  Rivaroxaban  (XARELTO ) 15 MG TABS tablet Take 1 tablet (15 mg total) by mouth daily with supper. 09/24/23   Gretta Lonni PARAS, MD  SYMBICORT 80-4.5 MCG/ACT inhaler SMARTSIG:1 Puff(s) Via Inhaler Every 4 Hours PRN    [provider]  telmisartan (MICARDIS) 80 MG tablet Take 80 mg by mouth daily.    [provider]     Allergies: Alendronate sodium and Tamsulosin     Review of Systems  Unable to perform ROS: Dementia  Constitutional:  Negative for fever.  Respiratory:  Negative for wheezing and stridor.   Cardiovascular:  Negative for leg swelling.  Gastrointestinal:  Negative for constipation.  Musculoskeletal:  Positive for myalgias.    Updated Vital Signs BP (!) 140/112   Pulse 62   Temp 98.3 F (36.8 C) (Axillary)   Resp 16   SpO2 98%   Physical Exam Vitals and nursing note reviewed.  Constitutional:      General: He is not in acute distress.    Appearance: He is well-developed. He is not diaphoretic.  HENT:     Head: Normocephalic and atraumatic.     Nose: Nose normal.  Eyes:     Conjunctiva/sclera: Conjunctivae normal.     Pupils: Pupils are equal, round, and reactive to light.  Cardiovascular:     Rate and Rhythm: Normal rate and regular rhythm.     Pulses: Normal pulses.     Heart sounds: Normal heart sounds.  Pulmonary:     Effort: Pulmonary effort is normal.     Breath sounds: Normal breath sounds. No wheezing or rales.  Abdominal:     General: Bowel sounds are normal.     Palpations: Abdomen is soft.     Tenderness:  There is no abdominal tenderness. There is no guarding or rebound.  Musculoskeletal:        General: No tenderness. Normal range of motion.     Cervical back: Normal range of motion and neck supple.     Right lower leg: No edema.     Left lower leg: No edema.     Comments: Negative Homan sign of BLE,  intact dorsalis pedis B  Skin:    General: Skin is warm and dry.     Capillary Refill: Capillary refill takes less than 2 seconds.  Neurological:     Mental Status: He is alert.     Deep Tendon Reflexes: Reflexes normal.  Psychiatric:        Mood and Affect: Mood normal.     (all labs ordered are listed, but only abnormal results are displayed) Results for orders placed or performed during the hospital encounter of 09/30/23  CBC with Differential    Collection Time: 09/30/23 12:37 AM  Result Value Ref Range   WBC 6.3 4.0 - 10.5 K/uL   RBC 3.34 (L) 4.22 - 5.81 MIL/uL   Hemoglobin 10.8 (L) 13.0 - 17.0 g/dL   HCT 67.3 (L) 60.9 - 47.9 %   MCV 97.6 80.0 - 100.0 fL   MCH 32.3 26.0 - 34.0 pg   MCHC 33.1 30.0 - 36.0 g/dL   RDW 86.6 88.4 - 84.4 %   Platelets 228 150 - 400 K/uL   nRBC 0.0 0.0 - 0.2 %   Neutrophils Relative % 69 %   Neutro Abs 4.4 1.7 - 7.7 K/uL   Lymphocytes Relative 17 %   Lymphs Abs 1.1 0.7 - 4.0 K/uL   Monocytes Relative 10 %   Monocytes Absolute 0.6 0.1 - 1.0 K/uL   Eosinophils Relative 2 %   Eosinophils Absolute 0.1 0.0 - 0.5 K/uL   Basophils Relative 1 %   Basophils Absolute 0.1 0.0 - 0.1 K/uL   Immature Granulocytes 1 %   Abs Immature Granulocytes 0.03 0.00 - 0.07 K/uL  Basic metabolic panel   Collection Time: 09/30/23 12:37 AM  Result Value Ref Range   Sodium 141 135 - 145 mmol/L   Potassium 3.6 3.5 - 5.1 mmol/L   Chloride 110 98 - 111 mmol/L   CO2 22 22 - 32 mmol/L   Glucose, Bld 118 (H) 70 - 99 mg/dL   BUN 27 (H) 8 - 23 mg/dL   Creatinine, Ser 8.71 (H) 0.61 - 1.24 mg/dL   Calcium 9.9 8.9 - 89.6 mg/dL   GFR, Estimated 50 (L) >60 mL/min   Anion gap 9 5 - 15  Urinalysis, Routine w reflex microscopic -Urine, Clean Catch   Collection Time: 09/30/23 12:37 AM  Result Value Ref Range   Color, Urine YELLOW YELLOW   APPearance CLEAR CLEAR   Specific Gravity, Urine 1.015 1.005 - 1.030   pH 5.0 5.0 - 8.0   Glucose, UA NEGATIVE NEGATIVE mg/dL   Hgb urine dipstick NEGATIVE NEGATIVE   Bilirubin Urine NEGATIVE NEGATIVE   Ketones, ur NEGATIVE NEGATIVE mg/dL   Protein, ur 899 (A) NEGATIVE mg/dL   Nitrite NEGATIVE NEGATIVE   Leukocytes,Ua NEGATIVE NEGATIVE   RBC / HPF 0-5 0 - 5 RBC/hpf   WBC, UA 0-5 0 - 5 WBC/hpf   Bacteria, UA NONE SEEN NONE SEEN   Squamous Epithelial / HPF 0-5 0 - 5 /HPF   CUP PACEART REMOTE DEVICE CHECK Result Date: 09/21/2023 PPM scheduled remote reviewed. Normal device function.  Presenting rhythm: VP Next remote 91 days. AB, CVRS    Radiology: No results found.   Procedures   Medications Ordered in the ED - No data to display                                  Medical Decision Making Patient with leg cramps family would like DVT study   Amount and/or Complexity of Data Reviewed Independent Historian: guardian    Details: See above  External Data Reviewed: notes.    Details: Previous notes reviewed  Labs: ordered.    Details: Urine negative for UTI.  Normal sodium 141 and potassium 3.6, elevated creatinine 1.28. Normal white count hemoglobin slight low 10.8  Risk Risk Details: Well appearing.  Legs are non tender.  FROM.  No cords, no swelling.  DVT study ordered.  Stable for discharge with close follow up.       Final diagnoses:  Leg cramps    No signs of systemic illness or infection. The patient is nontoxic-appearing on exam and vital signs are within normal limits.  I have reviewed the triage vital signs and the nursing notes. Pertinent labs & imaging results that were available during my care of the patient were reviewed by me and considered in my medical decision making (see chart for details). After history, exam, and medical workup I feel the patient has been appropriately medically screened and is safe for discharge home. Pertinent diagnoses were discussed with the patient. Patient was given return precautions.    ED Discharge Orders          Ordered    LE Venous       Comments: IMPORTANT PATIENT INSTRUCTIONS: You have been scheduled for an Outpatient Vascular Study at [facility].  If tomorrow is a Saturday, Sunday or holiday, please go to the [facility] Emergency Department Registration Desk at 11 am tomorrow morning and tell them you are there for a vascular study.  If tomorrow is a weekday (Monday-Friday), please go to the [facility] (address [address], [place]) at 8 am and report to the  4th floor registration Zone A.  Inform registration that you are there for a vascular study.   09/30/23 0116               Bret Stamour, MD 09/30/23 9746

## 2023-09-30 NOTE — ED Triage Notes (Signed)
 Pt bib[facility] from home c/o bilateral cramping in calves, left leg worse then other. No current c/o pain.   Bp 172/74 Hr 96  18 RR  97% RA  124 cbg

## 2023-12-16 ENCOUNTER — Ambulatory Visit (INDEPENDENT_AMBULATORY_CARE_PROVIDER_SITE_OTHER): Payer: Medicare Other

## 2023-12-16 DIAGNOSIS — I739 Peripheral vascular disease, unspecified: Secondary | ICD-10-CM

## 2023-12-21 LAB — CUP PACEART REMOTE DEVICE CHECK
Battery Remaining Longevity: 77 mo
Battery Voltage: 2.98 V
Brady Statistic RV Percent Paced: 99.95 %
Date Time Interrogation Session: 20251003181606
Implantable Lead Connection Status: 753985
Implantable Lead Implant Date: 20110531
Implantable Lead Location: 753860
Implantable Lead Model: 4471
Implantable Lead Serial Number: 484940
Implantable Pulse Generator Implant Date: 20200508
Lead Channel Impedance Value: 285 Ohm
Lead Channel Impedance Value: 361 Ohm
Lead Channel Pacing Threshold Amplitude: 1 V
Lead Channel Pacing Threshold Pulse Width: 0.4 ms
Lead Channel Sensing Intrinsic Amplitude: 5.75 mV
Lead Channel Sensing Intrinsic Amplitude: 5.75 mV
Lead Channel Setting Pacing Amplitude: 2.5 V
Lead Channel Setting Pacing Pulse Width: 0.4 ms
Lead Channel Setting Sensing Sensitivity: 1.2 mV
Zone Setting Status: 755011

## 2023-12-22 NOTE — Progress Notes (Signed)
 Remote PPM Transmission

## 2023-12-24 NOTE — Progress Notes (Signed)
 Remote PPM Transmission

## 2023-12-30 ENCOUNTER — Ambulatory Visit: Payer: Self-pay | Admitting: Cardiology

## 2024-03-16 ENCOUNTER — Encounter

## 2024-06-15 ENCOUNTER — Encounter

## 2024-09-14 ENCOUNTER — Encounter

## 2024-12-14 ENCOUNTER — Encounter
